# Patient Record
Sex: Female | Born: 1960 | Hispanic: No | Smoking: Current every day smoker
Health system: Southern US, Community
[De-identification: ages and names within clinical notes are randomized; demographics above are authoritative.]

## PROBLEM LIST (undated history)

## (undated) DIAGNOSIS — Z780 Asymptomatic menopausal state: Secondary | ICD-10-CM

## (undated) DIAGNOSIS — I82409 Acute embolism and thrombosis of unspecified deep veins of unspecified lower extremity: Secondary | ICD-10-CM

## (undated) DIAGNOSIS — C3490 Malignant neoplasm of unspecified part of unspecified bronchus or lung: Secondary | ICD-10-CM

## (undated) HISTORY — PX: IVC FILTER PLACEMENT (ARMC HX): HXRAD1551

## (undated) HISTORY — DX: Asymptomatic menopausal state: Z78.0

## (undated) HISTORY — PX: BREAST CYST EXCISION: SHX579

---

## 2012-11-22 ENCOUNTER — Ambulatory Visit: Payer: Self-pay | Admitting: Physical Medicine and Rehabilitation

## 2012-12-20 ENCOUNTER — Inpatient Hospital Stay: Payer: Self-pay | Admitting: Internal Medicine

## 2012-12-20 ENCOUNTER — Ambulatory Visit: Payer: Self-pay | Admitting: Emergency Medicine

## 2012-12-20 LAB — COMPREHENSIVE METABOLIC PANEL
Albumin: 3.9 g/dL (ref 3.4–5.0)
Alkaline Phosphatase: 151 U/L — ABNORMAL HIGH (ref 50–136)
Anion Gap: 7 (ref 7–16)
BUN: 4 mg/dL — ABNORMAL LOW (ref 7–18)
Bilirubin,Total: 0.4 mg/dL (ref 0.2–1.0)
Chloride: 104 mmol/L (ref 98–107)
Creatinine: 0.67 mg/dL (ref 0.60–1.30)
Glucose: 102 mg/dL — ABNORMAL HIGH (ref 65–99)
Osmolality: 276 (ref 275–301)
Potassium: 4.1 mmol/L (ref 3.5–5.1)
Sodium: 140 mmol/L (ref 136–145)
Total Protein: 7.7 g/dL (ref 6.4–8.2)

## 2012-12-20 LAB — CBC
HCT: 40.6 % (ref 35.0–47.0)
HGB: 14 g/dL (ref 12.0–16.0)
MCHC: 34.6 g/dL (ref 32.0–36.0)
MCV: 100 fL (ref 80–100)
Platelet: 228 10*3/uL (ref 150–440)
RBC: 4.05 10*6/uL (ref 3.80–5.20)
RDW: 12 % (ref 11.5–14.5)

## 2012-12-20 LAB — TSH: Thyroid Stimulating Horm: 2.09 u[IU]/mL

## 2012-12-20 LAB — PROTIME-INR: INR: 0.9

## 2012-12-21 LAB — HEPATIC FUNCTION PANEL A (ARMC)
Albumin: 3.3 g/dL — ABNORMAL LOW (ref 3.4–5.0)
Bilirubin, Direct: 0.2 mg/dL (ref 0.00–0.20)
Bilirubin,Total: 0.5 mg/dL (ref 0.2–1.0)
SGOT(AST): 49 U/L — ABNORMAL HIGH (ref 15–37)
SGPT (ALT): 70 U/L (ref 12–78)
Total Protein: 6.5 g/dL (ref 6.4–8.2)

## 2012-12-23 ENCOUNTER — Ambulatory Visit: Payer: Self-pay | Admitting: Physical Medicine and Rehabilitation

## 2012-12-28 ENCOUNTER — Ambulatory Visit: Payer: Self-pay | Admitting: Vascular Surgery

## 2012-12-28 LAB — BASIC METABOLIC PANEL WITH GFR
Anion Gap: 8
BUN: 7 mg/dL
Calcium, Total: 8.9 mg/dL
Chloride: 109 mmol/L — ABNORMAL HIGH
Co2: 25 mmol/L
Creatinine: 0.64 mg/dL
EGFR (African American): >60
EGFR (Non-African Amer.): >60
Glucose: 98 mg/dL
Osmolality: 281
Potassium: 3.5 mmol/L
Sodium: 142 mmol/L

## 2013-01-02 ENCOUNTER — Ambulatory Visit: Payer: Self-pay | Admitting: Hematology and Oncology

## 2013-01-20 ENCOUNTER — Ambulatory Visit: Payer: Self-pay | Admitting: Hematology and Oncology

## 2013-01-20 ENCOUNTER — Ambulatory Visit: Payer: Self-pay | Admitting: Physical Medicine and Rehabilitation

## 2013-03-19 ENCOUNTER — Ambulatory Visit: Payer: Self-pay | Admitting: Hematology and Oncology

## 2013-03-20 LAB — CBC CANCER CENTER
Basophil %: 1.1 %
Eosinophil #: 0.2 x10 3/mm (ref 0.0–0.7)
HGB: 15.4 g/dL (ref 12.0–16.0)
Lymphocyte #: 3.3 x10 3/mm (ref 1.0–3.6)
Lymphocyte %: 41.8 %
Monocyte #: 0.5 x10 3/mm (ref 0.2–0.9)
Neutrophil %: 48 %
RBC: 4.69 10*6/uL (ref 3.80–5.20)
WBC: 7.9 x10 3/mm (ref 3.6–11.0)

## 2013-03-20 LAB — COMPREHENSIVE METABOLIC PANEL
Alkaline Phosphatase: 109 U/L (ref 50–136)
Anion Gap: 11 (ref 7–16)
Chloride: 105 mmol/L (ref 98–107)
Co2: 27 mmol/L (ref 21–32)
Creatinine: 0.8 mg/dL (ref 0.60–1.30)
EGFR (Non-African Amer.): >60
Glucose: 108 mg/dL — ABNORMAL HIGH (ref 65–99)
Potassium: 4.1 mmol/L (ref 3.5–5.1)
Sodium: 143 mmol/L (ref 136–145)
Total Protein: 7.5 g/dL (ref 6.4–8.2)

## 2013-03-22 ENCOUNTER — Ambulatory Visit: Payer: Self-pay | Admitting: Hematology and Oncology

## 2013-06-14 ENCOUNTER — Ambulatory Visit: Payer: Self-pay | Admitting: Hematology and Oncology

## 2013-06-19 LAB — BASIC METABOLIC PANEL
Anion Gap: 10 (ref 7–16)
BUN: 13 mg/dL (ref 7–18)
Creatinine: 0.9 mg/dL (ref 0.60–1.30)
EGFR (African American): >60
EGFR (Non-African Amer.): >60
Osmolality: 285 (ref 275–301)
Sodium: 143 mmol/L (ref 136–145)

## 2013-06-19 LAB — CBC CANCER CENTER
Eosinophil #: 0.1 x10 3/mm (ref 0.0–0.7)
Eosinophil %: 1.9 %
HCT: 38 % (ref 35.0–47.0)
HGB: 13.4 g/dL (ref 12.0–16.0)
Lymphocyte %: 33 %
MCHC: 35.2 g/dL (ref 32.0–36.0)
Monocyte #: 0.5 x10 3/mm (ref 0.2–0.9)
Monocyte %: 8.2 %
Neutrophil #: 3.2 x10 3/mm (ref 1.4–6.5)
Neutrophil %: 55.9 %
Platelet: 210 x10 3/mm (ref 150–440)
RDW: 12.6 % (ref 11.5–14.5)
WBC: 5.8 x10 3/mm (ref 3.6–11.0)

## 2013-06-22 ENCOUNTER — Ambulatory Visit: Payer: Self-pay | Admitting: Hematology and Oncology

## 2013-09-18 ENCOUNTER — Ambulatory Visit: Payer: Self-pay | Admitting: Hematology and Oncology

## 2013-09-18 LAB — CBC CANCER CENTER
Basophil #: 0.1 x10 3/mm (ref 0.0–0.1)
Basophil %: 1.5 %
Eosinophil %: 4.4 %
HGB: 14 g/dL (ref 12.0–16.0)
Lymphocyte #: 1.9 x10 3/mm (ref 1.0–3.6)
Lymphocyte %: 36.7 %
MCV: 102 fL — ABNORMAL HIGH (ref 80–100)
Monocyte #: 0.5 x10 3/mm (ref 0.2–0.9)
Monocyte %: 9.6 %
Neutrophil #: 2.5 x10 3/mm (ref 1.4–6.5)
Neutrophil %: 47.8 %
Platelet: 216 x10 3/mm (ref 150–440)
RBC: 4.12 10*6/uL (ref 3.80–5.20)
RDW: 12 % (ref 11.5–14.5)
WBC: 5.2 x10 3/mm (ref 3.6–11.0)

## 2013-09-18 LAB — BASIC METABOLIC PANEL
BUN: 11 mg/dL (ref 7–18)
Chloride: 104 mmol/L (ref 98–107)
Co2: 24 mmol/L (ref 21–32)
EGFR (Non-African Amer.): >60
Glucose: 122 mg/dL — ABNORMAL HIGH (ref 65–99)
Potassium: 4.1 mmol/L (ref 3.5–5.1)
Sodium: 141 mmol/L (ref 136–145)

## 2013-09-22 ENCOUNTER — Ambulatory Visit: Payer: Self-pay | Admitting: Hematology and Oncology

## 2015-03-14 NOTE — Consult Note (Signed)
Brief Consult Note: Diagnosis: extensive DVT left Lower extremity.   Comments: Would recommend routine age appropriate screening for malignancy including mammogram,colonoscopy and agree with CT abdomen as liver function tests continue to be abnormal. also agree with hypercoaguable workup including Factor V leiden,Prothrombin gene mutation and Lupus anticoagulant. Consult appreciated.  Electronic Signatures: Georges Mouse (MD)  (Signed 30-Jan-14 16:05)  Authored: Brief Consult Note   Last Updated: 30-Jan-14 16:05 by Georges Mouse (MD)

## 2015-03-14 NOTE — Consult Note (Signed)
CHIEF COMPLAINT and HISTORY:   Subjective/Chief Complaint left leg pain/swelling    History of Present Illness Patient presents with 5 day history of pain and swelling of the left leg.  No antecedent trauma/long trips/personal or family history of thrombotic issues.  No right leg symptoms.  Has no significant improvement with two doses of lovenox.  US shows extensive DVT LLE throughout visualized veins.    Past History none   ALLERGIES:  Allergies:  Penicillin: Hives  HOME MEDICATIONS:  Home Medications: Medication Instructions Status  ibuprofen 200 mg oral tablet 1-2 tab(s) orally every 4 to 6 hours, As Needed- for Pain  Active  multivitamin 1 tab(s) orally once a day Active  Flax Seed Oil oral capsule 1 cap(s) orally once a day Active  Vitamin D3 1 tab(s) orally once a day Active   Family and Social History:   Family History Non-Contributory  no history of clotting issues    Social History negative ETOH, negative Illicit drugs    Place of Living Home   Review of Systems:   Subjective/Chief Complaint left leg pain/swelling    Fever/Chills No    Cough No    Sputum No    Abdominal Pain No    Diarrhea No    Constipation No    Nausea/Vomiting No    SOB/DOE No    Chest Pain No    Dysuria No    Tolerating Diet Yes    Medications/Allergies Reviewed Medications/Allergies reviewed   Physical Exam:   GEN well developed, well nourished, no acute distress    HEENT pink conjunctivae, hearing intact to voice    NECK No masses    RESP normal resp effort  no use of accessory muscles    CARD regular rate  no JVD    ABD denies tenderness  denies Flank Tenderness    LYMPH negative neck, negative axillae    EXTR 2+ LLE edema.  Edema present up to the thigh    SKIN normal to palpation, skin turgor good    NEURO motor/sensory function intact    PSYCH A+O to time, place, person    Additional Comments normal pedal pulses bilaterally   LABS:  Laboratory  Results: Thyroid:    29-Jan-14 17:40, Thyroid Stimulating Hormone   Thyroid Stimulating Hormone 2.09   0.45-4.50  (International Unit)   -----------------------  Pregnant patients have   different reference   ranges for TSH:   - - - - - - - - - -   Pregnant, first trimetser:   0.36 - 2.50 uIU/mL  Hepatic:    29-Jan-14 17:40, Comprehensive Metabolic Panel   Bilirubin, Total 0.4   Alkaline Phosphatase 151   SGPT (ALT) 96   SGOT (AST) 98   Total Protein, Serum 7.7   Albumin, Serum 3.9    30-Jan-14 06:14, Hepatic Function Panel A   Bilirubin, Total 0.5   Bilirubin, Direct 0.2   Result(s) reported on 21 Dec 2012 at 06:53AM.   Alkaline Phosphatase 121   SGPT (ALT) 70   SGOT (AST) 49   Total Protein, Serum 6.5   Albumin, Serum 3.3  Routine Chem:    29-Jan-14 17:40, Comprehensive Metabolic Panel   Glucose, Serum 102   BUN 4   Creatinine (comp) 0.67   Sodium, Serum 140   Potassium, Serum 4.1   Chloride, Serum 104   CO2, Serum 29   Calcium (Total), Serum 9.0   Osmolality (calc) 276   eGFR (African American) >60   eGFR (  Non-African American) >60   eGFR values <56m/min/1.73 m2 may be an indication of chronic  kidney disease (CKD).  Calculated eGFR is useful in patients with stable renal function.  The eGFR calculation will not be reliable in acutely ill patients  when serum creatinine is changing rapidly. It is not useful in   patients on dialysis. The eGFR calculation may not be applicable  to patients at the low and high extremes of body sizes, pregnant  women, and vegetarians.   Anion Gap 7  Routine Coag:    29-Jan-14 17:40, Activated PTT   Activated PTT (APTT) 25.2   A HCT value >55% may artifactually increase the APTT. In one study,  the increase was an average of 19%.  Reference: "Effect on Routine and Special Coagulation Testing Values  of Citrate Anticoagulant Adjustment in Patients with High HCT Values."  American Journal of Clinical Pathology  2006;126:400-405.    29-Jan-14 17:40, Prothrombin Time   Prothrombin 12.9   INR 0.9   INR reference interval applies to patients on anticoagulant therapy.  A single INR therapeutic range for coumarins is not optimal for all  indications; however, the suggested range for most indications is  2.0 - 3.0.  Exceptions to the INR Reference Range may include: Prosthetic heart  valves, acute myocardial infarction, prevention of myocardial  infarction, and combinations of aspirin and anticoagulant. The need  for a higher or lower target INR must be assessed individually.  Reference: The Pharmacology and Management of the Vitamin K   antagonists: the seventh ACCP Conference on Antithrombotic and  Thrombolytic Therapy. CEZMOQ.9476Sept:126 (3suppl): [id number]  A HCT value >55% may artifactually increase the PT.  In one study,   the increase was an average of 25%.  Reference:  "Effect on Routine and Special Coagulation Testing Values  of Citrate Anticoagulant Adjustment in Patients with High HCT Values."  American Journal of Clinical Pathology 2006;126:400-405.  Routine Hem:    29-Jan-14 17:40, Hemogram, Platelet Count   WBC (CBC) 10.9   RBC (CBC) 4.05   Hemoglobin (CBC) 14.0   Hematocrit (CBC) 40.6   Platelet Count (CBC) 228   Result(s) reported on 20 Dec 2012 at 05:53PM.   MCV 100   MCH 34.7   MCHC 34.6   RDW 12.0   RADIOLOGY:  Radiology Results: XRay:    29-Jan-14 21:16, Chest PA and Lateral   Chest PA and Lateral   REASON FOR EXAM:    dvt left leg  COMMENTS:       PROCEDURE: DXR - DXR CHEST PA (OR AP) AND LATERAL  - Dec 20 2012  9:16PM     RESULT: The lungs are hyperinflated. There is no focal infiltrate. There   is no pneumothorax or pneumomediastinum. The cardiac silhouette is normal   in size. The mediastinum is normal in width.    IMPRESSION:  There is hyperinflation consistent with COPD. There is no   evidence of CHF nor pneumonia. There are no findings to suggest a    pulmonary infarction.     Dictation Site: 5    Verified By: DAVID A. JMartinique M.D., MD  [place]    29-Jan-14 16:47, [place] Flow Doppler Low Extrem Left   [place] Flow Doppler Low Extrem Left   REASON FOR EXAM:    CALL [id number] Left leg pain and swelling   Eval for DVT Dr RJoneen Caraway...  COMMENTS:       PROCEDURE: [place] - [place]OPPLER LOW  Princella Ion LEFT  - Dec 20 2012  4:47PM     RESULT: Left lower extremity deep venous Doppler ultrasound dated   12/20/2012.    Technique: Real time sonographic imaging of the deep venous structures of   the left lower extremity was obtained. Representative static images were   provided for interpretation.    Findings: Diffuse noncompressible increased echogenicity is appreciated   within the common femoral vein extending into the saphenous vein,     superficial femoral vein as well as the popliteal vein. There is distal   extension into the tibial and peroneal veins. There is no evidence of   color filling within these vessels nor evidence of an appreciable   waveform.    IMPRESSION:  Extensive occlusive thrombus within the interrogated deep   venous structures of the left lower extremity. Dr. Joneen Caraway the patient's   attending physician was informed of these findings at 4:47 p.m. On   12/20/2012. (*)        Verified By: Mikki Santee, M.D., MD  LabUnknown:   PACS Image    29-Jan-14 21:16, Chest PA and Lateral   PACS Image   ASSESSMENT AND PLAN:   Assessment/Admission Diagnosis extensive LLE DVT, no previous history    Plan Discussed multiple options for treatment.   Highly likely to have post-phlebitic problems with pain and swelling long term given significant symtpoms and severe clot burden.  Discussed that catheter directed thrombolysis and thrombectomy would likley reduce these symptoms by about 50%.  Risks of bleeding, renal failure, access complications discussed.  Would place IVC filter at that time.  Have up to two to three weeks  from start of symptoms to perform this procedure, and she is interested in having this done next week.  Will arrange as an outpatient.  Continue full anticoagulation.   Electronic Signatures: Algernon Huxley (MD)  (Signed 30-Jan-14 16:33)  Authored: Chief Complaint and History, ALLERGIES, HOME MEDICATIONS, Family and Social History, Review of Systems, Physical Exam, LABS, RADIOLOGY, Assessment and Plan   Last Updated: 30-Jan-14 16:33 by Algernon Huxley (MD)

## 2015-03-14 NOTE — Discharge Summary (Signed)
PATIENT NAME:  Christina Mckee, Christina Mckee MR#:  [id number] DATE OF BIRTH:  [birth year]  DATE OF ADMISSION:  12/20/2012 DATE OF DISCHARGE:  12/22/2012  ADMITTING DIAGNOSIS: Deep vein thrombosis.  DISCHARGE DIAGNOSES:  1. Extensive left lower extremity deep vein thrombosis status post IVC filter placement by Dr. Delana Meyer on the 31st of January 2014.   2. Elevated transaminases of unclear etiology at this time. Unremarkable CT of abdomen and pelvis. Most likely hepatic hemangioma. 3. Hyperglycemia with hemoglobin A1c 5.4. 4. Anxious state.   DISCHARGE CONDITION: Stable.   DISCHARGE MEDICATIONS:  1. Multivitamins once daily. 2. Flaxseed oil 1 capsule once daily. 3. Vitamin D3 unknown dose, 1 tablet once daily. 4. Xarelto 15 p.o. twice daily for 20 days and then 20 mg daily until completing 3 month therapy.   NOTE: The patient is not to take ibuprofen.   HOME OXYGEN: None.   DIET: Regular, regular consistency.   ACTIVITY LIMITATIONS: As tolerated.   FOLLOWUP APPOINTMENTS: With [facility] in 2 days after discharge. Also Dr. Lucky Cowboy in 1 week after discharge and Dr. Kallie Edward in 1 week after discharge.   CONSULTANTS:  1. Hortencia Pilar, MD. 2. Care Management.  3. Leotis Pain, MD. 4. Kennieth Francois, MD.  RADIOLOGIC STUDIES: Chest, PA and lateral, 29th of January 2014, showed hyperinflation consistent with COPD. No evidence of CHF or pneumonia. There are no findings to suggest pulmonary infarction.  Ultrasound of left lower extremity, 29th of January 2014, revealed extensive occluded thrombus within the internal deep vein structures, of the left lower extremity, including common femoral vein extending to saphenous vein, superficial femoral vein as well as popliteal vein. There is distal extension into the tibial and peroneal veins.  CT scan of abdomen and pelvis with contrast, 30th of January 2014, due to elevated transaminases as well as DVT in young person, revealed abnormal appearance of left  inguinal region and proximal lower extremity deep vein structures, on the left. Correlate for thrombophlebitis. No focal hepatic mass or ductal dilatation evident aside from what appears to be possible hemangioma.  HISTORY OF PRESENT ILLNESS: The patient is a 54 year old Caucasian female with no significant past medical history who presented to the hospital on the 29th of January 2014 with left lower extremity pain as well as swelling. Please refer to Dr. Edward Jolly admission note on the 29th of January 2014.  On arrival to the hospital, the patient's temperature was 98.7, pulse was 108, respiratory rate was 20, blood pressure 135/84 and saturation was 98% on room air. Physical examination was unremarkable, except of left lower extremity pitting edema, approximately 1 to 2+. Good peripheral pulses were noted.   The patient's lab data done on the 29th of January 2014 revealed mild elevation of glucose to 102. Otherwise BMP was normal. The patient's liver enzymes showed alkaline phosphatase elevated to 151. AST as well as ALT were 98 and 96, respectively. TSH was normal at 2.09. CBC: White blood cell count was 10.9, hemoglobin was 14.0 and platelet count 228. Coagulation panel was unremarkable. The patient's EKG showed normal sinus rhythm at 99 beats per minute, normal axis, no acute ST-T changes were noted. The patient had ultrasound of her lower extremity which showed extensive DVT.   HOSPITAL COURSE: She was admitted to the hospital for further evaluation. She was evaluated by Dr. Kallie Edward, oncologist, as well as Dr. Leotis Pain. Dr. Leotis Pain felt that the patient would benefit from thrombectomy and he recommended followup with him in the next one week  after discharge. However, the patient was very concerned about her left lower extremity DVT and she was pressed by her family members to get IVC filter for which she was very much agreeable. Dr. Delana Meyer evaluated the patient later in her stay, on 31st of January  2014, and she had IVC filter placed with no significant complications. She was discharged on the same day, 31st of January 2014, in stable condition with the above-mentioned medications and followup. Her vital signs on the day of discharge: Temperature was 97.8, pulse was 80, respiratory rate was 18, blood pressure 105/70 and saturation 98% on room air at rest.   In regards to elevated transaminases, the patient had CT scan of abdomen and pelvis, however, that was unremarkable, except for hepatic hemangioma. The patient's liver enzymes were rechecked the next day and she was noted to have mild elevation of AST of unclear etiology. It is recommended to follow the patient's liver enzymes and rule out alcohol abuse issues.   The patient was noted to be hyperglycemic on her initial arrival to the hospital labs. Hemoglobin A1c was checked and was found to be 5.4. The patient was evaluated by oncologist in regards to her hypercoagulable state. She had multiple studies done such as anticardiolipin antibodies, beta 2 glycoprotein, as well as hepatitis panels A, B and C, as well as CNS functional testing. However, those results were not available at the time of the patient's discharge. Now we have results only from hepatitis panels A, B and C, which were negative. Protein S functional was 71%. The patient is to follow up with Dr. Kallie Edward in regards to management of her questionable hypercoagulable state.   TIME SPENT: 40 minutes. ____________________________ Theodoro Grist, MD rv:sb D: 12/24/2012 18:45:00 ET T: 12/25/2012 10:00:25 ET JOB#: [id number]  cc: Theodoro Grist, MD, <Dictator> [facility] Algernon Huxley, MD Rae Halsted. Kallie Edward, MD Theodoro Grist MD ELECTRONICALLY SIGNED 01/11/2013 19:13

## 2015-03-14 NOTE — Op Note (Signed)
PATIENT NAME:  Christina Mckee, HOOT MR#:  [id number] DATE OF BIRTH:  10-Nov-1961  DATE OF PROCEDURE:  12/28/2012  PREOPERATIVE DIAGNOSES:  1.  Extensive left lower extremity deep vein thrombosis.  2.  Status post inferior vena cava filter placement.   POSTOPERATIVE DIAGNOSES: 1.  Extensive left lower extremity deep vein thrombosis.  2.  Status post inferior vena cava filter placement.   PROCEDURE:  1.  Ultrasound guidance for vascular access, left popliteal vein.  2.  Catheter placement into inferior vena cava from left popliteal vein.  3.  Left lower extremity venogram and inferior venacavogram.  4.  Infusion of a total of 16 mg of TPA with the trial Trellis system in the left superficial femoral vein, common femoral vein, external and common iliac veins and inferior vena cava.  5.  Venous thrombectomy with the Trellis and AngioJet systems to same veins.  6.  Percutaneous transluminal angioplasty with 10 mm diameter angioplasty balloon in the left common femoral vein, external and common iliac veins and inferior vena cava.   SURGEON: Algernon Huxley, M.D.   ANESTHESIA: Local with moderate conscious sedation.   ESTIMATED BLOOD LOSS: Minimal.   FLUOROSCOPY TIME: Approximately 9 minutes with 14 mL of contrast used.   INDICATION FOR PROCEDURE: The patient has extensive left lower extremity DVT with severe pain and swelling. Venous thrombolysis performed to help improve her symptoms from her DVT both in the immediate time and long-term. Risks and benefits were discussed. Informed consent was obtained.   DESCRIPTION OF PROCEDURE: The patient is brought to the vascular interventional radiology suite. The left lower extremity was sterilely prepped and draped and a sterile surgical field was created. She was placed prone and her left popliteal vein was visualized with ultrasound found with thrombosed and was found to be thrombosed. It was able to be accessed without difficulty with a micropuncture needle,  micropuncture wire and sheath were placed. Imaging showed thrombosis of the popliteal superficial femoral veins without much blood flow proximally. I was able to pass a wire and catheter up into the inferior vena cava where inferior venacavogram was performed. This demonstrated thrombus into the inferior vena cava. There was thrombus throughout the common femoral and iliac venous system as well. The Magic torque wire was placed. The patient was given 3000 units of intravenous heparin. The Trellis system was then brought onto the field. Two runs of 8 mg of TPA were used; the first run was from the left common femoral vein to the inferior vena cava using the Trellis thrombectomy system and deployed in TPA. It was then pulled back and encompassed the left common femoral vein and then the proximal mid superficial femoral vein 8 more mg of TPA were infused in this location. The Trellis system was used. This showed improvement of flow, but still significant thrombus and stenosis present. The most difficult area across was in the left iliac vein. This may be May-Thurner situation, although it was difficult to tell. Balloon angioplasty was performed with a 10 mm diameter angioplasty balloon from the left common femoral vein up into the inferior vena cava and then the AngioJet proxy system was used from the superficial femoral vein up to the vena cava. At this point, there was a channel of blood with markedly improved flow. There were still some residual thrombus and this was significantly better than prior and I elected to terminate the procedure. The sheath was removed. Pressure was held. Sterile dressing was placed. The patient tolerated the  procedure well and was taken to the recovery room in stable condition. .    ____________________________ Algernon Huxley, MD jsd:aw D: 12/28/2012 10:25:12 ET T: 12/28/2012 10:32:56 ET JOB#: [id number]  cc: Algernon Huxley, MD, <Dictator> Algernon Huxley MD ELECTRONICALLY SIGNED  01/08/2013 16:56

## 2015-03-14 NOTE — H&P (Signed)
PATIENT NAME:  Christina Mckee, Christina Mckee MR#:  [id number] DATE OF BIRTH:  1961-10-13  DATE OF ADMISSION:  12/20/2012  PRIMARY CARE PHYSICIAN: Does not have one.   CHIEF COMPLAINT: Left lower extremity pain and swelling.   HISTORY OF PRESENT ILLNESS: This is a 54 year old female who presents to the hospital with 4- to 5-day history of left lower extremity pain and swelling. The patient thought it was related to some cramps or pulled muscle. She tried taking some ibuprofen and some muscle relaxants. Although it was not improving her symptoms, she even tried taking Epsom salts baths, but it did not improve her symptoms. She, therefore, came to the ER, underwent a left lower extremity ultrasound, which showed an acute DVT. Hospitalist services were contacted for further treatment and evaluation. The patient denies any chest pain. She denies any shortness of breath, any nausea, vomiting, headache, fevers, chills, cough or any other associated symptoms presently.   REVIEW OF SYSTEMS: CONSTITUTIONAL: No documented fever. No weight gain or weight loss.  EYES:  No blurry or double vision.  ENT: No tinnitus. No postnasal drip. No redness of oropharynx.  RESPIRATORY: No cough. No wheeze. No hemoptysis.  CARDIOVASCULAR: No chest pain. No orthopnea. No palpitations. No syncope.  GASTROINTESTINAL: No nausea, vomiting or diarrhea. No abdominal pain. No melena. No hematochezia.  GENITOURINARY: No dysuria or hematuria.  ENDOCRINE: No polyuria or nocturia. No heat or cold intolerance. HEMATOLOGIC:  No anemia. No bruising. No bleeding.  INTEGUMENTARY: No rashes. No lesions.  MUSCULOSKELETAL: No arthritis. No swelling. No gout.  NEUROLOGIC: No numbness or tingling. No ataxia. No seizure-type activity.  PSYCHIATRIC: No anxiety. No insomnia. No ADD.   PAST MEDICAL HISTORY:  The patient has no significant past medical history.   ALLERGIES: PENICILLIN, WHICH CAUSES HIVES.   SOCIAL HISTORY: She does smoke about a half a  pack per day, has been smoking for the past 20+ years. Occasional alcohol use. No illicit drug abuse. Lives at home with her children.   FAMILY HISTORY: Father is alive, just has high blood pressure. Mother died from complications of breast cancer.   CURRENT MEDICATIONS:  She is currently taking multivitamin daily, flaxseed oil 1 tab daily, vitamin D3 at 400 international units daily and ibuprofen 200 mg 1 to 2 tabs every 4 hours as needed.   PHYSICAL EXAMINATION: VITAL SIGNS: Temperature 98.7, pulse 108, respirations 20, blood pressure 135/84, saturations 98% on room air.  GENERAL: She is a pleasant but anxious-appearing female in no apparent distress.  HEENT: Atraumatic, normocephalic. Extraocular muscles are intact. Pupils equal, reactive to light. Sclerae is anicteric. No conjunctival injection. No pharyngeal erythema.  NECK: Supple. There is no jugular venous distention. No bruits. No lymphadenopathy. No thyromegaly.  HEART: Regular rate and rhythm, tachycardic. No murmurs, rubs or clicks.  LUNGS: Clear to auscultation bilaterally. No rales. No rhonchi. No wheezes.  ABDOMEN: Soft, flat, nontender, nondistended. Has good bowel sounds. No hepatosplenomegaly appreciated.  EXTREMITIES: No evidence of any cyanosis or clubbing. She does have +1 to 2 pitting edema on the left lower extremity as compared to the right. The left lower extremity is a bit warmer to touch than the right. She has +2 pedal and radial pulses bilaterally.  NEUROLOGIC: She is alert, awake, oriented x3 with no focal motor or sensory deficits appreciated bilaterally.  SKIN: Moist, warm, with no rashes appreciated.  LYMPHATIC: There is no cervical or axillary lymphadenopathy.  BREASTS:  Did not show any evidence of focal masses or nodularity or any  evidence of any deformity.   LABORATORY DATA: Serum glucose 102, BUN 4, creatinine 0.6, sodium 140, potassium 4.1, chloride 104, bicarbonate 29, albumin 3.9, alkaline phosphatase 151,  AST 98, ALT 96. Her white cell count is 10.9, hemoglobin 14, hematocrit 40.6, platelet count 228, INR 0.9.   The patient did have an ultrasound of the left lower extremity, which showed extensive occlusive thrombus within interrogated deep venous structures to left lower extremity.   ASSESSMENT AND PLAN: This is a 54 year old female, with no significant past medical history, who was not seen a doctor in many years, presents to the hospital with left lower extremity swelling and pain and noted to have an acute left lower extremity deep vein thrombosis.  1.  Left lower extremity deep vein thrombosis. Her risk factors presently are currently unclear. She has not been on a long car or plane ride. She has no history of malignancy. She does smoke. The only risk factor is her being somewhat on a laptop for hours searching for a job. Has not had any history of spontaneous abortions during her early childhood. I will go ahead and check a thrombophilia workup for now. We will start her on Lovenox. She likely will need to be on treat anywhere from 3 to 6 months. She does not have a primary care physician; this will likely need to be set up through case management prior to discharge.  2.  Elevated liver function tests. I will go ahead and check a hepatitis profile. Repeat her LFTs in the morning.   CODE:  The patient is a full code.   TIME SPENT ON ADMISSION: 45 minutes.     ____________________________ Belia Heman. Verdell Carmine, MD vjs:cc D: 12/20/2012 21:13:00 ET T: 12/20/2012 22:20:42 ET JOB#: [id number]  cc: Belia Heman. Verdell Carmine, MD, <Dictator> Henreitta Leber MD ELECTRONICALLY SIGNED 12/23/2012 8:12

## 2015-03-14 NOTE — Consult Note (Signed)
History of Present Illness:   Reason for Consult Extensive idiopathic thrombosis LLE    Date of Diagnosis 18-Dec-2012    HPI   this is a 54 year old female with a newly diagnosed extensive left lower extremity thrombosis with no obvious risk factors.  No history of preceding trauma and no history of previous DVT.  Patient does not smoke.  She has been sitting at her computer for long periods of time over the past few months while she has been job hunting.family history of bleeding or clotting disorders.medical history of note.  Patient has not had any routine health screening and she has not had any medical insurance.  No colonoscopy or mammogram has been done.  Has not had any recent Pap smears  PFSH:   Family History positive, mother died of breast cancer    Social History negative alcohol, positive tobacco, 1/2 pack per day   Review of Systems:   General pain    Performance Status (ECOG) 2    Extremities swelling  pain  LLE    Psych no complaints   NURSING NOTES: **Vital Signs.:   31-Jan-14 04:35    Vital Signs Type: Routine    Temperature Temperature (F): 98.9    Temperature Source: Oral    Pulse Pulse: 91    Respirations Respirations: 20    Systolic BP Systolic BP: 96    Diastolic BP (mmHg) Diastolic BP (mmHg): 56    Mean BP: 69    Pulse Ox % Pulse Ox %: 97    Pulse Ox Activity Level: At rest    Oxygen Delivery: Room Air/ 21 %   Physical Exam:   General middle aged female in no acute distress    HEENT: normal    Lungs: clear    Cardiac: regular rate, rhythm    Breast: not examined    Abdomen: nontender  positive bowel sounds    Skin: intact    Extremities: edema  swollen LLE    Neuro: AAOx3    Psych: normal appearance  alert and cooperative    Penicillin: Hives  Radiology Results: XRay:    29-Jan-14 21:16, Chest PA and Lateral   Chest PA and Lateral    REASON FOR EXAM:    dvt left leg  COMMENTS:       PROCEDURE: DXR - DXR CHEST PA  (OR AP) AND LATERAL  - Dec 20 2012  9:16PM     RESULT: The lungs are hyperinflated. There is no focal infiltrate. There   is no pneumothorax or pneumomediastinum. The cardiac silhouette is normal   in size. The mediastinum is normal in width.    IMPRESSION:  There is hyperinflation consistent with COPD. There is no   evidence of CHF nor pneumonia. There are no findings to suggest a   pulmonary infarction.     Dictation Site: 5    Verified By: DAVID A. Martinique, M.D., MD  [place]:    29-Jan-14 16:47, [place] Color Flow Doppler Low Extrem Left   [place] Color Flow Doppler Low Extrem Left    REASON FOR EXAM:    CALL [id number]  Left leg pain and swelling   Eval for DVT Dr Joneen Caraway ...  COMMENTS:       PROCEDURE: [place]  - US DOPPLER LOW EXTR LEFT  - Dec 20 2012  4:47PM     RESULT: Left lower extremity deep venous Doppler ultrasound dated   12/20/2012.    Technique: Real time sonographic imaging of the  deep venous structures of   the left lower extremity was obtained. Representative static images were   provided for interpretation.    Findings: Diffuse noncompressible increased echogenicity is appreciated   within the common femoral vein extending into the saphenous vein,     superficial femoral vein as well as the popliteal vein. There is distal   extension into the tibial and peroneal veins. There is no evidence of   color filling within these vessels nor evidence of an appreciable   waveform.    IMPRESSION:  Extensive occlusive thrombus within the interrogated deep   venous structures of the left lower extremity. Dr. Joneen Caraway the patient's   attending physician was informed of these findings at 4:47 p.m. On   12/20/2012. (*)        Verified By: Mikki Santee, M.D., MD  CT:    30-Jan-14 18:22, CT Abdomen and Pelvis With Contrast   CT Abdomen and Pelvis With Contrast    REASON FOR EXAM:    (1) elevated transaminases, r/o abnormalities,   resembling masses, as pt is in hy  COMMENTS:        PROCEDURE: CT  - CT ABDOMEN / PELVIS  W  - Dec 21 2012  6:22PM     RESULT: CT of the abdomen and pelvis is performed utilizing 116m of   Isovue-300 iodinated intravenous contrast and oral contrast with images   reconstructed at 3 mm slice thickness in the axial plane.    The lung bases are clear. There is no pleural or pericardial effusion.   There is abnormal ill-defined density in the left inguinal region   adjacent to the artery and vein. Correlate for underlying left lower   extremity deep vein thrombosis or thrombophlebitis. Left lower extremity   Doppler interrogation may be beneficial.  There is no ductal dilation. There is a enhancing area in the right   hepatic lobe which may or present a hemangioma given the pattern of   enhancement. This measures 7 mm on image 19. No postcontrast delayed   images were performed.  The common bile duct is unremarkable. The   gallbladder is nondistended. No pancreatic mass or ductal dilation is   evident. The spleen enhances normally and is not enlarged. The kidneys   show no obstruction. The adrenal glands are unremarkable. The abdominal   aorta is normal in caliber. Thereis no mesenteric or retroperitoneal   mass or adenopathy. A normal appearing retrocecal appendix is   demonstrated. There is no evidence of abscess or ascites. The uterus   appears to be unremarkable. No adnexal masses appreciated. The urinary   bladder appears unremarkable for the somewhat low volume of distention.   The small bowel is unremarkable.    IMPRESSION:   1. Abnormal appearance in the left inguinal region and proximal lower   extremity deep venous structures on the left. Correlate forof   thrombophlebitis.  2. No focal hepatic mass or ductal dilation evident aside from what   appears to be a possible hemangioma as mentioned above.    Dictation Site: 1        Verified By: GSundra Aland M.D., MD       Oncology Consult, ROUTINE - requires  response within 24 hours  Notify-on call  Reason for-yound woman with extensive DVT  Note:notified office 12/21/12 1424. GLF  Message to MD-Consult given to Dr. RKallie Edward1/30/2014 at 2:30pm  Notified Physician Office on 21-Dec-2012 at 14:24  RGeorges Mouseadded  as a consulting care provider, 21-Dec-2012, Pending, Standard  Assessment and Plan:  Impression:   this is a 10-old female with extensive  idiopathic left lower extremity clot.  Plan:   In terms of  extensive clot burden patient has been evaluated by vascular surgery and will undergo thrombectomy within the next week.terms of anticoagulation,patient on Xarelto. May need to be on Lovenox prior to surgical procedure.will need a full hypercoagulable workup.  She has had labs drawn for factor V Leiden prothrombin  gene mutation and lupus anticoagulant.will also need routine health  screening including colonoscopy and mammogram.scan also showed a left inguinal density which should be further evaluated.will follow outpatient on discharge.appreciated  Electronic Signatures: Georges Mouse (MD)  (Signed 31-Jan-14 13:22)  Authored: HISTORY OF PRESENT ILLNESS, PFSH, ROS, NURSING NOTES, PE, ALLERGIES, HOME MEDICATIONS, OTHER RESULTS, ORDERS, ASSESSMENT AND PLAN   Last Updated: 31-Jan-14 13:22 by Georges Mouse (MD)

## 2015-03-14 NOTE — Op Note (Signed)
PATIENT NAME:  Christina Mckee, SIGNER MR#:  [id number] DATE OF BIRTH:  Mar 09, 1961  DATE OF PROCEDURE:  12/22/2012  PREOPERATIVE DIAGNOSES: Deep venous thrombosis, left lower extremity.   POSTOPERATIVE DIAGNOSIS: Deep venous thrombosis, left lower extremity.   PROCEDURES PERFORMED:  1. Inferior venacavogram.  2. Placement of infrarenal inferior vena caval filter.   PROCEDURE PERFORMED BY: Katha Cabal, MD.   SEDATION Versed 2 mg plus fentanyl 50 mcg administered IV. Continuous ECG, pulse oximetry and cardiopulmonary monitoring is performed throughout the entire procedure by the Interventional Radiology nurse. Total sedation time was 30 minutes.   ACCESS: A 9.5 French sheath, right common femoral vein.   FLUOROSCOPY TIME: Less than 1 minute.   CONTRAST USED: Isovue 20 mL.   INDICATIONS: The patient is a 54 year old woman who had sustained a left lower extremity DVT. She will undergo lysis next week and is undergoing placement of an IVC filter at this time. The risks and benefits were reviewed. All questions are answered. The patient agrees to proceed.   DESCRIPTION OF PROCEDURE: The patient is taken to Special Procedures and placed in the supine position. After adequate sedation is achieved, the right groin is prepped and draped in a sterile fashion. Ultrasound is placed in a sterile sleeve. Ultrasound is utilized secondary to lack of appropriate landmarks and to avoid vascular injury. Under direct ultrasound visualization, the femoral vein is identified. It is echolucent, homogeneous, indicating patency. Image is recorded for the permanent record. Then 1% lidocaine is then infiltrated into the soft tissue and Seldinger needle is utilized to access the vein, J-wire is advanced. The sheath is then advanced, and a hand injection of contrast is utilized to demonstrate the inferior vena cava. After review of the images, there are no filling defects. The cava measures approximately 20 mm and a Meridian  filter is deployed at the L2 level in excellent orientation. The sheath is pulled, pressure is held, and a safeguard is applied. There is no immediate complication.   INTERPRETATION: Inferior vena cava is opacified with a bolus injection of contrast. Reflux of contrast into the renal veins is noted. They are located at the top of the L2 level. The filter is deployed at the lower margin of L2 level.  ____________________________ Katha Cabal, MD ggs:jm D: 12/22/2012 18:28:25 ET T: 12/23/2012 15:10:15 ET JOB#: [id number]  cc: Katha Cabal, MD, <Dictator> Katha Cabal MD ELECTRONICALLY SIGNED 01/08/2013 23:25

## 2016-03-05 ENCOUNTER — Emergency Department: Payer: BLUE CROSS/BLUE SHIELD

## 2016-03-05 ENCOUNTER — Inpatient Hospital Stay
Admission: EM | Admit: 2016-03-05 | Discharge: 2016-03-06 | DRG: 871 | Disposition: A | Discharge status: Home / Self Care | Payer: BLUE CROSS/BLUE SHIELD | Attending: Internal Medicine | Admitting: Internal Medicine

## 2016-03-05 ENCOUNTER — Encounter: Payer: Self-pay | Admitting: Emergency Medicine

## 2016-03-05 DIAGNOSIS — Z800 Family history of malignant neoplasm of digestive organs: Secondary | ICD-10-CM | POA: Diagnosis not present

## 2016-03-05 DIAGNOSIS — R918 Other nonspecific abnormal finding of lung field: Secondary | ICD-10-CM | POA: Diagnosis present

## 2016-03-05 DIAGNOSIS — E876 Hypokalemia: Secondary | ICD-10-CM | POA: Diagnosis present

## 2016-03-05 DIAGNOSIS — I10 Essential (primary) hypertension: Secondary | ICD-10-CM | POA: Diagnosis present

## 2016-03-05 DIAGNOSIS — R6510 Systemic inflammatory response syndrome (SIRS) of non-infectious origin without acute organ dysfunction: Secondary | ICD-10-CM

## 2016-03-05 DIAGNOSIS — Z86718 Personal history of other venous thrombosis and embolism: Secondary | ICD-10-CM | POA: Diagnosis not present

## 2016-03-05 DIAGNOSIS — F17210 Nicotine dependence, cigarettes, uncomplicated: Secondary | ICD-10-CM | POA: Diagnosis not present

## 2016-03-05 DIAGNOSIS — Z880 Allergy status to penicillin: Secondary | ICD-10-CM

## 2016-03-05 DIAGNOSIS — J209 Acute bronchitis, unspecified: Secondary | ICD-10-CM | POA: Diagnosis present

## 2016-03-05 DIAGNOSIS — A419 Sepsis, unspecified organism: Secondary | ICD-10-CM | POA: Diagnosis present

## 2016-03-05 DIAGNOSIS — R000 Tachycardia, unspecified: Secondary | ICD-10-CM

## 2016-03-05 DIAGNOSIS — K760 Fatty (change of) liver, not elsewhere classified: Secondary | ICD-10-CM | POA: Diagnosis present

## 2016-03-05 DIAGNOSIS — Z806 Family history of leukemia: Secondary | ICD-10-CM

## 2016-03-05 DIAGNOSIS — J9600 Acute respiratory failure, unspecified whether with hypoxia or hypercapnia: Secondary | ICD-10-CM | POA: Diagnosis present

## 2016-03-05 DIAGNOSIS — J189 Pneumonia, unspecified organism: Secondary | ICD-10-CM | POA: Diagnosis present

## 2016-03-05 DIAGNOSIS — K449 Diaphragmatic hernia without obstruction or gangrene: Secondary | ICD-10-CM | POA: Diagnosis present

## 2016-03-05 DIAGNOSIS — Z95828 Presence of other vascular implants and grafts: Secondary | ICD-10-CM

## 2016-03-05 DIAGNOSIS — Z803 Family history of malignant neoplasm of breast: Secondary | ICD-10-CM | POA: Diagnosis not present

## 2016-03-05 HISTORY — DX: Acute embolism and thrombosis of unspecified deep veins of unspecified lower extremity: I82.409

## 2016-03-05 LAB — CBC WITH DIFFERENTIAL/PLATELET
BASOS PCT: 1 %
Basophils Absolute: 0.2 10*3/uL — ABNORMAL HIGH (ref 0–0.1)
EOS ABS: 0 10*3/uL (ref 0–0.7)
EOS PCT: 0 %
HCT: 37.1 % (ref 35.0–47.0)
HEMOGLOBIN: 12.4 g/dL (ref 12.0–16.0)
Lymphocytes Relative: 3 %
Lymphs Abs: 0.9 10*3/uL — ABNORMAL LOW (ref 1.0–3.6)
MCH: 31.1 pg (ref 26.0–34.0)
MCHC: 33.4 g/dL (ref 32.0–36.0)
MCV: 93.1 fL (ref 80.0–100.0)
MONOS PCT: 7 %
Monocytes Absolute: 1.8 10*3/uL — ABNORMAL HIGH (ref 0.2–0.9)
NEUTROS PCT: 89 %
Neutro Abs: 24 10*3/uL — ABNORMAL HIGH (ref 1.4–6.5)
PLATELETS: 364 10*3/uL (ref 150–440)
RBC: 3.99 MIL/uL (ref 3.80–5.20)
RDW: 13.1 % (ref 11.5–14.5)
WBC: 26.8 10*3/uL — ABNORMAL HIGH (ref 3.6–11.0)

## 2016-03-05 LAB — URINALYSIS COMPLETE WITH MICROSCOPIC (ARMC ONLY)
BILIRUBIN URINE: NEGATIVE
GLUCOSE, UA: NEGATIVE mg/dL
HGB URINE DIPSTICK: NEGATIVE
KETONES UR: NEGATIVE mg/dL
LEUKOCYTES UA: NEGATIVE
NITRITE: NEGATIVE
PH: 7 (ref 5.0–8.0)
Protein, ur: NEGATIVE mg/dL
RBC / HPF: NONE SEEN RBC/hpf (ref 0–5)
Specific Gravity, Urine: 1.024 (ref 1.005–1.030)
WBC, UA: NONE SEEN WBC/hpf (ref 0–5)

## 2016-03-05 LAB — TROPONIN I

## 2016-03-05 LAB — TSH: TSH: 2.379 u[IU]/mL (ref 0.350–4.500)

## 2016-03-05 LAB — COMPREHENSIVE METABOLIC PANEL
ALBUMIN: 3.5 g/dL (ref 3.5–5.0)
ALK PHOS: 74 U/L (ref 38–126)
ALT: 19 U/L (ref 14–54)
ANION GAP: 15 (ref 5–15)
AST: 28 U/L (ref 15–41)
BUN: 10 mg/dL (ref 6–20)
CHLORIDE: 100 mmol/L — AB (ref 101–111)
CO2: 19 mmol/L — AB (ref 22–32)
Calcium: 9.3 mg/dL (ref 8.9–10.3)
Creatinine, Ser: 0.65 mg/dL (ref 0.44–1.00)
GFR calc non Af Amer: >60 mL/min (ref >60)
GLUCOSE: 155 mg/dL — AB (ref 65–99)
POTASSIUM: 3.4 mmol/L — AB (ref 3.5–5.1)
SODIUM: 134 mmol/L — AB (ref 135–145)
Total Bilirubin: 0.8 mg/dL (ref 0.3–1.2)
Total Protein: 7.1 g/dL (ref 6.5–8.1)

## 2016-03-05 LAB — LIPASE, BLOOD: Lipase: 15 U/L (ref 11–51)

## 2016-03-05 LAB — BRAIN NATRIURETIC PEPTIDE: B NATRIURETIC PEPTIDE 5: 44 pg/mL (ref 0.0–100.0)

## 2016-03-05 LAB — LACTIC ACID, PLASMA: Lactic Acid, Venous: 1.3 mmol/L (ref 0.5–2.0)

## 2016-03-05 MED ORDER — SODIUM CHLORIDE 0.9% FLUSH: 3.0000 mL | Freq: Two times a day (BID) | INTRAVENOUS | Status: DC

## 2016-03-05 MED ORDER — HYDROCODONE-ACETAMINOPHEN 5-325 MG PO TABS
1.0000 | ORAL_TABLET | ORAL | Status: DC | PRN
Start: 2016-03-05 — End: 2016-03-06

## 2016-03-05 MED ORDER — ACETAMINOPHEN 650 MG RE SUPP: 650.0000 mg | Freq: Four times a day (QID) | RECTAL | Status: DC | PRN

## 2016-03-05 MED ORDER — POTASSIUM CHLORIDE CRYS ER 20 MEQ PO TBCR
40.0000 meq | EXTENDED_RELEASE_TABLET | Freq: Once | ORAL | Status: AC
  Administered 2016-03-05: 40 meq via ORAL
  Filled 2016-03-05: qty 2

## 2016-03-05 MED ORDER — ENOXAPARIN SODIUM 40 MG/0.4ML ~~LOC~~ SOLN
40.0000 mg | SUBCUTANEOUS | Status: DC
  Filled 2016-03-05: qty 0.4

## 2016-03-05 MED ORDER — BENZONATATE 100 MG PO CAPS
200.0000 mg | ORAL_CAPSULE | Freq: Three times a day (TID) | ORAL | Status: DC
  Administered 2016-03-05: 18:00:00 200 mg via ORAL
  Filled 2016-03-05 (×2): qty 2

## 2016-03-05 MED ORDER — METOPROLOL TARTRATE 25 MG PO TABS
25.0000 mg | ORAL_TABLET | Freq: Two times a day (BID) | ORAL | Status: DC
  Administered 2016-03-05: 25 mg via ORAL
  Filled 2016-03-05 (×2): qty 1

## 2016-03-05 MED ORDER — GUAIFENESIN-CODEINE 100-10 MG/5ML PO SOLN
5.0000 mL | Freq: Four times a day (QID) | ORAL | Status: DC | PRN
  Administered 2016-03-05: 5 mL via ORAL
  Filled 2016-03-05: qty 5

## 2016-03-05 MED ORDER — SODIUM CHLORIDE 0.9 % IV BOLUS (SEPSIS)
1000.0000 mL | Freq: Once | INTRAVENOUS | Status: AC
  Administered 2016-03-05: 1000 mL via INTRAVENOUS

## 2016-03-05 MED ORDER — SODIUM CHLORIDE 0.9 % IV SOLN
INTRAVENOUS | Status: DC
  Administered 2016-03-05 – 2016-03-06 (×2): via INTRAVENOUS

## 2016-03-05 MED ORDER — ONDANSETRON HCL 4 MG PO TABS: 4.0000 mg | ORAL_TABLET | Freq: Four times a day (QID) | ORAL | Status: DC | PRN

## 2016-03-05 MED ORDER — IOPAMIDOL (ISOVUE-370) INJECTION 76%
100.0000 mL | Freq: Once | INTRAVENOUS | Status: AC | PRN
Start: 2016-03-05 — End: 2016-03-05
  Administered 2016-03-05: 100 mL via INTRAVENOUS

## 2016-03-05 MED ORDER — NICOTINE 21 MG/24HR TD PT24
21.0000 mg | MEDICATED_PATCH | Freq: Every day | TRANSDERMAL | Status: DC
  Administered 2016-03-05: 21 mg via TRANSDERMAL
  Filled 2016-03-05: qty 1

## 2016-03-05 MED ORDER — IPRATROPIUM-ALBUTEROL 0.5-2.5 (3) MG/3ML IN SOLN: 3.0000 mL | Freq: Four times a day (QID) | RESPIRATORY_TRACT | Status: DC | PRN

## 2016-03-05 MED ORDER — ONDANSETRON HCL 4 MG/2ML IJ SOLN: 4.0000 mg | Freq: Four times a day (QID) | INTRAMUSCULAR | Status: DC | PRN

## 2016-03-05 MED ORDER — MORPHINE SULFATE (PF) 2 MG/ML IV SOLN: 2.0000 mg | INTRAVENOUS | Status: DC | PRN

## 2016-03-05 MED ORDER — PANTOPRAZOLE SODIUM 40 MG PO TBEC
40.0000 mg | DELAYED_RELEASE_TABLET | Freq: Every day | ORAL | Status: DC
  Administered 2016-03-05: 40 mg via ORAL
  Filled 2016-03-05: qty 1

## 2016-03-05 MED ORDER — PNEUMOCOCCAL VAC POLYVALENT 25 MCG/0.5ML IJ INJ: 0.5000 mL | INJECTION | INTRAMUSCULAR | Status: DC

## 2016-03-05 MED ORDER — LEVOFLOXACIN IN D5W 750 MG/150ML IV SOLN
750.0000 mg | INTRAVENOUS | Status: DC
  Administered 2016-03-05: 750 mg via INTRAVENOUS
  Filled 2016-03-05 (×2): qty 150

## 2016-03-05 MED ORDER — MOMETASONE FURO-FORMOTEROL FUM 200-5 MCG/ACT IN AERO
2.0000 | INHALATION_SPRAY | Freq: Two times a day (BID) | RESPIRATORY_TRACT | Status: DC
  Filled 2016-03-05: qty 8.8

## 2016-03-05 MED ORDER — ACETAMINOPHEN 325 MG PO TABS: 650.0000 mg | ORAL_TABLET | Freq: Four times a day (QID) | ORAL | Status: DC | PRN

## 2016-03-05 NOTE — Significant Event (Signed)
Patient request information on Advanced Directives following possible cancer diagnosis.  Chaplain provided education, gave forms and reviewed.  Also offered supportive listening, space for patient's story and opportunity for spiritual expression. Will make note for CC chaplain to follow up.

## 2016-03-05 NOTE — Consult Note (Signed)
Pharmacy Antibiotic Note  Christina Mckee is a 55 y.o. female admitted on 03/05/2016 with postobustructive PNA.  Pharmacy has been consulted for levofloxacin dosing.  Plan: levofloxacin '750mg'$  q 24 hours  Height: '5\' 5"'$  (165.1 cm) Weight: 120 lb (54.432 kg) IBW/kg (Calculated) : 57  Temp (24hrs), Avg:98.6 F (37 C), Min:98.6 F (37 C), Max:98.6 F (37 C)   Recent Labs Lab 03/05/16 1137 03/05/16 1337  WBC 26.8*  --   CREATININE 0.65  --   LATICACIDVEN  --  1.3    Estimated Creatinine Clearance: 67.4 mL/min (by C-G formula based on Cr of 0.65).    Allergies  Allergen Reactions  . Penicillins Hives and Other (See Comments)    Has patient had a PCN reaction causing immediate rash, facial/tongue/throat swelling, SOB or lightheadedness with hypotension: No Has patient had a PCN reaction causing severe rash involving mucus membranes or skin necrosis: No Has patient had a PCN reaction that required hospitalization No Has patient had a PCN reaction occurring within the last 10 years: No If all of the above answers are "NO", then may proceed with Cephalosporin use.    Antimicrobials this admission: levofloxacin 4/14 >>    Dose adjustments this admission:   Microbiology results: 4/14 BCx: pending   Thank you for allowing pharmacy to be a part of this patient's care.  Ramond Dial, Pharm.D Clinical Pharmacist  03/05/2016 3:09 PM

## 2016-03-05 NOTE — ED Notes (Signed)
Patient c/o "constant tickle to back of throat, constant cough, and gas".  Patient finished a Z-Pack for URI about 2 weeks ago.

## 2016-03-05 NOTE — H&P (Signed)
[facility] NAME: Sonna Lipsky    MR#:  [id number]  DATE OF BIRTH:  01-Mar-1961  DATE OF ADMISSION:  03/05/2016  PRIMARY CARE PHYSICIAN: No PCP Per Patient   REQUESTING/REFERRING PHYSICIAN: Dr. Delman Kitten  CHIEF COMPLAINT:   Chief Complaint  Patient presents with  . Cough  . Sore Throat    HISTORY OF PRESENT ILLNESS:  Tieasha Larsen  is a 55 y.o. female with a known history of unprovoked DVT in 2014, treated with Xarelto and currently off of anticoagulation presents to the hospital secondary to generalized weakness, dry cough and fatigue going on for almost 2 weeks now. Patient was followed after discharge from her DVT hospitalization in 2014 and was seeing a hematologist at the time. Chest x-ray during that time did not reveal any masses. 2 weeks ago when her symptoms started, she went to urgent care and was started on CPAP. She felt better initially and then her symptoms started getting worse. She was complaining of heartburn and chest pressure, dry cough, generalized weakness and fatigue all the time. Denies any loss of weight. But complains of loss of appetite and loss of sleep. She does continue to smoke about half pack to 1 pack per day and has been smoking for more than 38 years now. CT of the chest here in the emergency room revealed a large left lower lobe mass encasing her lower lobe bronchi and postobstructive pneumonia. She is being admitted for the same. Patient is very adamant about not staying more than a day if not needed and wants to get most workup done as outpatient like biopsy if it cannot be done over the weekend.  PAST MEDICAL HISTORY:   Past Medical History  Diagnosis Date  . DVT (deep venous thrombosis) ([place])     in 2015    PAST SURGICAL HISTORY:   Past Surgical History  Procedure Laterality Date  . Cesarean section    . Ivc filter placement ([facility] hx)    . Breast cyst excision      left breast  removal    SOCIAL HISTORY:   Social History  Substance Use Topics  . Smoking status: Current Every Day Smoker -- 0.50 packs/day    Types: Cigarettes  . Smokeless tobacco: Never Used     Comment: 1/2 PPD to 1PPD  . Alcohol Use: 0.0 oz/week    0 Standard drinks or equivalent per week     Comment: daily about 4 beers/day    FAMILY HISTORY:   Family History  Problem Relation Age of Onset  . Breast cancer Mother   . Breast cancer Maternal Aunt   . Stomach cancer Paternal Uncle   . Leukemia Paternal Uncle     DRUG ALLERGIES:   Allergies  Allergen Reactions  . Penicillins Hives and Other (See Comments)    Has patient had a PCN reaction causing immediate rash, facial/tongue/throat swelling, SOB or lightheadedness with hypotension: No Has patient had a PCN reaction causing severe rash involving mucus membranes or skin necrosis: No Has patient had a PCN reaction that required hospitalization No Has patient had a PCN reaction occurring within the last 10 years: No If all of the above answers are "NO", then may proceed with Cephalosporin use.    REVIEW OF SYSTEMS:   Review of Systems  Constitutional: Positive for chills and malaise/fatigue. Negative for fever and weight loss.  HENT: Negative for ear discharge, ear pain, nosebleeds and tinnitus.  Eyes: Negative for blurred vision, double vision and photophobia.  Respiratory: Positive for cough. Negative for hemoptysis, shortness of breath and wheezing.   Cardiovascular: Negative for chest pain, palpitations, orthopnea and leg swelling.       Chest tightness  Gastrointestinal: Positive for heartburn and abdominal pain. Negative for nausea, vomiting, diarrhea, constipation and melena.  Genitourinary: Negative for dysuria, urgency and frequency.  Musculoskeletal: Negative for back pain and neck pain.  Skin: Negative for rash.  Neurological: Negative for dizziness, sensory change, speech change, focal weakness and headaches.   Endo/Heme/Allergies: Does not bruise/bleed easily.  Psychiatric/Behavioral: Negative for depression.    MEDICATIONS AT HOME:   Prior to Admission medications   Not on File      VITAL SIGNS:  Blood pressure 144/90, pulse 118, temperature 98.6 F (37 C), temperature source Oral, resp. rate 19, height '5\' 5"'$  (1.651 m), weight 54.432 kg (120 lb), SpO2 97 %.  PHYSICAL EXAMINATION:   Physical Exam  GENERAL:  55 y.o.-year-old patient lying in the bed with no acute distress.  EYES: Pupils equal, round, reactive to light and accommodation. No scleral icterus. Extraocular muscles intact.  HEENT: Head atraumatic, normocephalic. Oropharynx and nasopharynx clear.  NECK:  Supple, no jugular venous distention. No thyroid enlargement, no tenderness.  LUNGS: Normal breath sounds bilaterally, scattered wheezing, few upper lobe rhonchi. No rales  or crepitation. No use of accessory muscles of respiration.  CARDIOVASCULAR: S1, S2 normal. No murmurs, rubs, or gallops.  ABDOMEN: Soft, nontender, nondistended. Bowel sounds present. No organomegaly or mass.  EXTREMITIES: No pedal edema, cyanosis, or clubbing.  NEUROLOGIC: Cranial nerves II through XII are intact. Muscle strength 5/5 in all extremities. Sensation intact. Gait not checked.  PSYCHIATRIC: The patient is alert and oriented x 3.  SKIN: No obvious rash, lesion, or ulcer.   LABORATORY PANEL:   CBC  Recent Labs Lab 03/05/16 1137  WBC 26.8*  HGB 12.4  HCT 37.1  PLT 364   ------------------------------------------------------------------------------------------------------------------  Chemistries   Recent Labs Lab 03/05/16 1137  NA 134*  K 3.4*  CL 100*  CO2 19*  GLUCOSE 155*  BUN 10  CREATININE 0.65  CALCIUM 9.3  AST 28  ALT 19  ALKPHOS 74  BILITOT 0.8   ------------------------------------------------------------------------------------------------------------------  Cardiac Enzymes  Recent Labs Lab  03/05/16 1137  TROPONINI <0.03   ------------------------------------------------------------------------------------------------------------------  RADIOLOGY:  Ct Angio Chest Pe W/cm &/or Wo Cm  03/05/2016  CLINICAL DATA:  Chronic cough and chronic abdomen and pelvic pain. The pain is in the upper abdomen, increasing over the past several months. Left leg swelling. Clinical concern for deep venous thrombosis. EXAM: CT ANGIOGRAPHY CHEST CT ABDOMEN AND PELVIS WITH CONTRAST TECHNIQUE: Multidetector CT imaging of the chest was performed using the standard protocol during bolus administration of intravenous contrast. Multiplanar CT image reconstructions and MIPs were obtained to evaluate the vascular anatomy. Multidetector CT imaging of the abdomen and pelvis was performed using the standard protocol during bolus administration of intravenous contrast. CONTRAST:  100 cc Isovue 370 COMPARISON:  Abdomen and pelvis CT dated 11/24/2012. Bilateral lower extremity venous Doppler examination obtained earlier today. FINDINGS: CTA CHEST FINDINGS Normally opacified pulmonary arteries with no pulmonary arterial filling defects seen. No enlarged lymph nodes. There is an oval mass in the medial aspect of the superior segment of the left lower lobe, encasing the left lower lobe pulmonary arteries and veins. There is diffuse narrowing of the left lower lobe pulmonary vein. On image number 76, this mass measures 5.7 x  3.3 cm. This measures 8.7 cm in length on sagittal image number 88. On coronal image number 68, this is seen extending into the inferior, medial aspect of the left upper lobe, posteriorly. This extends into the left hilum and is partially encasing the proximal left lower lobe bronchus, with mild to moderate diffuse narrowing of the bronchus. There is adjacent patchy airspace consolidation in the left lower lobe. The right lung is clear. A 4 mm nodule was demonstrated in the posterior aspect of the left upper lobe  on image number 66. There are also several small areas of nodularity in the posterior aspect of the left lower lobe on image number 121. The largest of these is linear in configuration and measures 10 mm in maximum diameter on that image. There are 3 small adjacent nodules in the left lower lobe on image number 104, the largest measuring 4 mm. No pleural fluid.  Normal sized heart.  Small hiatal hernia. Minimal thoracic spine degenerative changes. No evidence of bony metastatic disease. CT ABDOMEN and PELVIS FINDINGS Diffuse low density of the liver. Normal appearing gallbladder. Unremarkable spleen, pancreas, adrenal glands, left kidney, ureters and urinary bladder. Small right renal cysts. Inferior vena cava filter. No deep venous thrombosis visualized. No gastrointestinal abnormalities or enlarged lymph nodes. Normal appearing appendix. Unremarkable uterus and ovaries. Disc space narrowing and discogenic sclerosis at the L1-2 level. Hemangiomas in the L3 and L4 vertebral bodies. No evidence of bony metastatic disease. Review of the MIP images confirms the above findings. IMPRESSION: 1. 8.7 x 5.7 x 3.3 cm mass in the medial aspect of the superior segment of the left lower lobe, it extending into the medial aspect of the left upper lobe and into the left hilum, with vascular and bronchial bronchial encasement and narrowing. 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 2. Multiple small left lung nodules, potentially metastases. 3. Diffuse hepatic steatosis. 4. Small sliding hiatal hernia. 5. No evidence of malignancy or metastatic disease in the abdomen or pelvis. These results were called by telephone at the time of interpretation on 03/05/2016 at 1:51 pm to Dr. Delman Kitten , who verbally acknowledged these results. Electronically Signed   By: Claudie Revering M.D.   On: 03/05/2016 13:51   Ct Abdomen Pelvis W Contrast  03/05/2016  CLINICAL DATA:  Chronic cough and chronic abdomen and  pelvic pain. The pain is in the upper abdomen, increasing over the past several months. Left leg swelling. Clinical concern for deep venous thrombosis. EXAM: CT ANGIOGRAPHY CHEST CT ABDOMEN AND PELVIS WITH CONTRAST TECHNIQUE: Multidetector CT imaging of the chest was performed using the standard protocol during bolus administration of intravenous contrast. Multiplanar CT image reconstructions and MIPs were obtained to evaluate the vascular anatomy. Multidetector CT imaging of the abdomen and pelvis was performed using the standard protocol during bolus administration of intravenous contrast. CONTRAST:  100 cc Isovue 370 COMPARISON:  Abdomen and pelvis CT dated 11/24/2012. Bilateral lower extremity venous Doppler examination obtained earlier today. FINDINGS: CTA CHEST FINDINGS Normally opacified pulmonary arteries with no pulmonary arterial filling defects seen. No enlarged lymph nodes. There is an oval mass in the medial aspect of the superior segment of the left lower lobe, encasing the left lower lobe pulmonary arteries and veins. There is diffuse narrowing of the left lower lobe pulmonary vein. On image number 76, this mass measures 5.7 x 3.3 cm. This measures 8.7 cm in length on sagittal image number 88. On coronal image number 68,  this is seen extending into the inferior, medial aspect of the left upper lobe, posteriorly. This extends into the left hilum and is partially encasing the proximal left lower lobe bronchus, with mild to moderate diffuse narrowing of the bronchus. There is adjacent patchy airspace consolidation in the left lower lobe. The right lung is clear. A 4 mm nodule was demonstrated in the posterior aspect of the left upper lobe on image number 66. There are also several small areas of nodularity in the posterior aspect of the left lower lobe on image number 121. The largest of these is linear in configuration and measures 10 mm in maximum diameter on that image. There are 3 small adjacent  nodules in the left lower lobe on image number 104, the largest measuring 4 mm. No pleural fluid.  Normal sized heart.  Small hiatal hernia. Minimal thoracic spine degenerative changes. No evidence of bony metastatic disease. CT ABDOMEN and PELVIS FINDINGS Diffuse low density of the liver. Normal appearing gallbladder. Unremarkable spleen, pancreas, adrenal glands, left kidney, ureters and urinary bladder. Small right renal cysts. Inferior vena cava filter. No deep venous thrombosis visualized. No gastrointestinal abnormalities or enlarged lymph nodes. Normal appearing appendix. Unremarkable uterus and ovaries. Disc space narrowing and discogenic sclerosis at the L1-2 level. Hemangiomas in the L3 and L4 vertebral bodies. No evidence of bony metastatic disease. Review of the MIP images confirms the above findings. IMPRESSION: 1. 8.7 x 5.7 x 3.3 cm mass in the medial aspect of the superior segment of the left lower lobe, it extending into the medial aspect of the left upper lobe and into the left hilum, with vascular and bronchial bronchial encasement and narrowing. 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 2. Multiple small left lung nodules, potentially metastases. 3. Diffuse hepatic steatosis. 4. Small sliding hiatal hernia. 5. No evidence of malignancy or metastatic disease in the abdomen or pelvis. These results were called by telephone at the time of interpretation on 03/05/2016 at 1:51 pm to Dr. Delman Kitten , who verbally acknowledged these results. Electronically Signed   By: Claudie Revering M.D.   On: 03/05/2016 13:51   US Venous Img Lower Bilateral  03/05/2016  CLINICAL DATA:  Swelling LEFT leg for 3 years, question DVT, history of prior deep venous thrombosis, EXAM: BILATERAL LOWER EXTREMITY VENOUS DOPPLER ULTRASOUND TECHNIQUE: Gray-scale sonography with graded compression, as well as color Doppler and duplex ultrasound were performed to evaluate the lower extremity deep venous  systems from the level of the common femoral vein and including the common femoral, femoral, profunda femoral, popliteal and calf veins including the posterior tibial, peroneal and gastrocnemius veins when visible. The superficial great saphenous vein was also interrogated. Spectral Doppler was utilized to evaluate flow at rest and with distal augmentation maneuvers in the common femoral, femoral and popliteal veins. COMPARISON:  None. FINDINGS: RIGHT LOWER EXTREMITY Common Femoral Vein: No evidence of thrombus. Normal compressibility, respiratory phasicity and response to augmentation. Saphenofemoral Junction: No evidence of thrombus. Normal compressibility and flow on color Doppler imaging. Profunda Femoral Vein: No evidence of thrombus. Normal compressibility and flow on color Doppler imaging. Femoral Vein: No evidence of thrombus. Normal compressibility, respiratory phasicity and response to augmentation. Popliteal Vein: No evidence of thrombus. Normal compressibility, respiratory phasicity and response to augmentation. Calf Veins: No evidence of thrombus. Normal compressibility and flow on color Doppler imaging. Superficial Great Saphenous Vein: No evidence of thrombus. Normal compressibility and flow on color Doppler imaging. Venous Reflux:  None. Other Findings:  None. LEFT LOWER EXTREMITY Common Femoral Vein: No evidence of thrombus. Normal compressibility, respiratory phasicity and response to augmentation. Saphenofemoral Junction: No evidence of thrombus. Normal compressibility and flow on color Doppler imaging. Profunda Femoral Vein: No evidence of thrombus. Normal compressibility and flow on color Doppler imaging. Femoral Vein: Small size which may represent sequella of prior deep venous thrombosis. No evidence of thrombus. Normal compressibility, respiratory phasicity and response to augmentation. Popliteal Vein: No evidence of thrombus. Normal compressibility, respiratory phasicity and response to  augmentation. Calf Veins: No evidence of thrombus. Normal compressibility and flow on color Doppler imaging. Superficial Great Saphenous Vein: No evidence of thrombus. Normal compressibility and flow on color Doppler imaging. Venous Reflux:  None. Other Findings:  None. IMPRESSION: No evidence of deep venous thrombosis in LEFT lower extremity. Electronically Signed   By: Lavonia Dana M.D.   On: 03/05/2016 13:11    EKG:   Orders placed or performed during the hospital encounter of 03/05/16  . EKG 12-Lead  . EKG 12-Lead    IMPRESSION AND PLAN:   Faren Florence  is a 55 y.o. female with a known history of unprovoked DVT in 2014, treated with Xarelto and currently off of anticoagulation presents to the hospital secondary to generalized weakness, dry cough and fatigue going on for almost 2 weeks now.  #1 left lower lobe pneumonia-post obstructive pneumonia. -Early sepsis on admission. Tachycardic and also elevated white count. IV fluids and started on IV antibiotics. -Follow blood cultures  #2 left lung mass-left lower lobe mass encasing the lower bronchus. Pulmonary consulted. Likely will need bronchoscopy and biopsy. -Oncology consulted as well. Likely risk factor from her smoking and has family history of cancers  #3 hypokalemia-being replaced  #4 hypertension-started on low-dose metoprolol  #5 DVT prophylaxis-on Lovenox. Prior history of DVT more than 3 years ago in her left leg and had thrombectomy done and finish Xarelto., repeat Dopplers did not show any DVT this time. Patient is status post IVC filter  #6 tobacco use disorder-started on nicotine patch    All the records are reviewed and case discussed with ED provider. Management plans discussed with the patient, family and they are in agreement.  CODE STATUS: Full Code  TOTAL TIME TAKING CARE OF THIS PATIENT: 50 minutes.    Gladstone Lighter M.D on 03/05/2016 at 3:00 PM  Between 7am to 6pm - Pager - [phone]  After 6pm  go to [email or web address] - password [email or web address] [facility]  [facility]  Office  [phone]  CC: Primary care physician; No PCP Per Patient

## 2016-03-05 NOTE — Consult Note (Signed)
[facility] Consultation      Assessment and Plan:  Left lung mass, suspicious for cancer. -I've discussed the possible diagnosis in depth with the patient, and we have come up with a plan was for include diagnosis, staging, treatment over the next few weeks.  -The patient would like to pursue all further testing if she is discharged from the hospital, therefore I'll arrange for our office to call her to arrange a CT-guided lung biopsy to be done next week, she can follow up with our office and/or with the [facility] afterwards.  -Patient was given my card and asked to call us back if she does not hear back from [place] in the next week, otherwise we will be contacting her to set up an outpatient CT-guided lung biopsy.  Acute respiratory failure with acute bronchitis. -I do not see any evidence of pneumonia, though this could be obscured by the presence of mass. -Continue treatment with antibiotics, this can be continued outpatient.  Nicotine abuse/smoking. -. Discussed smoking cessation, and this can interfere with treatment for cancer, therefore, recommend smoking cessation at this time. Patient is in agreement think she is ready to stop.  History of DVT. -Continue DVT prophylaxis. -Given her history of DVT in the left lower extremity, as well as IVC filter placement, will check a lower extremity Doppler. If positive, the patient will likely need to be continued on anticoagulation indefinitely. Given her history of previous clot, and her current possible cancer. She would be at higher risk of venous thromboembolic disease.  The patient can otherwise be discharged home from respiratory standpoint once her other conditions have stabilized. We will be seeing her again on Monday if she is still in the hospital, otherwise we will be seeing her outpatient. If there are further questions or concerns, please page [phone].  Date: 03/05/2016  MRN# [id number] Christina Mckee  Jul 11, 1961  Referring Physician: Dr. Anibal Henderson is a 55 y.o. old female seen in consultation for chief complaint of:    Chief Complaint  Patient presents with  . Cough  . Sore Throat    HPI:   Patient is a 55 year old female. The patient tells me that she has been having some cough and bloated feeling, for "a while". She is unable to quantify exactly how long she's been having these, however, over the last 3 weeks. She notes that the cough is been much more problematic. She does have the cough is much severe, occurs more often during the day, and the bloating feeling. Has become worse. She took a course of azithromycin with no real relief, she tried some over-the-counter medications for her bloating, again she had no relief. She denies any chest pain. Subsequent she presented to the hospital because of lack of improvement. She underwent a CT of the chest which showed a left lung mass. On my review of the CT of the chest images and reports, there appears to be a 8.7 cm mass in the left lower lobe medial segment. There is minimal to no lymphadenopathy seen in the hilar area. There is perhaps some area of lymphadenopathy in the precarinal area. The CT of the chest is otherwise remarkable other than the presence of an IVC filter. The patient had an unprovoked left lower extremity blood clot. Prostate 3 years ago, at that time she was treated with placement of an IVC filter, and was on anticoagulation for 3 months. The patient tells me that she was scheduled to  have the IVC filter removed, but she came very anxious about having it removed because the placement of it caused her pain. Therefore she opted not to have the filter removed. She does not know any problems with lower extremity chronic edema.   PMHX:   Past Medical History  Diagnosis Date  . DVT (deep venous thrombosis) ([place])     in 2015   Surgical Hx:  Past Surgical History  Procedure Laterality Date  . Cesarean section     . Ivc filter placement ([facility] hx)    . Breast cyst excision      left breast removal   Family Hx:  Family History  Problem Relation Age of Onset  . Breast cancer Mother   . Breast cancer Maternal Aunt   . Stomach cancer Paternal Uncle   . Leukemia Paternal Uncle    Social Hx:   Social History  Substance Use Topics  . Smoking status: Current Every Day Smoker -- 0.50 packs/day    Types: Cigarettes  . Smokeless tobacco: Never Used     Comment: 1/2 PPD to 1PPD  . Alcohol Use: 0.0 oz/week    0 Standard drinks or equivalent per week     Comment: daily about 4 beers/day   Medication:   No current outpatient prescriptions on file.    Allergies:  Penicillins  Review of Systems: Gen:  Denies  fever, sweats, chills HEENT: Denies blurred vision, double vision. bleeds,  Cvc:  No dizziness, chest pain. Resp:   Deniessputum production, shortness of breath Gi: Denies swallowing difficulty, stomach pain. Gu:  Denies bladder incontinence, burning urine Ext:   No Joint pain, stiffness. Skin: No skin rash,  hives  Endoc:  No polyuria, polydipsia. Psych: No depression, insomnia. Other:  All other systems were reviewed with the patient and were negative other that what is mentioned in the HPI.   Physical Examination:   VS: BP 137/94 mmHg  Pulse 115  Temp(Src) 98.6 F (37 C) (Oral)  Resp 21  Ht '5\' 5"'$  (1.651 m)  Wt 120 lb (54.432 kg)  BMI 19.97 kg/m2  SpO2 98%  General Appearance: No distress  Neuro:without focal findings,  speech normal,  HEENT: PERRLA, EOM intact.   Pulmonary: normal breath sounds, No wheezing. Left-sided crackles.  CardiovascularNormal S1,S2.  No m/r/g.   Abdomen: Benign, Soft, non-tender. Renal:  No costovertebral tenderness  GU:  No performed at this time. Endoc: No evident thyromegaly, no signs of acromegaly. Skin:   warm, no rashes, no ecchymosis  Extremities: normal, no cyanosis, clubbing.  Other findings:    LABORATORY PANEL:   CBC  Recent  Labs Lab 03/05/16 1137  WBC 26.8*  HGB 12.4  HCT 37.1  PLT 364   ------------------------------------------------------------------------------------------------------------------  Chemistries   Recent Labs Lab 03/05/16 1137  NA 134*  K 3.4*  CL 100*  CO2 19*  GLUCOSE 155*  BUN 10  CREATININE 0.65  CALCIUM 9.3  AST 28  ALT 19  ALKPHOS 74  BILITOT 0.8   ------------------------------------------------------------------------------------------------------------------  Cardiac Enzymes  Recent Labs Lab 03/05/16 1137  TROPONINI <0.03   ------------------------------------------------------------  RADIOLOGY:  Ct Angio Chest Pe W/cm &/or Wo Cm  03/05/2016  CLINICAL DATA:  Chronic cough and chronic abdomen and pelvic pain. The pain is in the upper abdomen, increasing over the past several months. Left leg swelling. Clinical concern for deep venous thrombosis. EXAM: CT ANGIOGRAPHY CHEST CT ABDOMEN AND PELVIS WITH CONTRAST TECHNIQUE: Multidetector CT imaging of the chest was performed using  the standard protocol during bolus administration of intravenous contrast. Multiplanar CT image reconstructions and MIPs were obtained to evaluate the vascular anatomy. Multidetector CT imaging of the abdomen and pelvis was performed using the standard protocol during bolus administration of intravenous contrast. CONTRAST:  100 cc Isovue 370 COMPARISON:  Abdomen and pelvis CT dated 11/24/2012. Bilateral lower extremity venous Doppler examination obtained earlier today. FINDINGS: CTA CHEST FINDINGS Normally opacified pulmonary arteries with no pulmonary arterial filling defects seen. No enlarged lymph nodes. There is an oval mass in the medial aspect of the superior segment of the left lower lobe, encasing the left lower lobe pulmonary arteries and veins. There is diffuse narrowing of the left lower lobe pulmonary vein. On image number 76, this mass measures 5.7 x 3.3 cm. This measures 8.7 cm in  length on sagittal image number 88. On coronal image number 68, this is seen extending into the inferior, medial aspect of the left upper lobe, posteriorly. This extends into the left hilum and is partially encasing the proximal left lower lobe bronchus, with mild to moderate diffuse narrowing of the bronchus. There is adjacent patchy airspace consolidation in the left lower lobe. The right lung is clear. A 4 mm nodule was demonstrated in the posterior aspect of the left upper lobe on image number 66. There are also several small areas of nodularity in the posterior aspect of the left lower lobe on image number 121. The largest of these is linear in configuration and measures 10 mm in maximum diameter on that image. There are 3 small adjacent nodules in the left lower lobe on image number 104, the largest measuring 4 mm. No pleural fluid.  Normal sized heart.  Small hiatal hernia. Minimal thoracic spine degenerative changes. No evidence of bony metastatic disease. CT ABDOMEN and PELVIS FINDINGS Diffuse low density of the liver. Normal appearing gallbladder. Unremarkable spleen, pancreas, adrenal glands, left kidney, ureters and urinary bladder. Small right renal cysts. Inferior vena cava filter. No deep venous thrombosis visualized. No gastrointestinal abnormalities or enlarged lymph nodes. Normal appearing appendix. Unremarkable uterus and ovaries. Disc space narrowing and discogenic sclerosis at the L1-2 level. Hemangiomas in the L3 and L4 vertebral bodies. No evidence of bony metastatic disease. Review of the MIP images confirms the above findings. IMPRESSION: 1. 8.7 x 5.7 x 3.3 cm mass in the medial aspect of the superior segment of the left lower lobe, it extending into the medial aspect of the left upper lobe and into the left hilum, with vascular and bronchial bronchial encasement and narrowing. 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 2. Multiple small left lung nodules,  potentially metastases. 3. Diffuse hepatic steatosis. 4. Small sliding hiatal hernia. 5. No evidence of malignancy or metastatic disease in the abdomen or pelvis. These results were called by telephone at the time of interpretation on 03/05/2016 at 1:51 pm to Dr. Delman Kitten , who verbally acknowledged these results. Electronically Signed   By: Claudie Revering M.D.   On: 03/05/2016 13:51   Ct Abdomen Pelvis W Contrast  03/05/2016  CLINICAL DATA:  Chronic cough and chronic abdomen and pelvic pain. The pain is in the upper abdomen, increasing over the past several months. Left leg swelling. Clinical concern for deep venous thrombosis. EXAM: CT ANGIOGRAPHY CHEST CT ABDOMEN AND PELVIS WITH CONTRAST TECHNIQUE: Multidetector CT imaging of the chest was performed using the standard protocol during bolus administration of intravenous contrast. Multiplanar CT image reconstructions and MIPs were obtained to  evaluate the vascular anatomy. Multidetector CT imaging of the abdomen and pelvis was performed using the standard protocol during bolus administration of intravenous contrast. CONTRAST:  100 cc Isovue 370 COMPARISON:  Abdomen and pelvis CT dated 11/24/2012. Bilateral lower extremity venous Doppler examination obtained earlier today. FINDINGS: CTA CHEST FINDINGS Normally opacified pulmonary arteries with no pulmonary arterial filling defects seen. No enlarged lymph nodes. There is an oval mass in the medial aspect of the superior segment of the left lower lobe, encasing the left lower lobe pulmonary arteries and veins. There is diffuse narrowing of the left lower lobe pulmonary vein. On image number 76, this mass measures 5.7 x 3.3 cm. This measures 8.7 cm in length on sagittal image number 88. On coronal image number 68, this is seen extending into the inferior, medial aspect of the left upper lobe, posteriorly. This extends into the left hilum and is partially encasing the proximal left lower lobe bronchus, with mild to  moderate diffuse narrowing of the bronchus. There is adjacent patchy airspace consolidation in the left lower lobe. The right lung is clear. A 4 mm nodule was demonstrated in the posterior aspect of the left upper lobe on image number 66. There are also several small areas of nodularity in the posterior aspect of the left lower lobe on image number 121. The largest of these is linear in configuration and measures 10 mm in maximum diameter on that image. There are 3 small adjacent nodules in the left lower lobe on image number 104, the largest measuring 4 mm. No pleural fluid.  Normal sized heart.  Small hiatal hernia. Minimal thoracic spine degenerative changes. No evidence of bony metastatic disease. CT ABDOMEN and PELVIS FINDINGS Diffuse low density of the liver. Normal appearing gallbladder. Unremarkable spleen, pancreas, adrenal glands, left kidney, ureters and urinary bladder. Small right renal cysts. Inferior vena cava filter. No deep venous thrombosis visualized. No gastrointestinal abnormalities or enlarged lymph nodes. Normal appearing appendix. Unremarkable uterus and ovaries. Disc space narrowing and discogenic sclerosis at the L1-2 level. Hemangiomas in the L3 and L4 vertebral bodies. No evidence of bony metastatic disease. Review of the MIP images confirms the above findings. IMPRESSION: 1. 8.7 x 5.7 x 3.3 cm mass in the medial aspect of the superior segment of the left lower lobe, it extending into the medial aspect of the left upper lobe and into the left hilum, with vascular and bronchial bronchial encasement and narrowing. 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 2. Multiple small left lung nodules, potentially metastases. 3. Diffuse hepatic steatosis. 4. Small sliding hiatal hernia. 5. No evidence of malignancy or metastatic disease in the abdomen or pelvis. These results were called by telephone at the time of interpretation on 03/05/2016 at 1:51 pm to Dr. Delman Kitten  , who verbally acknowledged these results. Electronically Signed   By: Claudie Revering M.D.   On: 03/05/2016 13:51   US Venous Img Lower Bilateral  03/05/2016  CLINICAL DATA:  Swelling LEFT leg for 3 years, question DVT, history of prior deep venous thrombosis, EXAM: BILATERAL LOWER EXTREMITY VENOUS DOPPLER ULTRASOUND TECHNIQUE: Gray-scale sonography with graded compression, as well as color Doppler and duplex ultrasound were performed to evaluate the lower extremity deep venous systems from the level of the common femoral vein and including the common femoral, femoral, profunda femoral, popliteal and calf veins including the posterior tibial, peroneal and gastrocnemius veins when visible. The superficial great saphenous vein was also interrogated. Spectral Doppler was  utilized to evaluate flow at rest and with distal augmentation maneuvers in the common femoral, femoral and popliteal veins. COMPARISON:  None. FINDINGS: RIGHT LOWER EXTREMITY Common Femoral Vein: No evidence of thrombus. Normal compressibility, respiratory phasicity and response to augmentation. Saphenofemoral Junction: No evidence of thrombus. Normal compressibility and flow on color Doppler imaging. Profunda Femoral Vein: No evidence of thrombus. Normal compressibility and flow on color Doppler imaging. Femoral Vein: No evidence of thrombus. Normal compressibility, respiratory phasicity and response to augmentation. Popliteal Vein: No evidence of thrombus. Normal compressibility, respiratory phasicity and response to augmentation. Calf Veins: No evidence of thrombus. Normal compressibility and flow on color Doppler imaging. Superficial Great Saphenous Vein: No evidence of thrombus. Normal compressibility and flow on color Doppler imaging. Venous Reflux:  None. Other Findings:  None. LEFT LOWER EXTREMITY Common Femoral Vein: No evidence of thrombus. Normal compressibility, respiratory phasicity and response to augmentation. Saphenofemoral Junction:  No evidence of thrombus. Normal compressibility and flow on color Doppler imaging. Profunda Femoral Vein: No evidence of thrombus. Normal compressibility and flow on color Doppler imaging. Femoral Vein: Small size which may represent sequella of prior deep venous thrombosis. No evidence of thrombus. Normal compressibility, respiratory phasicity and response to augmentation. Popliteal Vein: No evidence of thrombus. Normal compressibility, respiratory phasicity and response to augmentation. Calf Veins: No evidence of thrombus. Normal compressibility and flow on color Doppler imaging. Superficial Great Saphenous Vein: No evidence of thrombus. Normal compressibility and flow on color Doppler imaging. Venous Reflux:  None. Other Findings:  None. IMPRESSION: No evidence of deep venous thrombosis in LEFT lower extremity. Electronically Signed   By: Lavonia Dana M.D.   On: 03/05/2016 13:11       Thank  you for the consultation and for allowing [facility] to assist in the care of your patient. Our recommendations are noted above.  Please contact us if we can be of further service.   Marda Stalker, MD.  Board Certified in Internal Medicine, Pulmonary Medicine, [place], and Sleep Medicine.  [facility] Office Number: [phone]  Patricia Pesa, M.D.  Vilinda Boehringer, M.D.  Merton Border, M.D  03/05/2016

## 2016-03-05 NOTE — ED Provider Notes (Signed)
[facility] Emergency Department Provider Note  ____________________________________________  Time seen: Approximately 11:46 AM  I have reviewed the triage vital signs and the nursing notes.   HISTORY  Chief Complaint Cough and Sore Throat    HPI Christina Mckee is a 55 y.o. female person to have a cough for months, a circumflex have congestion in throat. She did feeling somewhat fatigued. He reports that she feels "sick of coughing. She also reports a sort of full feeling in her upper abdomen and left chest.  Denies chest pain. No shortness of breath. Does report a history of prior DVT, IVC filter. She had a blood clot about a year ago. She is an active smoker.  No headache. No numbness weakness tingling nausea or vomiting.   Past Medical History  Diagnosis Date  . DVT (deep venous thrombosis) ([place])     in 2015    There are no active problems to display for this patient.   No past surgical history on file.  No current outpatient prescriptions on file.  Allergies Penicillins  No family history on file.  Social History Social History  Substance Use Topics  . Smoking status: Current Every Day Smoker -- 0.50 packs/day    Types: Cigarettes  . Smokeless tobacco: Never Used  . Alcohol Use: Yes     Comment: daily    Review of Systems Constitutional: No fever/chills Eyes: No visual changes. ENT: No sore throat.Feels "scratchy" Cardiovascular: Denies chest pain. Respiratory: See history of present illness Gastrointestinal: No abdominal pain.  No nausea, no vomiting.  No diarrhea.  No constipation. Genitourinary: Negative for dysuria. Musculoskeletal: Negative for back pain. Skin: Negative for rash. Neurological: Negative for headaches, focal weakness or numbness.  10-point ROS otherwise negative.  ____________________________________________   PHYSICAL EXAM:  VITAL SIGNS: ED Triage Vitals  Enc Vitals Group     BP 03/05/16 1115  149/76 mmHg     Pulse Rate 03/05/16 1115 145     Resp 03/05/16 1115 16     Temp 03/05/16 1115 98.6 F (37 C)     Temp Source 03/05/16 1115 Oral     SpO2 03/05/16 1115 94 %     Weight 03/05/16 1115 120 lb (54.432 kg)     Height 03/05/16 1115 '5\' 5"'$  (1.651 m)     Head Cir --      Peak Flow --      Pain Score 03/05/16 1117 0     Pain Loc --      Pain Edu? --      Excl. in [place]? --    Constitutional: Alert and oriented. Somewhat underweight, slightly pale appearance. Eyes: Conjunctivae are normal. PERRL. EOMI. Head: Atraumatic. Nose: No congestion/rhinnorhea. Mouth/Throat: Mucous membranes are dry.  Oropharynx non-erythematous. Neck: No stridor.   Cardiovascular: Tachycardic rate, regular rhythm. Grossly normal heart sounds.  Good peripheral circulation. Respiratory: Normal respiratory effort.  No retractions. Lungs CTAB. Gastrointestinal: Soft and nontender. No distention. No abdominal bruits. No CVA tenderness. Musculoskeletal: No lower extremity tenderness nor edema though the left lower extremity does seem slightly larger than the right, patient reports this is chronic since her previous blood clot.  No joint effusions. Neurologic:  Normal speech and language. No gross focal neurologic deficits are appreciated. Skin:  Skin is warm, dry and intact. No rash noted. Psychiatric: Mood and affect are normal. Speech and behavior are normal.  ____________________________________________   LABS (all labs ordered are listed, but only abnormal results are displayed)  Labs  Reviewed  CBC WITH DIFFERENTIAL/PLATELET - Abnormal; Notable for the following:    WBC 26.8 (*)    Neutro Abs 24.0 (*)    Lymphs Abs 0.9 (*)    Monocytes Absolute 1.8 (*)    Basophils Absolute 0.2 (*)    All other components within normal limits  COMPREHENSIVE METABOLIC PANEL - Abnormal; Notable for the following:    Sodium 134 (*)    Potassium 3.4 (*)    Chloride 100 (*)    CO2 19 (*)    Glucose, Bld 155 (*)     All other components within normal limits  CULTURE, BLOOD (ROUTINE X 2)  CULTURE, BLOOD (ROUTINE X 2)  LIPASE, BLOOD  BRAIN NATRIURETIC PEPTIDE  TROPONIN I  TSH  LACTIC ACID, PLASMA  URINALYSIS COMPLETEWITH MICROSCOPIC ([facility] ONLY)  LACTIC ACID, PLASMA   ____________________________________________  EKG  Reviewed and interpreted by me at 11:25 AM Ventricular rate 140 PR 1:30 QRS 70 QTc 435 Reviewed and interpreted as sinus tachycardia without ischemic abnormality noted. ____________________________________________  RADIOLOGY  IMPRESSION: 1. 8.7 x 5.7 x 3.3 cm mass in the medial aspect of the superior segment of the left lower lobe, it extending into the medial aspect of the left upper lobe and into the left hilum, with vascular and bronchial bronchial encasement and narrowing. 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 2. Multiple small left lung nodules, potentially metastases. 3. Diffuse hepatic steatosis. 4. Small sliding hiatal hernia. 5. No evidence of malignancy or metastatic disease in the abdomen or pelvis. These results were called by telephone at the time of interpretation on 03/05/2016 at 1:51 pm to Dr. Delman Kitten , who verbally acknowledged these results.  ____________________________________________   PROCEDURES  Procedure(s) performed: None  Critical Care performed: No  ____________________________________________   INITIAL IMPRESSION / ASSESSMENT AND PLAN / ED COURSE  Pertinent labs & imaging results that were available during my care of the patient were reviewed by me and considered in my medical decision making (see chart for details).  CT reveals mass the left side. Discussed with radiology as well as also Dr. Mike Gip of oncology who advises admission, pulmonary consultation, and that the left be involved to further identify the cause. I discussed with the radiologist and they note that there is no clear  underlying infiltrate or evidence of infection but feels primarily this is some slight of new cancer, likely primary lung.  The patient does demonstrate surge criteria but no evidence of obvious infection is found. She is afebrile, does have elevated white count however in the setting of this mass could certainly be related. I will hold on antibiotics here, we'll draw blood cultures and have the hospitalist service see her and admit for persistent tachycardia in the setting of a new not fully diagnosed lung mass.  Patient agreeable with plan. ____________________________________________   FINAL CLINICAL IMPRESSION(S) / ED DIAGNOSES  Final diagnoses:  Tachycardia  SIRS (systemic inflammatory response syndrome) (HCC)  Lung mass      Delman Kitten, MD 03/05/16 1449

## 2016-03-05 NOTE — ED Notes (Signed)
Patient brought to room 2 by triage nurse. Patient placed on monitor by this RN

## 2016-03-06 LAB — BASIC METABOLIC PANEL
Anion gap: 7 (ref 5–15)
CO2: 23 mmol/L (ref 22–32)
CREATININE: 0.38 mg/dL — AB (ref 0.44–1.00)
Calcium: 8 mg/dL — ABNORMAL LOW (ref 8.9–10.3)
Chloride: 103 mmol/L (ref 101–111)
GFR calc Af Amer: >60 mL/min (ref >60)
GFR calc non Af Amer: >60 mL/min (ref >60)
GLUCOSE: 113 mg/dL — AB (ref 65–99)
POTASSIUM: 3.3 mmol/L — AB (ref 3.5–5.1)
SODIUM: 133 mmol/L — AB (ref 135–145)

## 2016-03-06 LAB — CBC
HEMATOCRIT: 32.2 % — AB (ref 35.0–47.0)
HEMOGLOBIN: 10.8 g/dL — AB (ref 12.0–16.0)
MCH: 31.9 pg (ref 26.0–34.0)
MCHC: 33.6 g/dL (ref 32.0–36.0)
MCV: 95.1 fL (ref 80.0–100.0)
Platelets: 297 10*3/uL (ref 150–440)
RBC: 3.39 MIL/uL — ABNORMAL LOW (ref 3.80–5.20)
RDW: 13.3 % (ref 11.5–14.5)
WBC: 13.6 10*3/uL — AB (ref 3.6–11.0)

## 2016-03-06 MED ORDER — LEVOFLOXACIN 750 MG PO TABS: 750.0000 mg | ORAL_TABLET | Freq: Every day | ORAL | Status: DC

## 2016-03-06 MED ORDER — MOMETASONE FURO-FORMOTEROL FUM 200-5 MCG/ACT IN AERO
2.0000 | INHALATION_SPRAY | Freq: Two times a day (BID) | RESPIRATORY_TRACT | Status: DC
Start: 2016-03-06 — End: 2016-03-09

## 2016-03-06 MED ORDER — NICOTINE 21 MG/24HR TD PT24: 21.0000 mg | MEDICATED_PATCH | Freq: Every day | TRANSDERMAL | Status: DC

## 2016-03-06 MED ORDER — METOPROLOL TARTRATE 25 MG PO TABS: 25.0000 mg | ORAL_TABLET | Freq: Two times a day (BID) | ORAL | Status: DC

## 2016-03-06 MED ORDER — GUAIFENESIN-CODEINE 100-10 MG/5ML PO SOLN: 10.0000 mL | Freq: Four times a day (QID) | ORAL | Status: DC | PRN

## 2016-03-06 NOTE — Discharge Instructions (Signed)
Stop smoking

## 2016-03-06 NOTE — Consult Note (Signed)
[facility]  Date of admission:  03/05/2016  Inpatient day:  03/05/2016  Consulting physician:  Dr. Gladstone Lighter   Reason for Consultation:  Lung mass.  Chief Complaint: Christina Mckee is a 55 y.o. female who was admitted with a left lower lobe mass and obstructive pneumonia.  HPI: The patient notes a 30-40 pack year smoking history. Prior to recent events, she denies any respiratory issues.  She notes a 3 month history of cough and pressure-like sensation in her abdomen. She was prescribed a Z-Pak approximately 3 weeks ago.  Her symptom temporarily improved.  She has had a residual dry scratchy cough.  Because of progressive symptoms, she presented to the emergency room.   Because of a history of a DVT in 2014, she underwent chest CT angiogram. Chest CT revealed no evidence of thrombosis. There was an 8.7 x 5.7 x 3.3 cm mass in the medial aspect of the superior segment of the left lower lobe extending into the medial aspect of the left upper lobe and into the left hilum.  There was vascular and endobronchial encasement and narrowing. There were multiple small left lung nodules. Abdomen and pelvic CT scan revealed diffuse hepatic steatosis and no evidence of metastatic disease in the abdomen or pelvis.  In addition, she underwent bilateral lower extremity duplex. There was no evidence of thrombosis.  She was started on the Levaquin. She is on Lovenox for DVT prophylaxis.  She is anxious to leave the hospital and complete her work-up.  Symptomatically, she denies any fevers, sweats, or weight loss.  She denies any bone pain or neurologic symptoms.  Patient's history is notable for an extensive left lower extremity thromboses in 12/2012.  Because of the extensive clot burden, she underwent TPA with angioplasty on 12/28/2012.  An IVC filter was placed on 12/29/2012 and still remains.  Hypercoagulable workup was negative for factor V Leiden, prothrombin gene mutation,  lupus anticoagulant and antithrombin III. She was on Xarelto for 6 months.   Past Medical History  Diagnosis Date  . DVT (deep venous thrombosis) ([place])     in 2015    Past Surgical History  Procedure Laterality Date  . Cesarean section    . Ivc filter placement ([facility] hx)    . Breast cyst excision      left breast removal    Family History  Problem Relation Age of Onset  . Breast cancer Mother   . Breast cancer Maternal Aunt   . Stomach cancer Paternal Uncle   . Leukemia Paternal Uncle     Social History:  reports that she has been smoking Cigarettes.  She has been smoking about 0.50 packs per day. She has never used smokeless tobacco. She reports that she drinks alcohol. She reports that she does not use illicit drugs. She has smoked 1/2-1 pack a day since age 43.  She drinks 4 beers a day.  She lives in [place] alone with her cats and dogs.  She works at [employer].  She is alone today.  Allergies:  Allergies  Allergen Reactions  . Penicillins Hives and Other (See Comments)    Has patient had a PCN reaction causing immediate rash, facial/tongue/throat swelling, SOB or lightheadedness with hypotension: No Has patient had a PCN reaction causing severe rash involving mucus membranes or skin necrosis: No Has patient had a PCN reaction that required hospitalization No Has patient had a PCN reaction occurring within the last 10 years: No If all of the above  answers are "NO", then may proceed with Cephalosporin use.    No prescriptions prior to admission    Review of Systems: GENERAL:  Fatigue.  No fevers, sweats or weight loss. PERFORMANCE STATUS (ECOG):  1 HEENT:  No visual changes, runny nose, sore throat, mouth sores or tenderness. Lungs: No shortness of breath.  Dr hacking cough.  No hemoptysis. Cardiac:  No chest pain, palpitations, orthopnea, or PND. GI:  Pressure sensation in upper abdomen.  No nausea, vomiting, diarrhea, constipation, melena or hematochezia. GU:  No  urgency, frequency, dysuria, or hematuria. Musculoskeletal:  No back pain.  No joint pain.  No muscle tenderness. Extremities:  Chronic left lower extremity edema.  No pain. Skin:  No rashes or skin changes. Neuro:  No headache, numbness or weakness, balance or coordination issues. Endocrine:  No diabetes, thyroid issues, hot flashes or night sweats. Psych:  No mood changes, depression or anxiety. Pain:  No focal pain. Review of systems:  All other systems reviewed and found to be negative.  Physical Exam:  Blood pressure 110/63, pulse 104, temperature 98.7 F (37.1 C), temperature source Oral, resp. rate 16, height '5\' 5"'  (1.651 m), weight 120 lb (54.432 kg), SpO2 96 %.  GENERAL: Thin woman sitting comfortably on the medical unit in no acute distress. MENTAL STATUS:  Alert and oriented to person, place and time. HEAD:  Glasses propped on head.  [place] shoulder length hair.  Normocephalic, atraumatic, face symmetric, no Cushingoid features. EYES:  Blue eyes.  Pupils equal round and reactive to light and accomodation.  No conjunctivitis or scleral icterus. ENT:  Oropharynx clear without lesion.  Tongue normal. Mucous membranes moist.  RESPIRATORY:  Faint dry crackles left lower lobe.  Otherwise, clear to auscultation without rales, wheezes or rhonchi. CARDIOVASCULAR:  Regular rate and rhythm without murmur, rub or gallop. ABDOMEN:  Soft, non-tender, with active bowel sounds, and no hepatosplenomegaly.  No masses. SKIN:  No rashes, ulcers or lesions. EXTREMITIES: Slight left lower extremity edema.  No skin discoloration or tenderness.  No palpable cords. LYMPH NODES: No palpable cervical, supraclavicular, axillary or inguinal adenopathy  NEUROLOGICAL: Unremarkable. PSYCH:  Appropriate.  Results for orders placed or performed during the hospital encounter of 03/05/16 (from the past 48 hour(s))  CBC with Differential     Status: Abnormal   Collection Time: 03/05/16 11:37 AM  Result Value Ref  Range   WBC 26.8 (H) 3.6 - 11.0 K/uL   RBC 3.99 3.80 - 5.20 MIL/uL   Hemoglobin 12.4 12.0 - 16.0 g/dL   HCT 37.1 35.0 - 47.0 %   MCV 93.1 80.0 - 100.0 fL   MCH 31.1 26.0 - 34.0 pg   MCHC 33.4 32.0 - 36.0 g/dL   RDW 13.1 11.5 - 14.5 %   Platelets 364 150 - 440 K/uL   Neutrophils Relative % 89 %   Neutro Abs 24.0 (H) 1.4 - 6.5 K/uL   Lymphocytes Relative 3 %   Lymphs Abs 0.9 (L) 1.0 - 3.6 K/uL   Monocytes Relative 7 %   Monocytes Absolute 1.8 (H) 0.2 - 0.9 K/uL   Eosinophils Relative 0 %   Eosinophils Absolute 0.0 0 - 0.7 K/uL   Basophils Relative 1 %   Basophils Absolute 0.2 (H) 0 - 0.1 K/uL  Comprehensive metabolic panel     Status: Abnormal   Collection Time: 03/05/16 11:37 AM  Result Value Ref Range   Sodium 134 (L) 135 - 145 mmol/L   Potassium 3.4 (L) 3.5 - 5.1  mmol/L   Chloride 100 (L) 101 - 111 mmol/L   CO2 19 (L) 22 - 32 mmol/L   Glucose, Bld 155 (H) 65 - 99 mg/dL   BUN 10 6 - 20 mg/dL   Creatinine, Ser 0.65 0.44 - 1.00 mg/dL   Calcium 9.3 8.9 - 10.3 mg/dL   Total Protein 7.1 6.5 - 8.1 g/dL   Albumin 3.5 3.5 - 5.0 g/dL   AST 28 15 - 41 U/L   ALT 19 14 - 54 U/L   Alkaline Phosphatase 74 38 - 126 U/L   Total Bilirubin 0.8 0.3 - 1.2 mg/dL   GFR calc non Af Amer >60 >60 mL/min   GFR calc Af Amer >60 >60 mL/min    Comment: (NOTE) The eGFR has been calculated using the CKD EPI equation. This calculation has not been validated in all clinical situations. eGFR's persistently <60 mL/min signify possible Chronic Kidney Disease.    Anion gap 15 5 - 15  Lipase, blood     Status: None   Collection Time: 03/05/16 11:37 AM  Result Value Ref Range   Lipase 15 11 - 51 U/L  Brain natriuretic peptide     Status: None   Collection Time: 03/05/16 11:37 AM  Result Value Ref Range   B Natriuretic Peptide 44.0 0.0 - 100.0 pg/mL  Troponin I     Status: None   Collection Time: 03/05/16 11:37 AM  Result Value Ref Range   Troponin I <0.03 <0.031 ng/mL    Comment:        NO  INDICATION OF MYOCARDIAL INJURY.   TSH     Status: None   Collection Time: 03/05/16 11:37 AM  Result Value Ref Range   TSH 2.379 0.350 - 4.500 uIU/mL  Urinalysis complete, with microscopic ([facility] only)     Status: Abnormal   Collection Time: 03/05/16  1:18 PM  Result Value Ref Range   Color, Urine COLORLESS (A) YELLOW   APPearance CLEAR (A) CLEAR   Glucose, UA NEGATIVE NEGATIVE mg/dL   Bilirubin Urine NEGATIVE NEGATIVE   Ketones, ur NEGATIVE NEGATIVE mg/dL   Specific Gravity, Urine 1.024 1.005 - 1.030   Hgb urine dipstick NEGATIVE NEGATIVE   pH 7.0 5.0 - 8.0   Protein, ur NEGATIVE NEGATIVE mg/dL   Nitrite NEGATIVE NEGATIVE   Leukocytes, UA NEGATIVE NEGATIVE   RBC / HPF NONE SEEN 0 - 5 RBC/hpf   WBC, UA NONE SEEN 0 - 5 WBC/hpf   Bacteria, UA RARE (A) NONE SEEN   Squamous Epithelial / LPF 0-5 (A) NONE SEEN  Lactic acid, plasma     Status: None   Collection Time: 03/05/16  1:37 PM  Result Value Ref Range   Lactic Acid, Venous 1.3 0.5 - 2.0 mmol/L   Ct Angio Chest Pe W/cm &/or Wo Cm  03/05/2016  CLINICAL DATA:  Chronic cough and chronic abdomen and pelvic pain. The pain is in the upper abdomen, increasing over the past several months. Left leg swelling. Clinical concern for deep venous thrombosis. EXAM: CT ANGIOGRAPHY CHEST CT ABDOMEN AND PELVIS WITH CONTRAST TECHNIQUE: Multidetector CT imaging of the chest was performed using the standard protocol during bolus administration of intravenous contrast. Multiplanar CT image reconstructions and MIPs were obtained to evaluate the vascular anatomy. Multidetector CT imaging of the abdomen and pelvis was performed using the standard protocol during bolus administration of intravenous contrast. CONTRAST:  100 cc Isovue 370 COMPARISON:  Abdomen and pelvis CT dated 11/24/2012. Bilateral lower extremity venous  Doppler examination obtained earlier today. FINDINGS: CTA CHEST FINDINGS Normally opacified pulmonary arteries with no pulmonary arterial  filling defects seen. No enlarged lymph nodes. There is an oval mass in the medial aspect of the superior segment of the left lower lobe, encasing the left lower lobe pulmonary arteries and veins. There is diffuse narrowing of the left lower lobe pulmonary vein. On image number 76, this mass measures 5.7 x 3.3 cm. This measures 8.7 cm in length on sagittal image number 88. On coronal image number 68, this is seen extending into the inferior, medial aspect of the left upper lobe, posteriorly. This extends into the left hilum and is partially encasing the proximal left lower lobe bronchus, with mild to moderate diffuse narrowing of the bronchus. There is adjacent patchy airspace consolidation in the left lower lobe. The right lung is clear. A 4 mm nodule was demonstrated in the posterior aspect of the left upper lobe on image number 66. There are also several small areas of nodularity in the posterior aspect of the left lower lobe on image number 121. The largest of these is linear in configuration and measures 10 mm in maximum diameter on that image. There are 3 small adjacent nodules in the left lower lobe on image number 104, the largest measuring 4 mm. No pleural fluid.  Normal sized heart.  Small hiatal hernia. Minimal thoracic spine degenerative changes. No evidence of bony metastatic disease. CT ABDOMEN and PELVIS FINDINGS Diffuse low density of the liver. Normal appearing gallbladder. Unremarkable spleen, pancreas, adrenal glands, left kidney, ureters and urinary bladder. Small right renal cysts. Inferior vena cava filter. No deep venous thrombosis visualized. No gastrointestinal abnormalities or enlarged lymph nodes. Normal appearing appendix. Unremarkable uterus and ovaries. Disc space narrowing and discogenic sclerosis at the L1-2 level. Hemangiomas in the L3 and L4 vertebral bodies. No evidence of bony metastatic disease. Review of the MIP images confirms the above findings. IMPRESSION: 1. 8.7 x 5.7 x 3.3  cm mass in the medial aspect of the superior segment of the left lower lobe, it extending into the medial aspect of the left upper lobe and into the left hilum, with vascular and bronchial bronchial encasement and narrowing. 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 2. Multiple small left lung nodules, potentially metastases. 3. Diffuse hepatic steatosis. 4. Small sliding hiatal hernia. 5. No evidence of malignancy or metastatic disease in the abdomen or pelvis. These results were called by telephone at the time of interpretation on 03/05/2016 at 1:51 pm to Dr. Delman Kitten , who verbally acknowledged these results. Electronically Signed   By: Claudie Revering M.D.   On: 03/05/2016 13:51   Ct Abdomen Pelvis W Contrast  03/05/2016  CLINICAL DATA:  Chronic cough and chronic abdomen and pelvic pain. The pain is in the upper abdomen, increasing over the past several months. Left leg swelling. Clinical concern for deep venous thrombosis. EXAM: CT ANGIOGRAPHY CHEST CT ABDOMEN AND PELVIS WITH CONTRAST TECHNIQUE: Multidetector CT imaging of the chest was performed using the standard protocol during bolus administration of intravenous contrast. Multiplanar CT image reconstructions and MIPs were obtained to evaluate the vascular anatomy. Multidetector CT imaging of the abdomen and pelvis was performed using the standard protocol during bolus administration of intravenous contrast. CONTRAST:  100 cc Isovue 370 COMPARISON:  Abdomen and pelvis CT dated 11/24/2012. Bilateral lower extremity venous Doppler examination obtained earlier today. FINDINGS: CTA CHEST FINDINGS Normally opacified pulmonary arteries with no pulmonary arterial filling  defects seen. No enlarged lymph nodes. There is an oval mass in the medial aspect of the superior segment of the left lower lobe, encasing the left lower lobe pulmonary arteries and veins. There is diffuse narrowing of the left lower lobe pulmonary vein. On image  number 76, this mass measures 5.7 x 3.3 cm. This measures 8.7 cm in length on sagittal image number 88. On coronal image number 68, this is seen extending into the inferior, medial aspect of the left upper lobe, posteriorly. This extends into the left hilum and is partially encasing the proximal left lower lobe bronchus, with mild to moderate diffuse narrowing of the bronchus. There is adjacent patchy airspace consolidation in the left lower lobe. The right lung is clear. A 4 mm nodule was demonstrated in the posterior aspect of the left upper lobe on image number 66. There are also several small areas of nodularity in the posterior aspect of the left lower lobe on image number 121. The largest of these is linear in configuration and measures 10 mm in maximum diameter on that image. There are 3 small adjacent nodules in the left lower lobe on image number 104, the largest measuring 4 mm. No pleural fluid.  Normal sized heart.  Small hiatal hernia. Minimal thoracic spine degenerative changes. No evidence of bony metastatic disease. CT ABDOMEN and PELVIS FINDINGS Diffuse low density of the liver. Normal appearing gallbladder. Unremarkable spleen, pancreas, adrenal glands, left kidney, ureters and urinary bladder. Small right renal cysts. Inferior vena cava filter. No deep venous thrombosis visualized. No gastrointestinal abnormalities or enlarged lymph nodes. Normal appearing appendix. Unremarkable uterus and ovaries. Disc space narrowing and discogenic sclerosis at the L1-2 level. Hemangiomas in the L3 and L4 vertebral bodies. No evidence of bony metastatic disease. Review of the MIP images confirms the above findings. IMPRESSION: 1. 8.7 x 5.7 x 3.3 cm mass in the medial aspect of the superior segment of the left lower lobe, it extending into the medial aspect of the left upper lobe and into the left hilum, with vascular and bronchial bronchial encasement and narrowing. This has imaging features most compatible with  a primary lung carcinoma. 2. Multiple small left lung nodules, potentially metastases. 3. Diffuse hepatic steatosis. 4. Small sliding hiatal hernia. 5. No evidence of malignancy or metastatic disease in the abdomen or pelvis. These results were called by telephone at the time of interpretation on 03/05/2016 at 1:51 pm to Dr. Delman Kitten , who verbally acknowledged these results. Electronically Signed   By: Claudie Revering M.D.   On: 03/05/2016 13:51   US Venous Img Lower Bilateral  03/05/2016  CLINICAL DATA:  Swelling LEFT leg for 3 years, question DVT, history of prior deep venous thrombosis, EXAM: BILATERAL LOWER EXTREMITY VENOUS DOPPLER ULTRASOUND TECHNIQUE: Gray-scale sonography with graded compression, as well as color Doppler and duplex ultrasound were performed to evaluate the lower extremity deep venous systems from the level of the common femoral vein and including the common femoral, femoral, profunda femoral, popliteal and calf veins including the posterior tibial, peroneal and gastrocnemius veins when visible. The superficial great saphenous vein was also interrogated. Spectral Doppler was utilized to evaluate flow at rest and with distal augmentation maneuvers in the common femoral, femoral and popliteal veins. COMPARISON:  None. FINDINGS: RIGHT LOWER EXTREMITY Common Femoral Vein: No evidence of thrombus. Normal compressibility, respiratory phasicity and response to augmentation. Saphenofemoral Junction: No evidence of thrombus. Normal compressibility and flow on color Doppler imaging. Profunda Femoral Vein: No evidence  of thrombus. Normal compressibility and flow on color Doppler imaging. Femoral Vein: No evidence of thrombus. Normal compressibility, respiratory phasicity and response to augmentation. Popliteal Vein: No evidence of thrombus. Normal compressibility, respiratory phasicity and response to augmentation. Calf Veins: No evidence of thrombus. Normal compressibility and flow on color Doppler  imaging. Superficial Great Saphenous Vein: No evidence of thrombus. Normal compressibility and flow on color Doppler imaging. Venous Reflux:  None. Other Findings:  None. LEFT LOWER EXTREMITY Common Femoral Vein: No evidence of thrombus. Normal compressibility, respiratory phasicity and response to augmentation. Saphenofemoral Junction: No evidence of thrombus. Normal compressibility and flow on color Doppler imaging. Profunda Femoral Vein: No evidence of thrombus. Normal compressibility and flow on color Doppler imaging. Femoral Vein: Small size which may represent sequella of prior deep venous thrombosis. No evidence of thrombus. Normal compressibility, respiratory phasicity and response to augmentation. Popliteal Vein: No evidence of thrombus. Normal compressibility, respiratory phasicity and response to augmentation. Calf Veins: No evidence of thrombus. Normal compressibility and flow on color Doppler imaging. Superficial Great Saphenous Vein: No evidence of thrombus. Normal compressibility and flow on color Doppler imaging. Venous Reflux:  None. Other Findings:  None. IMPRESSION: No evidence of deep venous thrombosis in LEFT lower extremity. Electronically Signed   By: Lavonia Dana M.D.   On: 03/05/2016 13:11    Assessment:  The patient is a 55 y.o. 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  She has a 30-40 pack year smoking history and a 3 month history of cough and pressure-like sensation in her abdomen.   Chest CT angiogram no evidence of thrombosis revealed a 8.7 x 5.7 x 3.3 cm mass in the medial aspect of the superior segment of the left lower lobe extending into the medial aspect of the left upper lobe and into the left hilum.  There was vascular and endobronchial encasement and narrowing. There were multiple small left lung nodules.  Abdomen and pelvic CT scan revealed no evidence of metastatic disease in the abdomen or pelvis.  She has a history of a left lower extremity thromboses in  12/2012.  Because of the extensive clot burden, she underwent TPA with angioplasty on 12/28/2012.  An IVC filter was placed on 12/29/2012.  Hypercoagulable workup was negative for factor V Leiden, prothrombin gene mutation, lupus anticoagulant, and antithrombin III. She was on Xarelto for 6 months. Bilateral lower extremity duplex on 02/03/2016 revealed no evidence of thrombosis.  Symptomatically, she has a dry scratchy cough.  Exam notes a few dry crackles in the left lower lobe and chronic mild left lower extremity edema.  Plan:   1.  Discuss imaging studies and likely diagnosis or lung cancer.  Discussed obtaining a CT guided biopsy in the outpatient department early next week.  Discuss follow-up PET scan and head MRI.  Briefly discussed treatment based on pathology and staging.  Clinic contact information was provided. 2.  Continue antibiotics with plan to switch to oral antibiotics at discharge. 3.  Disposition:  Anticipate follow-up in the outpatient clinic at the end of next week once her biopsy has been obtained and results are available.  Thank you for allowing me to participate in Christina Mckee 's care.  I will follow her closely with you while hospitalized and after discharge in the outpatient department.  Lequita Asal, MD  03/05/2016

## 2016-03-06 NOTE — Progress Notes (Signed)
MD order received in [facility] to discharge pt home today; verbally reviewed AVS with pt including medications/gave Rx to pt for narcotic Tussionex, other Rxs can be picked up at [facility] on [address] in [place], [place]; increase activity slowly; follow up appointments/pt to call on 03/09/15 to schedule appointments for 1 week with [facility]; gave return to work note to pt too; no questions voiced at this time; pt ambulated (her choice) to the visitor's entrance to get to her car in the emergency room parking lot

## 2016-03-06 NOTE — Progress Notes (Signed)
Patient ID: Christina Mckee, female   DOB: 1961-06-03, 55 y.o.   MRN: [id number]                                          [place]    Layal Javid was admitted to the Hospital on 03/05/2016 and Discharged  03/06/2016 and should be excused from work/school   for 4  days starting 03/05/2016 , may return to work/school without any restrictions.  Call Fritzi Mandes MD, [facility]  [phone] with questions.  Yazan Gatling M.D on 03/06/2016,at 11:49 AM

## 2016-03-06 NOTE — Discharge Summary (Signed)
[name] NAME: Christina Mckee    MR#:  [id number]  DATE OF BIRTH:  08/15/1961  DATE OF ADMISSION:  03/05/2016 ADMITTING PHYSICIAN: Gladstone Lighter, MD  DATE OF DISCHARGE: 03/06/16  PRIMARY CARE PHYSICIAN: No PCP Per Patient    ADMISSION DIAGNOSIS:  Lung mass [R91.8] Tachycardia [R00.0] SIRS (systemic inflammatory response syndrome) (HCC) [R65.10]  DISCHARGE DIAGNOSIS:  Left sided lung mass (new) -work up as out pt Post-obstructive pneumonia H/o smoking  SECONDARY DIAGNOSIS:   Past Medical History  Diagnosis Date  . DVT (deep venous thrombosis) ([place])     in 2015    HOSPITAL COURSE:  [name] is a 55 y.o. female with a known history of unprovoked DVT in 2014, treated with Xarelto and currently off of anticoagulation presents to the hospital secondary to generalized weakness, dry cough and fatigue going on for almost 2 weeks now.  #1 left lower lobe pneumonia-post obstructive pneumonia. -Early sepsis on admission. Tachycardic and also elevated white count.  -change to po levaquin  #2 left lung mass-left lower lobe mass encasing the lower bronchus. Pulmonary consulted -will need bronchoscopy and biopsy as outpt -Oncology consulted and work up as outpt  #3 hypokalemia-being replaced  #4 hypertension  And tachycardia bp stable Cont low dose bb  #5 DVT prophylaxis-on Lovenox. Prior history of DVT more than 3 years ago in her left leg and had thrombectomy done and finish Xarelto., repeat Dopplers did not show any DVT this time. Patient is status post IVC filter  #6 tobacco use disorder-started on nicotine patch  D/c home  CONSULTS OBTAINED:  Treatment Team:  Laverle Hobby, MD Lequita Asal, MD  DRUG ALLERGIES:   Allergies  Allergen Reactions  . Penicillins Hives and Other (See Comments)    Has patient had a PCN reaction causing immediate rash, facial/tongue/throat swelling, SOB or  lightheadedness with hypotension: No Has patient had a PCN reaction causing severe rash involving mucus membranes or skin necrosis: No Has patient had a PCN reaction that required hospitalization No Has patient had a PCN reaction occurring within the last 10 years: No If all of the above answers are "NO", then may proceed with Cephalosporin use.    DISCHARGE MEDICATIONS:   Current Discharge Medication List    START taking these medications   Details  guaiFENesin-codeine 100-10 MG/5ML syrup Take 10 mLs by mouth every 6 (six) hours as needed for cough. Qty: 120 mL, Refills: 0    levofloxacin (LEVAQUIN) 750 MG tablet Take 1 tablet (750 mg total) by mouth daily. Qty: 6 tablet, Refills: 0    metoprolol tartrate (LOPRESSOR) 25 MG tablet Take 1 tablet (25 mg total) by mouth 2 (two) times daily. Qty: 60 tablet, Refills: 2    mometasone-formoterol (DULERA) 200-5 MCG/ACT AERO Inhale 2 puffs into the lungs 2 (two) times daily. Qty: 1 Inhaler, Refills: 2    nicotine (NICODERM CQ - DOSED IN MG/24 HOURS) 21 mg/24hr patch Place 1 patch (21 mg total) onto the skin daily. Qty: 28 patch, Refills: 0        If you experience worsening of your admission symptoms, develop shortness of breath, life threatening emergency, suicidal or homicidal thoughts you must seek medical attention immediately by calling 911 or calling your MD immediately  if symptoms less severe.  You Must read complete instructions/literature along with all the possible adverse reactions/side effects for all the Medicines you take and that have been prescribed to you. Take  any new Medicines after you have completely understood and accept all the possible adverse reactions/side effects.   Please note  You were cared for by a hospitalist during your hospital stay. If you have any questions about your discharge medications or the care you received while you were in the hospital after you are discharged, you can call the unit and asked  to speak with the hospitalist on call if the hospitalist that took care of you is not available. Once you are discharged, your primary care physician will handle any further medical issues. Please note that NO REFILLS for any discharge medications will be authorized once you are discharged, as it is imperative that you return to your primary care physician (or establish a relationship with a primary care physician if you do not have one) for your aftercare needs so that they can reassess your need for medications and monitor your lab values. Today   SUBJECTIVE   Anxious and wants to go home  VITAL SIGNS:  Blood pressure 120/67, pulse 101, temperature 98.9 F (37.2 C), temperature source Oral, resp. rate 18, height '5\' 5"'$  (1.651 m), weight 54.432 kg (120 lb), SpO2 98 %.  I/O:    Intake/Output Summary (Last 24 hours) at 03/06/16 1045 Last data filed at 03/06/16 0800  Gross per 24 hour  Intake    480 ml  Output      0 ml  Net    480 ml    PHYSICAL EXAMINATION:  GENERAL:  55 y.o.-year-old patient lying in the bed with no acute distress.  EYES: Pupils equal, round, reactive to light and accommodation. No scleral icterus. Extraocular muscles intact.  HEENT: Head atraumatic, normocephalic. Oropharynx and nasopharynx clear.  NECK:  Supple, no jugular venous distention. No thyroid enlargement, no tenderness.  LUNGS: Normal breath sounds bilaterally, no wheezing, rales,rhonchi or crepitation. No use of accessory muscles of respiration.  CARDIOVASCULAR: S1, S2 normal. No murmurs, rubs, or gallops. Mild tachy ABDOMEN: Soft, non-tender, non-distended. Bowel sounds present. No organomegaly or mass.  EXTREMITIES: No pedal edema, cyanosis, or clubbing.  NEUROLOGIC: Cranial nerves II through XII are intact. Muscle strength 5/5 in all extremities. Sensation intact. Gait not checked.  PSYCHIATRIC: The patient is alert and oriented x 3.  SKIN: No obvious rash, lesion, or ulcer.   DATA REVIEW:   CBC    Recent Labs Lab 03/06/16 0622  WBC 13.6*  HGB 10.8*  HCT 32.2*  PLT 297    Chemistries   Recent Labs Lab 03/05/16 1137 03/06/16 0622  NA 134* 133*  K 3.4* 3.3*  CL 100* 103  CO2 19* 23  GLUCOSE 155* 113*  BUN 10 <5*  CREATININE 0.65 0.38*  CALCIUM 9.3 8.0*  AST 28  --   ALT 19  --   ALKPHOS 74  --   BILITOT 0.8  --     Microbiology Results   Recent Results (from the past 240 hour(s))  Culture, blood (Routine X 2) w Reflex to ID Panel     Status: None (Preliminary result)   Collection Time: 03/05/16  1:18 PM  Result Value Ref Range Status   Specimen Description BLOOD LEFT FATTY CASTS  Final   Special Requests   Final    BOTTLES DRAWN AEROBIC AND ANAEROBIC [id number]   Culture NO GROWTH < 12 HOURS  Final   Report Status PENDING  Incomplete    RADIOLOGY:  Ct Angio Chest Pe W/cm &/or Wo Cm  03/05/2016  CLINICAL DATA:  Chronic cough  and chronic abdomen and pelvic pain. The pain is in the upper abdomen, increasing over the past several months. Left leg swelling. Clinical concern for deep venous thrombosis. EXAM: CT ANGIOGRAPHY CHEST CT ABDOMEN AND PELVIS WITH CONTRAST TECHNIQUE: Multidetector CT imaging of the chest was performed using the standard protocol during bolus administration of intravenous contrast. Multiplanar CT image reconstructions and MIPs were obtained to evaluate the vascular anatomy. Multidetector CT imaging of the abdomen and pelvis was performed using the standard protocol during bolus administration of intravenous contrast. CONTRAST:  100 cc Isovue 370 COMPARISON:  Abdomen and pelvis CT dated 11/24/2012. Bilateral lower extremity venous Doppler examination obtained earlier today. FINDINGS: CTA CHEST FINDINGS Normally opacified pulmonary arteries with no pulmonary arterial filling defects seen. No enlarged lymph nodes. There is an oval mass in the medial aspect of the superior segment of the left lower lobe, encasing the left lower lobe pulmonary  arteries and veins. There is diffuse narrowing of the left lower lobe pulmonary vein. On image number 76, this mass measures 5.7 x 3.3 cm. This measures 8.7 cm in length on sagittal image number 88. On coronal image number 68, this is seen extending into the inferior, medial aspect of the left upper lobe, posteriorly. This extends into the left hilum and is partially encasing the proximal left lower lobe bronchus, with mild to moderate diffuse narrowing of the bronchus. There is adjacent patchy airspace consolidation in the left lower lobe. The right lung is clear. A 4 mm nodule was demonstrated in the posterior aspect of the left upper lobe on image number 66. There are also several small areas of nodularity in the posterior aspect of the left lower lobe on image number 121. The largest of these is linear in configuration and measures 10 mm in maximum diameter on that image. There are 3 small adjacent nodules in the left lower lobe on image number 104, the largest measuring 4 mm. No pleural fluid.  Normal sized heart.  Small hiatal hernia. Minimal thoracic spine degenerative changes. No evidence of bony metastatic disease. CT ABDOMEN and PELVIS FINDINGS Diffuse low density of the liver. Normal appearing gallbladder. Unremarkable spleen, pancreas, adrenal glands, left kidney, ureters and urinary bladder. Small right renal cysts. Inferior vena cava filter. No deep venous thrombosis visualized. No gastrointestinal abnormalities or enlarged lymph nodes. Normal appearing appendix. Unremarkable uterus and ovaries. Disc space narrowing and discogenic sclerosis at the L1-2 level. Hemangiomas in the L3 and L4 vertebral bodies. No evidence of bony metastatic disease. Review of the MIP images confirms the above findings. IMPRESSION: 1. 8.7 x 5.7 x 3.3 cm mass in the medial aspect of the superior segment of the left lower lobe, it extending into the medial aspect of the left upper lobe and into the left hilum, with vascular  and bronchial bronchial encasement and narrowing. 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 2. Multiple small left lung nodules, potentially metastases. 3. Diffuse hepatic steatosis. 4. Small sliding hiatal hernia. 5. No evidence of malignancy or metastatic disease in the abdomen or pelvis. These results were called by telephone at the time of interpretation on 03/05/2016 at 1:51 pm to Dr. Delman Kitten , who verbally acknowledged these results. Electronically Signed   By: Claudie Revering M.D.   On: 03/05/2016 13:51   Ct Abdomen Pelvis W Contrast  03/05/2016  CLINICAL DATA:  Chronic cough and chronic abdomen and pelvic pain. The pain is in the upper abdomen, increasing over the past several  months. Left leg swelling. Clinical concern for deep venous thrombosis. EXAM: CT ANGIOGRAPHY CHEST CT ABDOMEN AND PELVIS WITH CONTRAST TECHNIQUE: Multidetector CT imaging of the chest was performed using the standard protocol during bolus administration of intravenous contrast. Multiplanar CT image reconstructions and MIPs were obtained to evaluate the vascular anatomy. Multidetector CT imaging of the abdomen and pelvis was performed using the standard protocol during bolus administration of intravenous contrast. CONTRAST:  100 cc Isovue 370 COMPARISON:  Abdomen and pelvis CT dated 11/24/2012. Bilateral lower extremity venous Doppler examination obtained earlier today. FINDINGS: CTA CHEST FINDINGS Normally opacified pulmonary arteries with no pulmonary arterial filling defects seen. No enlarged lymph nodes. There is an oval mass in the medial aspect of the superior segment of the left lower lobe, encasing the left lower lobe pulmonary arteries and veins. There is diffuse narrowing of the left lower lobe pulmonary vein. On image number 76, this mass measures 5.7 x 3.3 cm. This measures 8.7 cm in length on sagittal image number 88. On coronal image number 68, this is seen extending into the inferior,  medial aspect of the left upper lobe, posteriorly. This extends into the left hilum and is partially encasing the proximal left lower lobe bronchus, with mild to moderate diffuse narrowing of the bronchus. There is adjacent patchy airspace consolidation in the left lower lobe. The right lung is clear. A 4 mm nodule was demonstrated in the posterior aspect of the left upper lobe on image number 66. There are also several small areas of nodularity in the posterior aspect of the left lower lobe on image number 121. The largest of these is linear in configuration and measures 10 mm in maximum diameter on that image. There are 3 small adjacent nodules in the left lower lobe on image number 104, the largest measuring 4 mm. No pleural fluid.  Normal sized heart.  Small hiatal hernia. Minimal thoracic spine degenerative changes. No evidence of bony metastatic disease. CT ABDOMEN and PELVIS FINDINGS Diffuse low density of the liver. Normal appearing gallbladder. Unremarkable spleen, pancreas, adrenal glands, left kidney, ureters and urinary bladder. Small right renal cysts. Inferior vena cava filter. No deep venous thrombosis visualized. No gastrointestinal abnormalities or enlarged lymph nodes. Normal appearing appendix. Unremarkable uterus and ovaries. Disc space narrowing and discogenic sclerosis at the L1-2 level. Hemangiomas in the L3 and L4 vertebral bodies. No evidence of bony metastatic disease. Review of the MIP images confirms the above findings. IMPRESSION: 1. 8.7 x 5.7 x 3.3 cm mass in the medial aspect of the superior segment of the left lower lobe, it extending into the medial aspect of the left upper lobe and into the left hilum, with vascular and bronchial bronchial encasement and narrowing. 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 2. Multiple small left lung nodules, potentially metastases. 3. Diffuse hepatic steatosis. 4. Small sliding hiatal hernia. 5. No evidence of  malignancy or metastatic disease in the abdomen or pelvis. These results were called by telephone at the time of interpretation on 03/05/2016 at 1:51 pm to Dr. Delman Kitten , who verbally acknowledged these results. Electronically Signed   By: Claudie Revering M.D.   On: 03/05/2016 13:51   US Venous Img Lower Bilateral  03/05/2016  CLINICAL DATA:  Swelling LEFT leg for 3 years, question DVT, history of prior deep venous thrombosis, EXAM: BILATERAL LOWER EXTREMITY VENOUS DOPPLER ULTRASOUND TECHNIQUE: Gray-scale sonography with graded compression, as well as color Doppler and duplex ultrasound were performed to  evaluate the lower extremity deep venous systems from the level of the common femoral vein and including the common femoral, femoral, profunda femoral, popliteal and calf veins including the posterior tibial, peroneal and gastrocnemius veins when visible. The superficial great saphenous vein was also interrogated. Spectral Doppler was utilized to evaluate flow at rest and with distal augmentation maneuvers in the common femoral, femoral and popliteal veins. COMPARISON:  None. FINDINGS: RIGHT LOWER EXTREMITY Common Femoral Vein: No evidence of thrombus. Normal compressibility, respiratory phasicity and response to augmentation. Saphenofemoral Junction: No evidence of thrombus. Normal compressibility and flow on color Doppler imaging. Profunda Femoral Vein: No evidence of thrombus. Normal compressibility and flow on color Doppler imaging. Femoral Vein: No evidence of thrombus. Normal compressibility, respiratory phasicity and response to augmentation. Popliteal Vein: No evidence of thrombus. Normal compressibility, respiratory phasicity and response to augmentation. Calf Veins: No evidence of thrombus. Normal compressibility and flow on color Doppler imaging. Superficial Great Saphenous Vein: No evidence of thrombus. Normal compressibility and flow on color Doppler imaging. Venous Reflux:  None. Other Findings:   None. LEFT LOWER EXTREMITY Common Femoral Vein: No evidence of thrombus. Normal compressibility, respiratory phasicity and response to augmentation. Saphenofemoral Junction: No evidence of thrombus. Normal compressibility and flow on color Doppler imaging. Profunda Femoral Vein: No evidence of thrombus. Normal compressibility and flow on color Doppler imaging. Femoral Vein: Small size which may represent sequella of prior deep venous thrombosis. No evidence of thrombus. Normal compressibility, respiratory phasicity and response to augmentation. Popliteal Vein: No evidence of thrombus. Normal compressibility, respiratory phasicity and response to augmentation. Calf Veins: No evidence of thrombus. Normal compressibility and flow on color Doppler imaging. Superficial Great Saphenous Vein: No evidence of thrombus. Normal compressibility and flow on color Doppler imaging. Venous Reflux:  None. Other Findings:  None. IMPRESSION: No evidence of deep venous thrombosis in LEFT lower extremity. Electronically Signed   By: Lavonia Dana M.D.   On: 03/05/2016 13:11     Management plans discussed with the patient, family and they are in agreement.  CODE STATUS:     Code Status Orders        Start     Ordered   03/05/16 1607  Full code   Continuous     03/05/16 1606    Code Status History    Date Active Date Inactive Code Status Order ID Comments User Context   This patient has a current code status but no historical code status.      TOTAL TIME TAKING CARE OF THIS PATIENT: 40 minutes.    Kento Gossman M.D on 03/06/2016 at 10:45 AM  Between 7am to 6pm - Pager - [phone] After 6pm go to [email or web address] - password [email or web address] [facility]  [place] [facility]  Office  [phone]  CC: Primary care physician; No PCP Per Patient

## 2016-03-08 ENCOUNTER — Telehealth: Payer: Self-pay | Admitting: *Deleted

## 2016-03-08 ENCOUNTER — Other Ambulatory Visit: Payer: Self-pay | Admitting: Internal Medicine

## 2016-03-08 ENCOUNTER — Telehealth: Payer: Self-pay | Admitting: Internal Medicine

## 2016-03-08 DIAGNOSIS — R918 Other nonspecific abnormal finding of lung field: Secondary | ICD-10-CM

## 2016-03-08 NOTE — Telephone Encounter (Signed)
LM on VM since work number would not go through informing pt that I have sent in the request for the CT biopsy and to have her pharmacy to fax the PA request to our fax number. Will await call back.

## 2016-03-08 NOTE — Telephone Encounter (Signed)
Patient needs to schedule ct biopsy and also needs PA with [facility] to fill Dulera .  Please call to discuss.

## 2016-03-08 NOTE — Telephone Encounter (Signed)
-----   Message from Laverle Hobby, MD sent at 03/05/2016  4:38 PM EDT ----- Regarding: needs CT guided lung biopsy.  Christina Mckee, please schedule the patient for a CT guided lung biopsy to be done next week if possible. She can follow up with me the following week.   Sean please follow up on the patient-- she has a large left lung mass.  Thanks.

## 2016-03-08 NOTE — Telephone Encounter (Signed)
Spoke with Bethena Roys in scheduling and she states the CT machine is down and that also when there is a lung mass it has to be cleared with Radiologist. Once she has any report she will give me a call back. States CT may not be scheduled until next week due to machine down. Will inform pt when she calls back.

## 2016-03-08 NOTE — Telephone Encounter (Signed)
Form filled out and faxed back to [place] in scheduling.

## 2016-03-08 NOTE — Telephone Encounter (Signed)
Spoke with Bethena Roys in scheduling and she states the CT machine is down and that also when there is a lung mass it has to be cleared with Radiologist. Once she has any report she will give me a call back. States CT may not be scheduled until next week due to machine down.

## 2016-03-08 NOTE — Telephone Encounter (Signed)
Waiting of form to be faxed from scheduling to schedule CT guided lung bx.

## 2016-03-09 MED ORDER — MOMETASONE FURO-FORMOTEROL FUM 200-5 MCG/ACT IN AERO: 2.0000 | INHALATION_SPRAY | Freq: Two times a day (BID) | RESPIRATORY_TRACT | Status: DC

## 2016-03-09 NOTE — Telephone Encounter (Signed)
Received message from Bethena Roys that pt has been scheduled for the CT Bx on 03/18/16 @ 8am, pt to arrive '@7am'$ . NPO after midnight. Pt has been informed. Nothing further needed.

## 2016-03-09 NOTE — Telephone Encounter (Signed)
Sent message to DR in regards to response from radiologist in regards to the CT guided Lung bx. Waiting on response from DR.

## 2016-03-09 NOTE — Telephone Encounter (Signed)
[facility] Radiology so that DR could speak with DR.

## 2016-03-09 NOTE — Telephone Encounter (Signed)
Please let Dr. Annamaria Boots know that the the patient has undergone pulmonary eval, pulmonary are the ones who referred the patient for CT guided needle biopsy.  Thanks.

## 2016-03-09 NOTE — Telephone Encounter (Signed)
Call from [place] in scheduling and she states the radiologist Greggory Keen states pt needs a pulmonary eval for bronch bx.

## 2016-03-09 NOTE — Telephone Encounter (Signed)
Spoke with Bethena Roys and informed her that the CT biopsy will be scheduled. nothing further needed.

## 2016-03-09 NOTE — Telephone Encounter (Signed)
DR states after speaking with Dr. Annamaria Boots that they will schedule CT guided Lung bx. Called and LM for Christina Mckee to call me back to inform her.

## 2016-03-09 NOTE — Telephone Encounter (Signed)
LMOM for pt to return call.  Dulera sent to pharmacy.

## 2016-03-10 LAB — CULTURE, BLOOD (ROUTINE X 2)
CULTURE: NO GROWTH
Culture  Setup Time: NONE SEEN
Culture: NO GROWTH

## 2016-03-15 ENCOUNTER — Telehealth: Payer: Self-pay | Admitting: Hematology and Oncology

## 2016-03-15 NOTE — Telephone Encounter (Signed)
Patient wants you to know her bx is scheduled for 03/18/16. You may cb: [phone] or [phone]

## 2016-03-16 NOTE — Telephone Encounter (Signed)
Dr Mike Gip knew it and I had already called the pt and made appt for her 5/2 and pt called back with the agreement to come that day

## 2016-03-17 ENCOUNTER — Other Ambulatory Visit: Payer: Self-pay | Admitting: Physician Assistant

## 2016-03-18 ENCOUNTER — Telehealth: Payer: Self-pay | Admitting: Internal Medicine

## 2016-03-18 ENCOUNTER — Ambulatory Visit
Admission: RE | Admit: 2016-03-18 | Discharge: 2016-03-18 | Disposition: A | Discharge status: Home / Self Care | Payer: BLUE CROSS/BLUE SHIELD | Source: Ambulatory Visit | Attending: Internal Medicine | Admitting: Internal Medicine

## 2016-03-18 ENCOUNTER — Telehealth: Payer: Self-pay | Admitting: *Deleted

## 2016-03-18 DIAGNOSIS — R918 Other nonspecific abnormal finding of lung field: Secondary | ICD-10-CM

## 2016-03-18 MED ORDER — AZITHROMYCIN 250 MG PO TABS: ORAL_TABLET | ORAL | Status: AC

## 2016-03-18 NOTE — Telephone Encounter (Signed)
Pt left a message that her bx was cancelled today due to ct machine down.  She would like refill of levaquin if poss. She has appt with our office on 5/2 and not sure if it should stay. What is the plan now. Please call back.

## 2016-03-18 NOTE — Telephone Encounter (Signed)
Lets send her in a Z pack. That's unfortunate about the delay, can it be done any faster if she has it done in [place]?

## 2016-03-18 NOTE — Telephone Encounter (Signed)
I called Dr. Ashby Dawes office twice today and left messages to see what plan was and did not get call back but I was able to see communication that md said to get bx in [place] and they called pt in zpak.  After I read this note after 5 pm I did call pt and got her voicemail letting her know that I would be happy to help the md office to get it set up if I can be of any asst. And that I would keep eye for when her bx sch,. So I cna reschedule her f/u at this office.  I am cancelling 5/2 appt with our office and she could call back for any questions.

## 2016-03-18 NOTE — Telephone Encounter (Signed)
Patient  States [facility] called her yesterday to cancel biopsy and she wants to make sure Dr. Juanell Fairly is aware.  Patient is concerned that we may not be informed.  Please call to discuss r/s appt or doing test at another location.

## 2016-03-18 NOTE — Telephone Encounter (Signed)
Pt informed of response. States she needs 1-3 day notice if we can schedule the CT bx in [place]. Rhonda please advise.

## 2016-03-18 NOTE — Telephone Encounter (Signed)
Pt wanted to make sure you were aware that her CT bx was cancelled due to the machine being down. She states she is unhappy for the fact they called her a 6pm last night and told her they had not informed you. She also states that since she has finished all of the abx that her symptoms are coming back. States she "feels like she is drowning" and she is gagging frequently and coughing constantly. Pt stated she tried to fill the guaifessin-codeine cough syrup but states her pharmacy has it on back order. I called her pharmacy and they state that [facility] [place] location has it available which I can tell the pt. Pt states she feels like she needs another abx. Please advise on symptoms and CT Bx.

## 2016-03-19 NOTE — Telephone Encounter (Signed)
LMOAM for Christina Mckee in scheduling to return my call. Rhonda J Cobb

## 2016-03-19 NOTE — Telephone Encounter (Signed)
CT Biopsy scheduled for 03/29/16 at 11:00 at [place] scheduled appointment with patient and she is aware of appointment date, time and location of procedure. Rhonda J Cobb   Pt to arrive at 9:30, NPO after midnight, no blood thinners. Pt to arrive at [facility] 2nd floor and will need a driver. Pt is aware of above. Rhonda J Cobb

## 2016-03-23 ENCOUNTER — Inpatient Hospital Stay: Payer: BLUE CROSS/BLUE SHIELD | Admitting: Hematology and Oncology

## 2016-03-24 ENCOUNTER — Other Ambulatory Visit: Payer: Self-pay | Admitting: Radiology

## 2016-03-26 ENCOUNTER — Ambulatory Visit: Payer: BLUE CROSS/BLUE SHIELD | Admitting: Internal Medicine

## 2016-03-29 ENCOUNTER — Other Ambulatory Visit: Payer: Self-pay | Admitting: *Deleted

## 2016-03-29 ENCOUNTER — Ambulatory Visit (HOSPITAL_COMMUNITY)
Admission: RE | Admit: 2016-03-29 | Discharge: 2016-03-29 | Disposition: A | Discharge status: Home / Self Care | Payer: BLUE CROSS/BLUE SHIELD | Source: Ambulatory Visit | Attending: Internal Medicine | Admitting: Internal Medicine

## 2016-03-29 ENCOUNTER — Encounter (HOSPITAL_COMMUNITY): Payer: Self-pay

## 2016-03-29 DIAGNOSIS — R079 Chest pain, unspecified: Secondary | ICD-10-CM | POA: Diagnosis not present

## 2016-03-29 DIAGNOSIS — Z86718 Personal history of other venous thrombosis and embolism: Secondary | ICD-10-CM | POA: Diagnosis not present

## 2016-03-29 DIAGNOSIS — F17210 Nicotine dependence, cigarettes, uncomplicated: Secondary | ICD-10-CM | POA: Diagnosis not present

## 2016-03-29 DIAGNOSIS — R05 Cough: Secondary | ICD-10-CM | POA: Diagnosis not present

## 2016-03-29 DIAGNOSIS — Z79899 Other long term (current) drug therapy: Secondary | ICD-10-CM | POA: Insufficient documentation

## 2016-03-29 DIAGNOSIS — C3432 Malignant neoplasm of lower lobe, left bronchus or lung: Secondary | ICD-10-CM | POA: Insufficient documentation

## 2016-03-29 DIAGNOSIS — Z880 Allergy status to penicillin: Secondary | ICD-10-CM | POA: Diagnosis not present

## 2016-03-29 DIAGNOSIS — R918 Other nonspecific abnormal finding of lung field: Secondary | ICD-10-CM | POA: Diagnosis present

## 2016-03-29 DIAGNOSIS — Z803 Family history of malignant neoplasm of breast: Secondary | ICD-10-CM | POA: Insufficient documentation

## 2016-03-29 DIAGNOSIS — C3490 Malignant neoplasm of unspecified part of unspecified bronchus or lung: Secondary | ICD-10-CM | POA: Insufficient documentation

## 2016-03-29 LAB — CBC
HEMATOCRIT: 31.6 % — AB (ref 36.0–46.0)
HEMOGLOBIN: 10.5 g/dL — AB (ref 12.0–15.0)
MCH: 31.1 pg (ref 26.0–34.0)
MCHC: 33.2 g/dL (ref 30.0–36.0)
MCV: 93.5 fL (ref 78.0–100.0)
Platelets: 394 10*3/uL (ref 150–400)
RBC: 3.38 MIL/uL — AB (ref 3.87–5.11)
RDW: 13.5 % (ref 11.5–15.5)
WBC: 21.5 10*3/uL — AB (ref 4.0–10.5)

## 2016-03-29 LAB — PROTIME-INR
INR: 1.24 (ref 0.00–1.49)
Prothrombin Time: 15.7 seconds — ABNORMAL HIGH (ref 11.6–15.2)

## 2016-03-29 LAB — APTT: APTT: 27 s (ref 24–37)

## 2016-03-29 MED ORDER — MIDAZOLAM HCL 2 MG/2ML IJ SOLN
INTRAMUSCULAR | Status: AC | PRN
  Administered 2016-03-29: 1 mg via INTRAVENOUS

## 2016-03-29 MED ORDER — FENTANYL CITRATE (PF) 100 MCG/2ML IJ SOLN
INTRAMUSCULAR | Status: AC | PRN
  Administered 2016-03-29 (×2): 25 ug via INTRAVENOUS

## 2016-03-29 MED ORDER — MIDAZOLAM HCL 2 MG/2ML IJ SOLN
INTRAMUSCULAR | Status: AC
  Filled 2016-03-29: qty 6

## 2016-03-29 MED ORDER — SODIUM CHLORIDE 0.9 % IV SOLN
INTRAVENOUS | Status: DC
  Administered 2016-03-29: 09:00:00 via INTRAVENOUS

## 2016-03-29 MED ORDER — FENTANYL CITRATE (PF) 100 MCG/2ML IJ SOLN
INTRAMUSCULAR | Status: AC
  Filled 2016-03-29: qty 4

## 2016-03-29 MED ORDER — LIDOCAINE HCL 1 % IJ SOLN
INTRAMUSCULAR | Status: AC
  Filled 2016-03-29: qty 20

## 2016-03-29 NOTE — H&P (Signed)
Chief Complaint: Patient was seen in consultation today for left lung mass biopsy at the request of Ramachandran,Pradeep  Referring Physician(s): Ramachandran,Pradeep  Supervising Physician: Daryll Brod  Patient Status: Out-pt  History of Present Illness: Christina Mckee is a 55 y.o. female   Presented to MD 4/14 with chest fullness and cough Work up: CTA Chest: IMPRESSION: 1. 8.7 x 5.7 x 3.3 cm mass in the medial aspect of the superior segment of the left lower lobe, it extending into the medial aspect of the left upper lobe and into the left hilum, with vascular and bronchial bronchial encasement and narrowing. 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 2. Multiple small left lung nodules, potentially metastases. 3. Diffuse hepatic steatosis. 4. Small sliding hiatal hernia. 5. No evidence of malignancy or metastatic disease in the abdomen or pelvis.  Request for biopsy of same Scheduled now for procedure ++ smoker   Past Medical History  Diagnosis Date  . DVT (deep venous thrombosis) ([facility])     in 2015    Past Surgical History  Procedure Laterality Date  . Cesarean section    . Ivc filter placement ([facility] hx)    . Breast cyst excision      left breast removal    Allergies: Penicillins  Medications: Prior to Admission medications   Medication Sig Start Date End Date Taking? Authorizing Provider  fluticasone (FLONASE) 50 MCG/ACT nasal spray Place 1 spray into both nostrils daily as needed for allergies or rhinitis.   Yes Historical Provider, MD  guaifenesin (ROBITUSSIN) 100 MG/5ML syrup Take 200 mg by mouth 3 (three) times daily as needed for cough or congestion.   Yes Historical Provider, MD  metoprolol tartrate (LOPRESSOR) 25 MG tablet Take 1 tablet (25 mg total) by mouth 2 (two) times daily. 03/06/16  Yes Fritzi Mandes, MD  mometasone-formoterol (DULERA) 200-5 MCG/ACT AERO Inhale 2 puffs into the lungs 2 (two) times  daily. Patient taking differently: Inhale 2 puffs into the lungs daily as needed for wheezing or shortness of breath.  03/09/16  Yes Laverle Hobby, MD  nicotine (NICODERM CQ - DOSED IN MG/24 HOURS) 21 mg/24hr patch Place 1 patch (21 mg total) onto the skin daily. 03/06/16  Yes Fritzi Mandes, MD     Family History  Problem Relation Age of Onset  . Breast cancer Mother   . Breast cancer Maternal Aunt   . Stomach cancer Paternal Uncle   . Leukemia Paternal Uncle     Social History   Social History  . Marital Status: Divorced    Spouse Name: N/A  . Number of Children: N/A  . Years of Education: N/A   Social History Main Topics  . Smoking status: Current Every Day Smoker -- 0.50 packs/day    Types: Cigarettes  . Smokeless tobacco: Never Used     Comment: 1/2 PPD to 1PPD  . Alcohol Use: 0.0 oz/week    0 Standard drinks or equivalent per week     Comment: daily about 4 beers/day  . Drug Use: No  . Sexual Activity: Not Asked   Other Topics Concern  . None   Social History Narrative   Lives by herself with her pets. Works as a [employer] park manager      Review of Systems: A 12 point ROS discussed and pertinent positives are indicated in the HPI above.  All other systems are negative.  Review of Systems  Constitutional: Negative for fever, activity change, appetite change and  fatigue.  Respiratory: Positive for cough and shortness of breath.   Musculoskeletal: Negative for back pain.  Neurological: Negative for weakness.  Psychiatric/Behavioral: Negative for behavioral problems and confusion.    Vital Signs: BP 120/67 mmHg  Pulse 108  Temp(Src) 99 F (37.2 C) (Oral)  Resp 16  Ht '5\' 5"'$  (1.651 m)  Wt 108 lb (48.988 kg)  BMI 17.97 kg/m2  SpO2 97%  Physical Exam  Constitutional: She is oriented to person, place, and time.  Cardiovascular: Normal rate, regular rhythm and normal heart sounds.   Pulmonary/Chest: Effort normal and breath sounds normal. She has no  wheezes.  Abdominal: Soft. Bowel sounds are normal. There is no tenderness.  Musculoskeletal: Normal range of motion.  Neurological: She is alert and oriented to person, place, and time.  Skin: Skin is warm and dry.  Psychiatric: She has a normal mood and affect. Her behavior is normal. Judgment and thought content normal.  Nursing note and vitals reviewed.   Mallampati Score:  MD Evaluation Airway: WNL Heart: WNL Abdomen: WNL Chest/ Lungs: WNL ASA  Classification: 2 Mallampati/Airway Score: One  Imaging: Ct Angio Chest Pe W/cm &/or Wo Cm  03/05/2016  CLINICAL DATA:  Chronic cough and chronic abdomen and pelvic pain. The pain is in the upper abdomen, increasing over the past several months. Left leg swelling. Clinical concern for deep venous thrombosis. EXAM: CT ANGIOGRAPHY CHEST CT ABDOMEN AND PELVIS WITH CONTRAST TECHNIQUE: Multidetector CT imaging of the chest was performed using the standard protocol during bolus administration of intravenous contrast. Multiplanar CT image reconstructions and MIPs were obtained to evaluate the vascular anatomy. Multidetector CT imaging of the abdomen and pelvis was performed using the standard protocol during bolus administration of intravenous contrast. CONTRAST:  100 cc Isovue 370 COMPARISON:  Abdomen and pelvis CT dated 11/24/2012. Bilateral lower extremity venous Doppler examination obtained earlier today. FINDINGS: CTA CHEST FINDINGS Normally opacified pulmonary arteries with no pulmonary arterial filling defects seen. No enlarged lymph nodes. There is an oval mass in the medial aspect of the superior segment of the left lower lobe, encasing the left lower lobe pulmonary arteries and veins. There is diffuse narrowing of the left lower lobe pulmonary vein. On image number 76, this mass measures 5.7 x 3.3 cm. This measures 8.7 cm in length on sagittal image number 88. On coronal image number 68, this is seen extending into the inferior, medial aspect of  the left upper lobe, posteriorly. This extends into the left hilum and is partially encasing the proximal left lower lobe bronchus, with mild to moderate diffuse narrowing of the bronchus. There is adjacent patchy airspace consolidation in the left lower lobe. The right lung is clear. A 4 mm nodule was demonstrated in the posterior aspect of the left upper lobe on image number 66. There are also several small areas of nodularity in the posterior aspect of the left lower lobe on image number 121. The largest of these is linear in configuration and measures 10 mm in maximum diameter on that image. There are 3 small adjacent nodules in the left lower lobe on image number 104, the largest measuring 4 mm. No pleural fluid.  Normal sized heart.  Small hiatal hernia. Minimal thoracic spine degenerative changes. No evidence of bony metastatic disease. CT ABDOMEN and PELVIS FINDINGS Diffuse low density of the liver. Normal appearing gallbladder. Unremarkable spleen, pancreas, adrenal glands, left kidney, ureters and urinary bladder. Small right renal cysts. Inferior vena cava filter. No deep venous thrombosis visualized.  No gastrointestinal abnormalities or enlarged lymph nodes. Normal appearing appendix. Unremarkable uterus and ovaries. Disc space narrowing and discogenic sclerosis at the L1-2 level. Hemangiomas in the L3 and L4 vertebral bodies. No evidence of bony metastatic disease. Review of the MIP images confirms the above findings. IMPRESSION: 1. 8.7 x 5.7 x 3.3 cm mass in the medial aspect of the superior segment of the left lower lobe, it extending into the medial aspect of the left upper lobe and into the left hilum, with vascular and bronchial bronchial encasement and narrowing. 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 2. Multiple small left lung nodules, potentially metastases. 3. Diffuse hepatic steatosis. 4. Small sliding hiatal hernia. 5. No evidence of malignancy or metastatic  disease in the abdomen or pelvis. These results were called by telephone at the time of interpretation on 03/05/2016 at 1:51 pm to Dr. Delman Kitten , who verbally acknowledged these results. Electronically Signed   By: Claudie Revering M.D.   On: 03/05/2016 13:51   Ct Abdomen Pelvis W Contrast  03/05/2016  CLINICAL DATA:  Chronic cough and chronic abdomen and pelvic pain. The pain is in the upper abdomen, increasing over the past several months. Left leg swelling. Clinical concern for deep venous thrombosis. EXAM: CT ANGIOGRAPHY CHEST CT ABDOMEN AND PELVIS WITH CONTRAST TECHNIQUE: Multidetector CT imaging of the chest was performed using the standard protocol during bolus administration of intravenous contrast. Multiplanar CT image reconstructions and MIPs were obtained to evaluate the vascular anatomy. Multidetector CT imaging of the abdomen and pelvis was performed using the standard protocol during bolus administration of intravenous contrast. CONTRAST:  100 cc Isovue 370 COMPARISON:  Abdomen and pelvis CT dated 11/24/2012. Bilateral lower extremity venous Doppler examination obtained earlier today. FINDINGS: CTA CHEST FINDINGS Normally opacified pulmonary arteries with no pulmonary arterial filling defects seen. No enlarged lymph nodes. There is an oval mass in the medial aspect of the superior segment of the left lower lobe, encasing the left lower lobe pulmonary arteries and veins. There is diffuse narrowing of the left lower lobe pulmonary vein. On image number 76, this mass measures 5.7 x 3.3 cm. This measures 8.7 cm in length on sagittal image number 88. On coronal image number 68, this is seen extending into the inferior, medial aspect of the left upper lobe, posteriorly. This extends into the left hilum and is partially encasing the proximal left lower lobe bronchus, with mild to moderate diffuse narrowing of the bronchus. There is adjacent patchy airspace consolidation in the left lower lobe. The right lung  is clear. A 4 mm nodule was demonstrated in the posterior aspect of the left upper lobe on image number 66. There are also several small areas of nodularity in the posterior aspect of the left lower lobe on image number 121. The largest of these is linear in configuration and measures 10 mm in maximum diameter on that image. There are 3 small adjacent nodules in the left lower lobe on image number 104, the largest measuring 4 mm. No pleural fluid.  Normal sized heart.  Small hiatal hernia. Minimal thoracic spine degenerative changes. No evidence of bony metastatic disease. CT ABDOMEN and PELVIS FINDINGS Diffuse low density of the liver. Normal appearing gallbladder. Unremarkable spleen, pancreas, adrenal glands, left kidney, ureters and urinary bladder. Small right renal cysts. Inferior vena cava filter. No deep venous thrombosis visualized. No gastrointestinal abnormalities or enlarged lymph nodes. Normal appearing appendix. Unremarkable uterus and ovaries. Disc space narrowing and  discogenic sclerosis at the L1-2 level. Hemangiomas in the L3 and L4 vertebral bodies. No evidence of bony metastatic disease. Review of the MIP images confirms the above findings. IMPRESSION: 1. 8.7 x 5.7 x 3.3 cm mass in the medial aspect of the superior segment of the left lower lobe, it extending into the medial aspect of the left upper lobe and into the left hilum, with vascular and bronchial bronchial encasement and narrowing. 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 2. Multiple small left lung nodules, potentially metastases. 3. Diffuse hepatic steatosis. 4. Small sliding hiatal hernia. 5. No evidence of malignancy or metastatic disease in the abdomen or pelvis. These results were called by telephone at the time of interpretation on 03/05/2016 at 1:51 pm to Dr. Delman Kitten , who verbally acknowledged these results. Electronically Signed   By: Claudie Revering M.D.   On: 03/05/2016 13:51   US Venous Img  Lower Bilateral  03/05/2016  CLINICAL DATA:  Swelling LEFT leg for 3 years, question DVT, history of prior deep venous thrombosis, EXAM: BILATERAL LOWER EXTREMITY VENOUS DOPPLER ULTRASOUND TECHNIQUE: Gray-scale sonography with graded compression, as well as color Doppler and duplex ultrasound were performed to evaluate the lower extremity deep venous systems from the level of the common femoral vein and including the common femoral, femoral, profunda femoral, popliteal and calf veins including the posterior tibial, peroneal and gastrocnemius veins when visible. The superficial great saphenous vein was also interrogated. Spectral Doppler was utilized to evaluate flow at rest and with distal augmentation maneuvers in the common femoral, femoral and popliteal veins. COMPARISON:  None. FINDINGS: RIGHT LOWER EXTREMITY Common Femoral Vein: No evidence of thrombus. Normal compressibility, respiratory phasicity and response to augmentation. Saphenofemoral Junction: No evidence of thrombus. Normal compressibility and flow on color Doppler imaging. Profunda Femoral Vein: No evidence of thrombus. Normal compressibility and flow on color Doppler imaging. Femoral Vein: No evidence of thrombus. Normal compressibility, respiratory phasicity and response to augmentation. Popliteal Vein: No evidence of thrombus. Normal compressibility, respiratory phasicity and response to augmentation. Calf Veins: No evidence of thrombus. Normal compressibility and flow on color Doppler imaging. Superficial Great Saphenous Vein: No evidence of thrombus. Normal compressibility and flow on color Doppler imaging. Venous Reflux:  None. Other Findings:  None. LEFT LOWER EXTREMITY Common Femoral Vein: No evidence of thrombus. Normal compressibility, respiratory phasicity and response to augmentation. Saphenofemoral Junction: No evidence of thrombus. Normal compressibility and flow on color Doppler imaging. Profunda Femoral Vein: No evidence of thrombus.  Normal compressibility and flow on color Doppler imaging. Femoral Vein: Small size which may represent sequella of prior deep venous thrombosis. No evidence of thrombus. Normal compressibility, respiratory phasicity and response to augmentation. Popliteal Vein: No evidence of thrombus. Normal compressibility, respiratory phasicity and response to augmentation. Calf Veins: No evidence of thrombus. Normal compressibility and flow on color Doppler imaging. Superficial Great Saphenous Vein: No evidence of thrombus. Normal compressibility and flow on color Doppler imaging. Venous Reflux:  None. Other Findings:  None. IMPRESSION: No evidence of deep venous thrombosis in LEFT lower extremity. Electronically Signed   By: Lavonia Dana M.D.   On: 03/05/2016 13:11    Labs:  CBC:  Recent Labs  03/05/16 1137 03/06/16 0622 03/29/16 0900  WBC 26.8* 13.6* 21.5*  HGB 12.4 10.8* 10.5*  HCT 37.1 32.2* 31.6*  PLT 364 297 394    COAGS:  Recent Labs  03/29/16 0900  INR 1.24  APTT 27    BMP:  Recent  Labs  03/05/16 1137 03/06/16 0622  NA 134* 133*  K 3.4* 3.3*  CL 100* 103  CO2 19* 23  GLUCOSE 155* 113*  BUN 10 <5*  CALCIUM 9.3 8.0*  CREATININE 0.65 0.38*  GFRNONAA >60 >60  GFRAA >60 >60    LIVER FUNCTION TESTS:  Recent Labs  03/05/16 1137  BILITOT 0.8  AST 28  ALT 19  ALKPHOS 74  PROT 7.1  ALBUMIN 3.5    TUMOR MARKERS: No results for input(s): AFPTM, CEA, CA199, CHROMGRNA in the last 8760 hours.  Assessment and Plan:  Cough and chest pain since early April Noted LLL mass Now scheduled for bx Risks and Benefits discussed with the patient including, but not limited to bleeding, hemoptysis, respiratory failure requiring intubation, infection, pneumothorax requiring chest tube placement, stroke from air embolism or even death. All of the patient's questions were answered, patient is agreeable to proceed. Consent signed and in chart.   Thank you for this interesting consult.   I greatly enjoyed meeting Christina Mckee and look forward to participating in their care.  A copy of this report was sent to the requesting provider on this date.  Electronically Signed: Devany Aja A 03/29/2016, 9:47 AM   I spent a total of  30 Minutes   in face to face in clinical consultation, greater than 50% of which was counseling/coordinating care for left lung mass bx

## 2016-03-29 NOTE — Discharge Instructions (Signed)

## 2016-03-29 NOTE — Sedation Documentation (Signed)
MD wants pt to lay on L side down for 10 minutes before getting back on stretcher

## 2016-03-29 NOTE — Sedation Documentation (Signed)
Bandaid  Mid Back- L side intact

## 2016-03-29 NOTE — Telephone Encounter (Signed)
Deferred to Dr Ashby Dawes per Dr Mike Gip, patient informed of this

## 2016-03-29 NOTE — Sedation Documentation (Signed)
Pt can lay on back once off CT table

## 2016-03-29 NOTE — Telephone Encounter (Signed)
Had her biopsy today and it went well,  Asking for refill on abx because she is still coughing, has phlegm and abd gas form tumor rubbing on back causing pneumonia. Feels she needs another round of them.

## 2016-03-29 NOTE — Sedation Documentation (Signed)
Patient is resting comfortably. 

## 2016-03-29 NOTE — Procedures (Signed)
Successful CT BX LLL MASS No comp stable Full report in PACS Path pending

## 2016-03-30 ENCOUNTER — Telehealth: Payer: Self-pay | Admitting: *Deleted

## 2016-03-30 NOTE — Telephone Encounter (Signed)
Robitussin with codein, does not get better needs to be seen or go to ER

## 2016-03-30 NOTE — Telephone Encounter (Signed)
DR pt; states her cough has come back and she is producing lots of mucus. Worse first thing in am and then gets worse again in the afternoon. Cough is causing pressure in stomach and chest as before. Pt just had CT guided Lung BX 03/29/16. Pt is asking for any advise or something to help with the cough. Please advise.   Allergies   Penicillins  Hives, Other (See Comments)   03/05/2016  Past Updates...   Has patient had a PCN reaction causing immediate rash, facial/tongue/throat swelling, SOB or lightheadedness with hypotension: No Has patient had a PCN reaction causing severe rash involving mucus membranes or skin necrosis: No Has patient had a PCN reaction that required hospitalization No Has patient had a PCN reaction occurring within the last 10 years: No If all of the above answers are "NO", then may proceed with Cephalosporin use.

## 2016-03-31 NOTE — Telephone Encounter (Signed)
Spoke with pt and informed her of DK response. Pt states she is already using cough syrup and that the mucus is the issues. appt scheduled for pt with DK. Nothing further needed.

## 2016-04-01 ENCOUNTER — Ambulatory Visit: Payer: BLUE CROSS/BLUE SHIELD | Admitting: Internal Medicine

## 2016-04-05 ENCOUNTER — Encounter: Payer: Self-pay | Admitting: Hematology and Oncology

## 2016-04-05 ENCOUNTER — Inpatient Hospital Stay: Payer: BLUE CROSS/BLUE SHIELD | Attending: Hematology and Oncology | Admitting: Hematology and Oncology

## 2016-04-05 ENCOUNTER — Inpatient Hospital Stay: Payer: BLUE CROSS/BLUE SHIELD

## 2016-04-05 VITALS — BP 133/78 | HR 110 | Temp 98.4°F | Resp 18 | Ht 65.0 in | Wt 108.2 lb

## 2016-04-05 DIAGNOSIS — Z79899 Other long term (current) drug therapy: Secondary | ICD-10-CM | POA: Diagnosis not present

## 2016-04-05 DIAGNOSIS — K760 Fatty (change of) liver, not elsewhere classified: Secondary | ICD-10-CM | POA: Diagnosis not present

## 2016-04-05 DIAGNOSIS — R05 Cough: Secondary | ICD-10-CM | POA: Insufficient documentation

## 2016-04-05 DIAGNOSIS — C3432 Malignant neoplasm of lower lobe, left bronchus or lung: Secondary | ICD-10-CM

## 2016-04-05 DIAGNOSIS — F17210 Nicotine dependence, cigarettes, uncomplicated: Secondary | ICD-10-CM | POA: Insufficient documentation

## 2016-04-05 DIAGNOSIS — C3492 Malignant neoplasm of unspecified part of left bronchus or lung: Secondary | ICD-10-CM

## 2016-04-05 DIAGNOSIS — Z86718 Personal history of other venous thrombosis and embolism: Secondary | ICD-10-CM | POA: Insufficient documentation

## 2016-04-05 LAB — COMPREHENSIVE METABOLIC PANEL
ALT: 53 U/L (ref 14–54)
AST: 50 U/L — ABNORMAL HIGH (ref 15–41)
Albumin: 3.1 g/dL — ABNORMAL LOW (ref 3.5–5.0)
Alkaline Phosphatase: 137 U/L — ABNORMAL HIGH (ref 38–126)
Anion gap: 10 (ref 5–15)
BUN: <5 mg/dL — ABNORMAL LOW (ref 6–20)
CO2: 29 mmol/L (ref 22–32)
Calcium: 9.1 mg/dL (ref 8.9–10.3)
Chloride: 95 mmol/L — ABNORMAL LOW (ref 101–111)
Creatinine, Ser: 0.5 mg/dL (ref 0.44–1.00)
GFR calc Af Amer: >60 mL/min (ref >60)
GFR calc non Af Amer: >60 mL/min (ref >60)
Glucose, Bld: 108 mg/dL — ABNORMAL HIGH (ref 65–99)
Potassium: 3.4 mmol/L — ABNORMAL LOW (ref 3.5–5.1)
Sodium: 134 mmol/L — ABNORMAL LOW (ref 135–145)
Total Bilirubin: 0.1 mg/dL — ABNORMAL LOW (ref 0.3–1.2)
Total Protein: 7.2 g/dL (ref 6.5–8.1)

## 2016-04-05 LAB — CBC WITH DIFFERENTIAL/PLATELET
Basophils Absolute: 0.1 10*3/uL (ref 0–0.1)
Basophils Relative: 1 %
Eosinophils Absolute: 0 10*3/uL (ref 0–0.7)
Eosinophils Relative: 0 %
HCT: 32.4 % — ABNORMAL LOW (ref 35.0–47.0)
Hemoglobin: 10.9 g/dL — ABNORMAL LOW (ref 12.0–16.0)
Lymphocytes Relative: 11 %
Lymphs Abs: 2.1 10*3/uL (ref 1.0–3.6)
MCH: 31.3 pg (ref 26.0–34.0)
MCHC: 33.8 g/dL (ref 32.0–36.0)
MCV: 92.7 fL (ref 80.0–100.0)
Monocytes Absolute: 1.3 10*3/uL — ABNORMAL HIGH (ref 0.2–0.9)
Monocytes Relative: 7 %
Neutro Abs: 14.7 10*3/uL — ABNORMAL HIGH (ref 1.4–6.5)
Neutrophils Relative %: 81 %
Platelets: 472 10*3/uL — ABNORMAL HIGH (ref 150–440)
RBC: 3.5 MIL/uL — ABNORMAL LOW (ref 3.80–5.20)
RDW: 14.2 % (ref 11.5–14.5)
WBC: 18.3 10*3/uL — ABNORMAL HIGH (ref 3.6–11.0)

## 2016-04-05 LAB — LACTATE DEHYDROGENASE: LDH: 128 U/L (ref 98–192)

## 2016-04-05 NOTE — Progress Notes (Signed)
Pt reports she is smoking less 6-8 cigarettes a day.  Pt reports fatigue that interferes with daily living.  Pt reports confusion and states its related to her short term memory.  Pt stated her house is a piece of crap and has concerns to about mold and humidity.  Pt stated her cough has since came up and uses cough medicine and cough drops daily now..  Pt stated that she drinks 3-4 beers a day in the evening.  Reports no trouble swallowing however has a hard time getting solids down so pt eats things like cottage cheese and doesn't know if the trouble with solids is because she just don't like them..  Pt reports multiple BM's through the week greater than 10 however really does not eat solid.

## 2016-04-05 NOTE — Progress Notes (Signed)
[facility] day:  04/05/2016  Chief Complaint: Christina Mckee is a 55 y.o. female with a left lower mass who is seen for reassessment after interval CT guided biopsy and hospitalization.  HPI: The patient was admitted to [facility] from 03/05/2016 - 03/06/2016 with a left lower lobe mass and obstructive pneumonia.  She presented with a 3 month history of cough and pressure-like sensation in her abdomen. She was prescribed a Z-Pak. Her symptom temporarily improved. She has had a residual dry scratchy cough. Because of progressive symptoms, she presented to the emergency room.  Because of a history of a DVT in 2014, she underwent chest CT angiogram on 03/05/2016.  There was no evidence of thrombosis. There was an 8.7 x 5.7 x 3.3 cm mass in the medial aspect of the superior segment of the left lower lobe extending into the medial aspect of the left upper lobe and into the left hilum. There was vascular and endobronchial encasement and narrowing. There were multiple small left lung nodules. Abdomen and pelvic CT scan revealed diffuse hepatic steatosis and no evidence of metastatic disease in the abdomen or pelvis.  Bilateral lower extremity duplex on 03/05/2016 revealed no evidence of thrombosis.  She was started on the Levaquin and discharged on Levaquin to complete a 10 day course.   CT guided left lower lobe biopsy on 03/29/2016 revealed moderately differentiated squamous cell carcinoma.  Her history is notable for an extensive left lower extremity thromboses in 12/2012. Because of the extensive clot burden, she underwent TPA with angioplasty on 12/28/2012. An IVC filter was placed on 12/29/2012 and still remains. Hypercoagulable workup was negative for factor V Leiden, prothrombin gene mutation, lupus anticoagulant and antithrombin III. She was on Xarelto for 6 months.  Symptomatically, she is fatigued.  She notes some short term memory issues.  She  denies numbness, weakness, balance or coordination issues.  She continues to have a cough.  Sputum is white/fluffy and clear.  She notes some issues with swallowing solid foods.  She is mostly eating soft things (yogurt, cottage cheese, jello).  She will drink Ensure or Boost or Special-K, but is "lucky if I eat 2/3 of one".   Past Medical History  Diagnosis Date  . DVT (deep venous thrombosis) ([place])     in 2015    Past Surgical History  Procedure Laterality Date  . Cesarean section    . Ivc filter placement ([facility])    . Breast cyst excision      left breast removal    Family History  Problem Relation Age of Onset  . Breast cancer Mother   . Breast cancer Maternal Aunt   . Stomach cancer Paternal Uncle   . Leukemia Paternal Uncle     Social History:  reports that she has been smoking Cigarettes.  She has been smoking about 0.25 packs per day. She has never used smokeless tobacco. She reports that she drinks alcohol. She reports that she does not use illicit drugs.  She has smoked 1/2-1 pack a day since age 79. She has a 30-40 pack year smoking history.  She drinks 4 beers a day. She lives in [place] alone with her cats and dogs. She previously worked at [employer], but notes all positions were recently eliminated. The patient is alone today.  Allergies:  Allergies  Allergen Reactions  . Penicillins Hives and Other (See Comments)    Has patient had a PCN reaction causing immediate rash,  facial/tongue/throat swelling, SOB or lightheadedness with hypotension: No Has patient had a PCN reaction causing severe rash involving mucus membranes or skin necrosis: No Has patient had a PCN reaction that required hospitalization No Has patient had a PCN reaction occurring within the last 10 years: No If all of the above answers are "NO", then may proceed with Cephalosporin use.    Current Medications: Current Outpatient Prescriptions  Medication Sig Dispense Refill  . fluticasone  (FLONASE) 50 MCG/ACT nasal spray Place 1 spray into both nostrils daily as needed for allergies or rhinitis.    Marland Kitchen guaifenesin (ROBITUSSIN) 100 MG/5ML syrup Take 200 mg by mouth 3 (three) times daily as needed for cough or congestion.    . metoprolol tartrate (LOPRESSOR) 25 MG tablet Take 1 tablet (25 mg total) by mouth 2 (two) times daily. 60 tablet 2  . nicotine (NICODERM CQ - DOSED IN MG/24 HOURS) 21 mg/24hr patch Place 1 patch (21 mg total) onto the skin daily. 28 patch 0  . mometasone-formoterol (DULERA) 200-5 MCG/ACT AERO Inhale 2 puffs into the lungs 2 (two) times daily. (Patient not taking: Reported on 04/05/2016) 3 Inhaler 0   No current facility-administered medications for this visit.    Review of Systems:  GENERAL: Fatigue. No fevers, sweats or weight loss. PERFORMANCE STATUS (ECOG): 1 HEENT: No visual changes, runny nose, sore throat, mouth sores or tenderness. Lungs: No shortness of breath. Chronic cough s/p Levaquin and azithromycin. Sputum white/fluffy and clear.  No hemoptysis. Cardiac: No chest pain, palpitations, orthopnea, or PND. GI: Poor oral intake.  Able to eat soft foods.  Food sometimes causes nausea.  No vomiting, diarrhea, constipation, melena or hematochezia. GU: No urgency, frequency, dysuria, or hematuria. Musculoskeletal: No back pain. No joint pain. No muscle tenderness. Extremities: Chronic left lower extremity edema. No pain. Skin: No rashes or skin changes. Neuro: Short term memory issues.  No headache, numbness or weakness, balance or coordination issues. Endocrine: No diabetes, thyroid issues, hot flashes or night sweats. Psych: No mood changes, depression or anxiety. Pain: No focal pain. Review of systems: All other systems reviewed and found to be negative  Physical Exam: Blood pressure 133/78, pulse 110, temperature 98.4 F (36.9 C), temperature source Tympanic, resp. rate 18, height '5\' 5"'$  (1.651 m), weight 108 lb 3.9 oz (49.1  kg), SpO2 95 %. GENERAL: Thin woman sitting comfortably in the exam room in no acute distress. MENTAL STATUS: Alert and oriented to person, place and time. HEAD: Shoulder length auburn hair. Normocephalic, atraumatic, face symmetric, no Cushingoid features. EYES: Blue eyes. Pupils equal round and reactive to light and accomodation. No conjunctivitis or scleral icterus. ENT: Oropharynx clear without lesion. Tongue normal. Mucous membranes moist.  RESPIRATORY: Faint dry crackles left lower lobe. Otherwise, clear to auscultation without rales, wheezes or rhonchi. CARDIOVASCULAR: Regular rate and rhythm without murmur, rub or gallop. ABDOMEN: Soft, non-tender, with active bowel sounds, and no hepatosplenomegaly. No masses. SKIN: Spider angiomas on chest.  No rashes, ulcers or lesions. EXTREMITIES: Slight left lower extremity edema. No skin discoloration or tenderness. No palpable cords. LYMPH NODES: No palpable cervical, supraclavicular, axillary or inguinal adenopathy  NEUROLOGICAL: Alert & oriented, cranial nerves II-XII intact; motor strength 5/5 throughout; sensation intact; finger to nose and RAM normal; able to walk heel to toe; negative Rhomberg; no clonus. PSYCH:  Appropriate.   No visits with results within 3 Day(s) from this visit. Latest known visit with results is:  Hospital Outpatient Visit on 03/29/2016  Component Date Value Ref Range  Status  . aPTT 03/29/2016 27  24 - 37 seconds Final  . WBC 03/29/2016 21.5* 4.0 - 10.5 K/uL Final  . RBC 03/29/2016 3.38* 3.87 - 5.11 MIL/uL Final  . Hemoglobin 03/29/2016 10.5* 12.0 - 15.0 g/dL Final  . HCT 03/29/2016 31.6* 36.0 - 46.0 % Final  . MCV 03/29/2016 93.5  78.0 - 100.0 fL Final  . MCH 03/29/2016 31.1  26.0 - 34.0 pg Final  . MCHC 03/29/2016 33.2  30.0 - 36.0 g/dL Final  . RDW 03/29/2016 13.5  11.5 - 15.5 % Final  . Platelets 03/29/2016 394  150 - 400 K/uL Final  . Prothrombin Time 03/29/2016 15.7* 11.6 - 15.2 seconds  Final  . INR 03/29/2016 1.24  0.00 - 1.49 Final    Assessment:  Christina Mckee is a 55 y.o. female with clinical T3NxMx squamous cell lung cancer.  She has a 30-40 pack year smoking history. She presented with a 3 month history of cough.  Chest CT angiogram on 03/05/2016 revealed 8.7 x 5.7 x 3.3 cm mass in the medial aspect of the superior segment of the left lower lobe extending into the medial aspect of the left upper lobe and into the left hilum. There was vascular and endobronchial encasement and narrowing. There were multiple small left lung nodules. Abdomen and pelvic CT scan revealed diffuse hepatic steatosis and no evidence of metastatic disease in the abdomen or pelvis.  CT guided left lower lobe biopsy on 03/29/2016 revealed moderately differentiated squamous cell carcinoma.  She has a history of an extensive left lower extremity thromboses in 12/2012. Because of the extensive clot burden, she underwent TPA with angioplasty on 12/28/2012. An IVC filter was placed on 12/29/2012 and still remains. Hypercoagulable workup was negative for Factor V Leiden, prothrombin gene mutation, lupus anticoagulant, and antithrombin III.  She was on Xarelto for 6 months.  Symptomatically, she is fatigued.  She has some issues with swallowing.  She has short term memory issues.  Neurologic exam is normal.  Plan: 1.  Review initial scans and recent CT guided biopsy.  Discuss locally advanced disease versus metastatic disease based on multiple small lung nodules.  Discuss PET scan and head imaging (CT versus MRI).  Discuss testing for molecular testing.  Discuss likelihood of concurrent chemotherapy (weekly carboplatin and Taxol) with radiation.  Discuss consult with Dr. Baruch Gouty of radiation oncology. 2.  Labs today:  CBC with diff, CMP, CEA, LDH. 3.  PET scan- initial staging lung cancer. 4.  Head CT with and without contrast. 5.  Consult Dr. Baruch Gouty, radiation oncology. 6.  [place]-  assistance with financial issues. 7.  Discuss caloric intake.  Numerous suggestions made. 8.  RTC after above to finalize treatment plan.  Addendum:  Alkaline phosphatase is elevated.  Consider bone scan if PET is negative.   Lequita Asal, MD  04/05/2016, 4:08 PM

## 2016-04-06 ENCOUNTER — Telehealth: Payer: Self-pay | Admitting: *Deleted

## 2016-04-06 LAB — CEA: CEA: 2.6 ng/mL (ref 0.0–4.7)

## 2016-04-06 NOTE — Telephone Encounter (Signed)
Left voicemail for patient introducing navigation program and giving contact information.

## 2016-04-07 ENCOUNTER — Telehealth: Payer: Self-pay | Admitting: Hematology and Oncology

## 2016-04-07 NOTE — Telephone Encounter (Signed)
Please call regarding molecular testing: [phone]. Ask for Tammy or Dr. Avis Epley. Thank you.

## 2016-04-07 NOTE — Telephone Encounter (Signed)
Dr. Mike Gip called and spoke to Dr. Tillman Sers. To resolve issue

## 2016-04-08 ENCOUNTER — Encounter: Payer: Self-pay | Admitting: Internal Medicine

## 2016-04-08 ENCOUNTER — Ambulatory Visit (INDEPENDENT_AMBULATORY_CARE_PROVIDER_SITE_OTHER): Payer: BLUE CROSS/BLUE SHIELD | Admitting: Internal Medicine

## 2016-04-08 ENCOUNTER — Telehealth: Payer: Self-pay | Admitting: *Deleted

## 2016-04-08 ENCOUNTER — Encounter: Payer: Self-pay | Admitting: *Deleted

## 2016-04-08 VITALS — BP 118/64 | HR 114 | Ht 65.0 in | Wt 108.0 lb

## 2016-04-08 DIAGNOSIS — R918 Other nonspecific abnormal finding of lung field: Secondary | ICD-10-CM

## 2016-04-08 NOTE — Patient Instructions (Addendum)
--  Recommend that you take dextromethorphan (not guaifenesin) for cough.   Will start prescription cough syrup. The major side effects of this medication is drowsiness and constipation. Marland Kitchen

## 2016-04-08 NOTE — Progress Notes (Signed)
* [facility]     Assessment and Plan:  Left lung mass, NSCLC.  --Pt is following with Dr. Mike Gip regarding diagnosis of lung cancer.   Cough.  Likely secondary to bronchitis and presence of cancer. Notes that tessalon has not helped in the past. Recommended that she take dextromethorphan for cough; and we have previously called in script for narcotic cough medicine, this was on back order 2 weeks ago, but per pharmacy was to be available within a few days.  --She was asked to check with the pharmacy and see if it is available yet. If not, she is asked to call us back to call in something else for her.    History of DVT. -Continue DVT prophylaxis. -Given her history of DVT in the left lower extremity, as well as IVC filter placement, will check a lower extremity Doppler. If positive, the patient will likely need to be continued on anticoagulation indefinitely. Given her history of previous clot, and her current possible cancer. She would be at higher risk of venous thromboembolic disease.  Pt left the office before she could be given her patient instructions today.   Date: 04/08/2016  MRN# [id number] Christina Mckee 1961/09/26   Christina Mckee is a 55 y.o. old female seen in follow up for chief complaint of  Chief Complaint  Patient presents with  . Follow-up    pt states breathing is baseline. cont prod cough clear to white in color, occ wheezing w/cough, occ sob & chest pressure. had CT biopsy on 5/8     HPI:    She is very frustrated and annoyed today. She is angry that her biomarkers were not ordered at the time of the biopsy. I explained that this is performed after a sample tests positive for cancer.   Medication:   Outpatient Encounter Prescriptions as of 04/08/2016  Medication Sig  . fluticasone (FLONASE) 50 MCG/ACT nasal spray Place 1 spray into both nostrils daily as needed for allergies or rhinitis.  Marland Kitchen guaifenesin (ROBITUSSIN) 100 MG/5ML syrup  Take 200 mg by mouth 3 (three) times daily as needed for cough or congestion.  . metoprolol tartrate (LOPRESSOR) 25 MG tablet Take 1 tablet (25 mg total) by mouth 2 (two) times daily.  . mometasone-formoterol (DULERA) 200-5 MCG/ACT AERO Inhale 2 puffs into the lungs 2 (two) times daily. (Patient not taking: Reported on 04/05/2016)  . nicotine (NICODERM CQ - DOSED IN MG/24 HOURS) 21 mg/24hr patch Place 1 patch (21 mg total) onto the skin daily.   No facility-administered encounter medications on file as of 04/08/2016.     Allergies:  Penicillins  Review of Systems: Gen:  Denies  fever, sweats. HEENT: Denies blurred vision. Cvc:  No dizziness, chest pain or heaviness Resp:   Denies cough or sputum porduction. Gi: Denies swallowing difficulty, stomach pain. constipation, bowel incontinence Gu:  Denies bladder incontinence, burning urine Ext:   No Joint pain, stiffness. Skin: No skin rash, easy bruising. Endoc:  No polyuria, polydipsia. Psych: No depression, insomnia. Other:  All other systems were reviewed and found to be negative other than what is mentioned in the HPI.   Physical Examination:   VS: BP 118/64 mmHg  Pulse 114  Ht '5\' 5"'$  (1.651 m)  Wt 108 lb (48.988 kg)  BMI 17.97 kg/m2  SpO2 96%  General Appearance: No distress  Neuro:without focal findings,  speech normal,  HEENT: PERRLA, EOM intact. Pulmonary: normal breath sounds, No wheezing.   CardiovascularNormal S1,S2.  No m/r/g.   Abdomen: Benign, Soft, non-tender. Renal:  No costovertebral tenderness  GU:  Not performed at this time. Endoc: No evident thyromegaly, no signs of acromegaly. Skin:   warm, no rash. Extremities: normal, no cyanosis, clubbing.   LABORATORY PANEL:   CBC  Recent Labs Lab 04/05/16 1651  WBC 18.3*  HGB 10.9*  HCT 32.4*  PLT 472*   ------------------------------------------------------------------------------------------------------------------  Chemistries   Recent Labs Lab  04/05/16 1651  NA 134*  K 3.4*  CL 95*  CO2 29  GLUCOSE 108*  BUN <5*  CREATININE 0.50  CALCIUM 9.1  AST 50*  ALT 53  ALKPHOS 137*  BILITOT 0.1*   ------------------------------------------------------------------------------------------------------------------  Cardiac Enzymes No results for input(s): TROPONINI in the last 168 hours. ------------------------------------------------------------  RADIOLOGY:   No results found for this or any previous visit. Results for orders placed in visit on 12/20/12  DG Chest 2 View   Narrative * PRIOR REPORT IMPORTED FROM AN EXTERNAL SYSTEM*   PRIOR REPORT IMPORTED FROM THE [employer] WORKFLOW SYSTEM   REASON FOR EXAM:    dvt left leg  COMMENTS:   PROCEDURE:     DXR - DXR CHEST PA (OR AP) AND LATERAL  - Dec 20 2012   9:16PM   RESULT:     The lungs are hyperinflated. There is no focal infiltrate.  There  is no pneumothorax or pneumomediastinum. The cardiac silhouette is normal  in  size. The mediastinum is normal in width.   IMPRESSION:      There is hyperinflation consistent with COPD. There is no  evidence of CHF nor pneumonia. There are no findings to suggest a  pulmonary  infarction.   Dictation Site: 5       ------------------------------------------------------------------------------------------------------------------  Thank  you for allowing [facility] to assist in the care of your patient. Our recommendations are noted above.  Please contact us if we can be of further service.   Marda Stalker, MD.  [facility] Office Number: [phone]  Patricia Pesa, M.D.  Vilinda Boehringer, M.D.  Merton Border, M.D  04/08/2016

## 2016-04-08 NOTE — Telephone Encounter (Addendum)
Patient called on Tuesday 04/06/16 to set up consultation appointment.  Pt said she needed to check with her work schedule and would call back to set that appointment. Awaiting return call.

## 2016-04-08 NOTE — Progress Notes (Signed)
Oncology Nurse Navigator Documentation  Oncology Nurse Navigator Flowsheets 04/08/2016  Navigator Encounter Type Other  Treatment Phase Other  Barriers/Navigation Needs Coordination of Care  Interventions Coordination of Care  Coordination of Care Other  Acuity Level 2  Time Spent with Patient 30   Per Cancer Conference discussion this am, it was recommended testing for PDL 1. I emailed Dr. Rogue Bussing today and updated.

## 2016-04-09 ENCOUNTER — Encounter: Payer: Self-pay | Admitting: *Deleted

## 2016-04-09 DIAGNOSIS — Z780 Asymptomatic menopausal state: Secondary | ICD-10-CM | POA: Insufficient documentation

## 2016-04-11 ENCOUNTER — Encounter: Payer: Self-pay | Admitting: Hematology and Oncology

## 2016-04-11 ENCOUNTER — Other Ambulatory Visit: Payer: Self-pay | Admitting: Hematology and Oncology

## 2016-04-11 DIAGNOSIS — C3492 Malignant neoplasm of unspecified part of left bronchus or lung: Secondary | ICD-10-CM

## 2016-04-12 ENCOUNTER — Encounter (HOSPITAL_COMMUNITY): Payer: Self-pay

## 2016-04-12 ENCOUNTER — Encounter
Admission: RE | Admit: 2016-04-12 | Discharge: 2016-04-12 | Disposition: A | Discharge status: Home / Self Care | Payer: BLUE CROSS/BLUE SHIELD | Source: Ambulatory Visit | Attending: Hematology and Oncology | Admitting: Hematology and Oncology

## 2016-04-12 DIAGNOSIS — C3492 Malignant neoplasm of unspecified part of left bronchus or lung: Secondary | ICD-10-CM

## 2016-04-14 ENCOUNTER — Encounter (HOSPITAL_COMMUNITY): Payer: Self-pay

## 2016-04-14 ENCOUNTER — Encounter
Admission: RE | Admit: 2016-04-14 | Discharge: 2016-04-14 | Disposition: A | Discharge status: Home / Self Care | Payer: BLUE CROSS/BLUE SHIELD | Source: Ambulatory Visit | Attending: Hematology and Oncology | Admitting: Hematology and Oncology

## 2016-04-14 DIAGNOSIS — C787 Secondary malignant neoplasm of liver and intrahepatic bile duct: Secondary | ICD-10-CM | POA: Insufficient documentation

## 2016-04-14 DIAGNOSIS — C3492 Malignant neoplasm of unspecified part of left bronchus or lung: Secondary | ICD-10-CM | POA: Diagnosis present

## 2016-04-14 LAB — GLUCOSE, CAPILLARY: Glucose-Capillary: 145 mg/dL — ABNORMAL HIGH (ref 65–99)

## 2016-04-14 MED ORDER — FLUDEOXYGLUCOSE F - 18 (FDG) INJECTION
12.0900 | Freq: Once | INTRAVENOUS | Status: AC | PRN
  Administered 2016-04-14: 12.09 via INTRAVENOUS

## 2016-04-15 ENCOUNTER — Encounter (HOSPITAL_COMMUNITY): Payer: Self-pay

## 2016-04-15 ENCOUNTER — Ambulatory Visit: Payer: BLUE CROSS/BLUE SHIELD | Admitting: Radiation Oncology

## 2016-04-15 ENCOUNTER — Ambulatory Visit
Admission: RE | Admit: 2016-04-15 | Discharge: 2016-04-15 | Disposition: A | Discharge status: Home / Self Care | Payer: BLUE CROSS/BLUE SHIELD | Source: Ambulatory Visit | Attending: Hematology and Oncology | Admitting: Hematology and Oncology

## 2016-04-15 DIAGNOSIS — C3492 Malignant neoplasm of unspecified part of left bronchus or lung: Secondary | ICD-10-CM

## 2016-04-15 MED ORDER — IOPAMIDOL (ISOVUE-300) INJECTION 61%
75.0000 mL | Freq: Once | INTRAVENOUS | Status: AC | PRN
  Administered 2016-04-15: 75 mL via INTRAVENOUS

## 2016-04-16 ENCOUNTER — Other Ambulatory Visit: Payer: Self-pay | Admitting: *Deleted

## 2016-04-16 MED ORDER — NICOTINE 21 MG/24HR TD PT24: 21.0000 mg | MEDICATED_PATCH | Freq: Every day | TRANSDERMAL | Status: AC

## 2016-04-16 NOTE — Telephone Encounter (Signed)
Requesting lab results and refill on nicotine patches. Do you want to leave her at 21 mg / hr dose or reduce dose? Last fill was 4/15

## 2016-04-20 ENCOUNTER — Telehealth: Payer: Self-pay | Admitting: *Deleted

## 2016-04-20 NOTE — Telephone Encounter (Signed)
I had tried calling pt 3 times this week. No call back from any calls until today (friday).  She left me a message that she had went to printer to get a paper and I would call her when she stepped away from her desk and said Tag,it was my turn to call her back.  I returned her call to go over the elevated alk phos. Levels and the request to have whole body bone scan to get a better pictures of bones to check out any cancer and she refused.  I then asked her about did she reschedule her appt for dr Baruch Gouty and she said no she did not need to see him. She already has a doctor and I tried to explain that he was the radiation doctor that Dr. Mike Gip wanted her to see for radiation with treatment and he was the only doctor that can give radiation and that Dr. Mike Gip would give her chemo treatment and she wants to get started with treatment and get it over with and wanted to know if her special tests had came back yet and with review all of them had came back and it was scanned in 5/25.  I told her that I need to speak to [place] and let her know results and then get her an appt to see someone next week.  Se was agreeable to plan. Also pt mentioned pain from coughing so much and she had been taking tylenol and no help.  When I spoke to [facility] the patient will not qualify with specialty testing for [facility] or opdivo.  And as far as the pain she could try ibuprofen until she is seen next week.  Called pt back and let her know that based on testing she would not qualify for the other meds that she spoke to her about if her testing showed positive for.  Also that I would need to speak to Dr B and see if he can see her next week.  I advised her that she could try ibuprofen and she had already tried that in the past and she would need to wait til she saw md next week and she was agreeable. I will call her tues to let her know appt time and date.

## 2016-04-21 ENCOUNTER — Ambulatory Visit: Payer: BLUE CROSS/BLUE SHIELD | Admitting: Internal Medicine

## 2016-04-21 ENCOUNTER — Other Ambulatory Visit: Payer: Self-pay | Admitting: Hematology and Oncology

## 2016-04-21 ENCOUNTER — Ambulatory Visit: Payer: BLUE CROSS/BLUE SHIELD

## 2016-04-21 DIAGNOSIS — C3492 Malignant neoplasm of unspecified part of left bronchus or lung: Secondary | ICD-10-CM

## 2016-04-21 MED ORDER — ONDANSETRON HCL 8 MG PO TABS: 8.0000 mg | ORAL_TABLET | Freq: Two times a day (BID) | ORAL | Status: DC | PRN

## 2016-04-22 ENCOUNTER — Inpatient Hospital Stay: Payer: BLUE CROSS/BLUE SHIELD

## 2016-04-22 ENCOUNTER — Inpatient Hospital Stay: Payer: BLUE CROSS/BLUE SHIELD | Attending: Hematology and Oncology | Admitting: Internal Medicine

## 2016-04-22 VITALS — BP 119/72 | HR 112 | Temp 98.5°F | Resp 20 | Ht 65.0 in | Wt 104.9 lb

## 2016-04-22 DIAGNOSIS — R21 Rash and other nonspecific skin eruption: Secondary | ICD-10-CM | POA: Diagnosis not present

## 2016-04-22 DIAGNOSIS — R600 Localized edema: Secondary | ICD-10-CM | POA: Insufficient documentation

## 2016-04-22 DIAGNOSIS — D649 Anemia, unspecified: Secondary | ICD-10-CM | POA: Insufficient documentation

## 2016-04-22 DIAGNOSIS — C3432 Malignant neoplasm of lower lobe, left bronchus or lung: Secondary | ICD-10-CM | POA: Diagnosis not present

## 2016-04-22 DIAGNOSIS — F17210 Nicotine dependence, cigarettes, uncomplicated: Secondary | ICD-10-CM | POA: Diagnosis not present

## 2016-04-22 DIAGNOSIS — C3492 Malignant neoplasm of unspecified part of left bronchus or lung: Secondary | ICD-10-CM

## 2016-04-22 DIAGNOSIS — R112 Nausea with vomiting, unspecified: Secondary | ICD-10-CM

## 2016-04-22 DIAGNOSIS — K760 Fatty (change of) liver, not elsewhere classified: Secondary | ICD-10-CM | POA: Diagnosis not present

## 2016-04-22 DIAGNOSIS — Z86718 Personal history of other venous thrombosis and embolism: Secondary | ICD-10-CM | POA: Diagnosis not present

## 2016-04-22 DIAGNOSIS — T451X5A Adverse effect of antineoplastic and immunosuppressive drugs, initial encounter: Secondary | ICD-10-CM

## 2016-04-22 DIAGNOSIS — C787 Secondary malignant neoplasm of liver and intrahepatic bile duct: Secondary | ICD-10-CM | POA: Diagnosis not present

## 2016-04-22 DIAGNOSIS — E876 Hypokalemia: Secondary | ICD-10-CM | POA: Diagnosis not present

## 2016-04-22 DIAGNOSIS — Z79899 Other long term (current) drug therapy: Secondary | ICD-10-CM | POA: Diagnosis not present

## 2016-04-22 DIAGNOSIS — Z95828 Presence of other vascular implants and grafts: Secondary | ICD-10-CM

## 2016-04-22 DIAGNOSIS — Z5111 Encounter for antineoplastic chemotherapy: Secondary | ICD-10-CM | POA: Insufficient documentation

## 2016-04-22 MED ORDER — LIDOCAINE-PRILOCAINE 2.5-2.5 % EX CREA: 1.0000 "application " | TOPICAL_CREAM | CUTANEOUS | Status: AC | PRN

## 2016-04-22 MED ORDER — ONDANSETRON HCL 8 MG PO TABS
8.0000 mg | ORAL_TABLET | Freq: Three times a day (TID) | ORAL | Status: DC | PRN
Start: 2016-04-22 — End: 2016-04-29

## 2016-04-22 MED ORDER — PROCHLORPERAZINE MALEATE 10 MG PO TABS: 10.0000 mg | ORAL_TABLET | Freq: Four times a day (QID) | ORAL | Status: AC | PRN

## 2016-04-22 NOTE — Patient Instructions (Addendum)
Implanted Port Home Guide An implanted port is a type of central line that is placed under the skin. Central lines are used to provide IV access when treatment or nutrition needs to be given through a person's veins. Implanted ports are used for long-term IV access. An implanted port may be placed because:   You need IV medicine that would be irritating to the small veins in your hands or arms.   You need long-term IV medicines, such as antibiotics.   You need IV nutrition for a long period.   You need frequent blood draws for lab tests.   You need dialysis.  Implanted ports are usually placed in the chest area, but they can also be placed in the upper arm, the abdomen, or the leg. An implanted port has two main parts:   Reservoir. The reservoir is round and will appear as a small, raised area under your skin. The reservoir is the part where a needle is inserted to give medicines or draw blood.   Catheter. The catheter is a thin, flexible tube that extends from the reservoir. The catheter is placed into a large vein. Medicine that is inserted into the reservoir goes into the catheter and then into the vein.  HOW WILL I CARE FOR MY INCISION SITE? Do not get the incision site wet. Bathe or shower as directed by your health care provider.  HOW IS MY PORT ACCESSED? Special steps must be taken to access the port:   Before the port is accessed, a numbing cream can be placed on the skin. This helps numb the skin over the port site.   Your health care provider uses a sterile technique to access the port.  Your health care provider must put on a mask and sterile gloves.  The skin over your port is cleaned carefully with an antiseptic and allowed to dry.  The port is gently pinched between sterile gloves, and a needle is inserted into the port.  Only "non-coring" port needles should be used to access the port. Once the port is accessed, a blood return should be checked. This helps  ensure that the port is in the vein and is not clogged.   If your port needs to remain accessed for a constant infusion, a clear (transparent) bandage will be placed over the needle site. The bandage and needle will need to be changed every week, or as directed by your health care provider.   Keep the bandage covering the needle clean and dry. Do not get it wet. Follow your health care provider's instructions on how to take a shower or bath while the port is accessed.   If your port does not need to stay accessed, no bandage is needed over the port.  WHAT IS FLUSHING? Flushing helps keep the port from getting clogged. Follow your health care provider's instructions on how and when to flush the port. Ports are usually flushed with saline solution or a medicine called heparin. The need for flushing will depend on how the port is used.   If the port is used for intermittent medicines or blood draws, the port will need to be flushed:   After medicines have been given.   After blood has been drawn.   As part of routine maintenance.   If a constant infusion is running, the port may not need to be flushed.  HOW LONG WILL MY PORT STAY IMPLANTED? The port can stay in for as long as your health care   provider thinks it is needed. When it is time for the port to come out, surgery will be done to remove it. The procedure is similar to the one performed when the port was put in.  WHEN SHOULD I SEEK IMMEDIATE MEDICAL CARE? When you have an implanted port, you should seek immediate medical care if:   You notice a bad smell coming from the incision site.   You have swelling, redness, or drainage at the incision site.   You have more swelling or pain at the port site or the surrounding area.   You have a fever that is not controlled with medicine.   This information is not intended to replace advice given to you by your health care provider. Make sure you discuss any questions you have with  your health care provider.   Document Released: 11/08/2005 Document Revised: 08/29/2013 Document Reviewed: 07/16/2013 Elsevier Interactive Patient Education 2016 [place].      Carboplatin injection What is this medicine? CARBOPLATIN (KAR boe pla tin) is a chemotherapy drug. It targets fast dividing cells, like cancer cells, and causes these cells to die. This medicine is used to treat ovarian cancer and many other cancers. This medicine may be used for other purposes; ask your health care provider or pharmacist if you have questions. What should I tell my health care provider before I take this medicine? They need to know if you have any of these conditions: -blood disorders -hearing problems -kidney disease -recent or ongoing radiation therapy -an unusual or allergic reaction to carboplatin, cisplatin, other chemotherapy, other medicines, foods, dyes, or preservatives -pregnant or trying to get pregnant -breast-feeding How should I use this medicine? This drug is usually given as an infusion into a vein. It is administered in a hospital or clinic by a specially trained health care professional. Talk to your pediatrician regarding the use of this medicine in children. Special care may be needed. Overdosage: If you think you have taken too much of this medicine contact a poison control center or emergency room at once. NOTE: This medicine is only for you. Do not share this medicine with others. What if I miss a dose? It is important not to miss a dose. Call your doctor or health care professional if you are unable to keep an appointment. What may interact with this medicine? -medicines for seizures -medicines to increase blood counts like filgrastim, pegfilgrastim, sargramostim -some antibiotics like amikacin, gentamicin, neomycin, streptomycin, tobramycin -vaccines Talk to your doctor or health care professional before taking any of these  medicines: -acetaminophen -aspirin -ibuprofen -ketoprofen -naproxen This list may not describe all possible interactions. Give your health care provider a list of all the medicines, herbs, non-prescription drugs, or dietary supplements you use. Also tell them if you smoke, drink alcohol, or use illegal drugs. Some items may interact with your medicine. What should I watch for while using this medicine? Your condition will be monitored carefully while you are receiving this medicine. You will need important blood work done while you are taking this medicine. This drug may make you feel generally unwell. This is not uncommon, as chemotherapy can affect healthy cells as well as cancer cells. Report any side effects. Continue your course of treatment even though you feel ill unless your doctor tells you to stop. In some cases, you may be given additional medicines to help with side effects. Follow all directions for their use. Call your doctor or health care professional for advice if you get a  fever, chills or sore throat, or other symptoms of a cold or flu. Do not treat yourself. This drug decreases your body's ability to fight infections. Try to avoid being around people who are sick. This medicine may increase your risk to bruise or bleed. Call your doctor or health care professional if you notice any unusual bleeding. Be careful brushing and flossing your teeth or using a toothpick because you may get an infection or bleed more easily. If you have any dental work done, tell your dentist you are receiving this medicine. Avoid taking products that contain aspirin, acetaminophen, ibuprofen, naproxen, or ketoprofen unless instructed by your doctor. These medicines may hide a fever. Do not become pregnant while taking this medicine. Women should inform their doctor if they wish to become pregnant or think they might be pregnant. There is a potential for serious side effects to an unborn child. Talk to  your health care professional or pharmacist for more information. Do not breast-feed an infant while taking this medicine. What side effects may I notice from receiving this medicine? Side effects that you should report to your doctor or health care professional as soon as possible: -allergic reactions like skin rash, itching or hives, swelling of the face, lips, or tongue -signs of infection - fever or chills, cough, sore throat, pain or difficulty passing urine -signs of decreased platelets or bleeding - bruising, pinpoint red spots on the skin, black, tarry stools, nosebleeds -signs of decreased red blood cells - unusually weak or tired, fainting spells, lightheadedness -breathing problems -changes in hearing -changes in vision -chest pain -high blood pressure -low blood counts - This drug may decrease the number of white blood cells, red blood cells and platelets. You may be at increased risk for infections and bleeding. -nausea and vomiting -pain, swelling, redness or irritation at the injection site -pain, tingling, numbness in the hands or feet -problems with balance, talking, walking -trouble passing urine or change in the amount of urine Side effects that usually do not require medical attention (report to your doctor or health care professional if they continue or are bothersome): -hair loss -loss of appetite -metallic taste in the mouth or changes in taste This list may not describe all possible side effects. Call your doctor for medical advice about side effects. You may report side effects to FDA at 1-800-FDA-1088. Where should I keep my medicine? This drug is given in a hospital or clinic and will not be stored at home. NOTE: This sheet is a summary. It may not cover all possible information. If you have questions about this medicine, talk to your doctor, pharmacist, or health care provider.    2016, Elsevier/[employer]. (2008-02-13 14:38:05)        Nanoparticle  Albumin-Bound Paclitaxel injection What is this medicine? NANOPARTICLE ALBUMIN-BOUND PACLITAXEL (Na no PAHR ti kuhl al BYOO muhn-bound PAK li TAX el) is a chemotherapy drug. It targets fast dividing cells, like cancer cells, and causes these cells to die. This medicine is used to treat advanced breast cancer and advanced lung cancer. This medicine may be used for other purposes; ask your health care provider or pharmacist if you have questions. What should I tell my health care provider before I take this medicine? They need to know if you have any of these conditions: -kidney disease -liver disease -low blood counts, like low platelets, red blood cells, or white blood cells -recent or ongoing radiation therapy -an unusual or allergic reaction to paclitaxel, albumin, other chemotherapy,  other medicines, foods, dyes, or preservatives -pregnant or trying to get pregnant -breast-feeding How should I use this medicine? This drug is given as an infusion into a vein. It is administered in a hospital or clinic by a specially trained health care professional. Talk to your pediatrician regarding the use of this medicine in children. Special care may be needed. Overdosage: If you think you have taken too much of this medicine contact a poison control center or emergency room at once. NOTE: This medicine is only for you. Do not share this medicine with others. What if I miss a dose? It is important not to miss your dose. Call your doctor or health care professional if you are unable to keep an appointment. What may interact with this medicine? -cyclosporine -diazepam -ketoconazole -medicines to increase blood counts like filgrastim, pegfilgrastim, sargramostim -other chemotherapy drugs like cisplatin, doxorubicin, epirubicin, etoposide, teniposide, vincristine -quinidine -testosterone -vaccines -verapamil Talk to your doctor or health care professional before taking any of these  medicines: -acetaminophen -aspirin -ibuprofen -ketoprofen -naproxen This list may not describe all possible interactions. Give your health care provider a list of all the medicines, herbs, non-prescription drugs, or dietary supplements you use. Also tell them if you smoke, drink alcohol, or use illegal drugs. Some items may interact with your medicine. What should I watch for while using this medicine? Your condition will be monitored carefully while you are receiving this medicine. You will need important blood work done while you are taking this medicine. This drug may make you feel generally unwell. This is not uncommon, as chemotherapy can affect healthy cells as well as cancer cells. Report any side effects. Continue your course of treatment even though you feel ill unless your doctor tells you to stop. In some cases, you may be given additional medicines to help with side effects. Follow all directions for their use. Call your doctor or health care professional for advice if you get a fever, chills or sore throat, or other symptoms of a cold or flu. Do not treat yourself. This drug decreases your body's ability to fight infections. Try to avoid being around people who are sick. This medicine may increase your risk to bruise or bleed. Call your doctor or health care professional if you notice any unusual bleeding. Be careful brushing and flossing your teeth or using a toothpick because you may get an infection or bleed more easily. If you have any dental work done, tell your dentist you are receiving this medicine. Avoid taking products that contain aspirin, acetaminophen, ibuprofen, naproxen, or ketoprofen unless instructed by your doctor. These medicines may hide a fever. Do not become pregnant while taking this medicine. Women should inform their doctor if they wish to become pregnant or think they might be pregnant. There is a potential for serious side effects to an unborn child. Talk to  your health care professional or pharmacist for more information. Do not breast-feed an infant while taking this medicine. Men are advised not to father a child while receiving this medicine. What side effects may I notice from receiving this medicine? Side effects that you should report to your doctor or health care professional as soon as possible: -allergic reactions like skin rash, itching or hives, swelling of the face, lips, or tongue -low blood counts - This drug may decrease the number of white blood cells, red blood cells and platelets. You may be at increased risk for infections and bleeding. -signs of infection - fever or chills, cough,  sore throat, pain or difficulty passing urine -signs of decreased platelets or bleeding - bruising, pinpoint red spots on the skin, black, tarry stools, nosebleeds -signs of decreased red blood cells - unusually weak or tired, fainting spells, lightheadedness -breathing problems -changes in vision -chest pain -high or low blood pressure -mouth sores -nausea and vomiting -pain, swelling, redness or irritation at the injection site -pain, tingling, numbness in the hands or feet -slow or irregular heartbeat -swelling of the ankle, feet, hands Side effects that usually do not require medical attention (report to your doctor or health care professional if they continue or are bothersome): -aches, pains -changes in the color of fingernails -diarrhea -hair loss -loss of appetite This list may not describe all possible side effects. Call your doctor for medical advice about side effects. You may report side effects to FDA at 1-800-FDA-1088. Where should I keep my medicine? This drug is given in a hospital or clinic and will not be stored at home. NOTE: This sheet is a summary. It may not cover all possible information. If you have questions about this medicine, talk to your doctor, pharmacist, or health care provider.    2016, Elsevier/Gold Standard.  (2013-01-01 16:48:50)        Chemotherapy Chemotherapy is the use of medicines to stop or slow the growth of cancer cells. Depending on the type and stage of your cancer, you may have chemotherapy to:  Cure your cancer.  Slow the progression of your cancer.  Ease your cancer symptoms.  Improve the benefits of radiation treatment.  Shrink a tumor before surgery.  Rid the body of cancer cells that remain after a tumor is surgically removed. HOW IS CHEMOTHERAPY GIVEN? Chemotherapy may be given:  By mouth in liquid or pill form.  Through a thin tube that is inserted into a vein or artery.  By getting a shot.  By rubbing a cream or ointment on your skin.  Through liquids that are placed directly into various areas of the body, such as the abdomen, chest, or bladder. HOW OFTEN IS CHEMOTHERAPY GIVEN? Chemotherapy may be given continuously over time, or it may be given in cycles. For example, you may take the medicine for one week out of every month. FOR HOW LONG WILL I NEED CHEMOTHERAPY TREATMENTS? The length of treatment depends on many factors, including:  The type of cancer.  Whether the cancer has spread.  How you respond to the chemotherapy.  Whether you develop side effects. Some types of chemotherapy medicine are given only one time. Others are given for months, years, or for life. WHAT SAFETY PRECAUTIONS MUST I TAKE WHILE ON CHEMOTHERAPY? Chemotherapy medicines are very strong. They will be in all of your bodily fluids, including your urine, stool, saliva, sweat, tears, vaginal secretions, and semen. You must carefully follow some safety precautions to prevent harm to others while you are using these medicines. Here are some recommended precautions:  Make sure that people who help care for you wear disposable gloves if they are going to come into contact with any of your bodily fluids. Women who are pregnant or breastfeeding should not handle any of your bodily  fluids.  Wash any clothes, towels, and linens that may have your bodily fluids on them twice in a washing machine using very hot water.  Dispose of adult diapers, tampons, and sanitary napkins by first sealing them in a plastic bag.  Use a condom when having sex for at least 2 weeks after receiving your  chemotherapy.  Do not share beverages or food.  Keep your chemotherapy medicines in their original bottles. Keep them in a high, safe location, away from children. Do not expose them to heat or moisture. Do not put them in containers with other types of medicines.  Dispose of all wrappers for your chemotherapy medicines by sealing them in a separate plastic bag.  Do not throw away extra medicine, and do not flush it down the toilet. Take medicine that you are not going to use to your health care provider's office where it can be disposed of properly.  Follow your health care provider's directions for the proper disposal of needles, IV tubing, and other medical supplies that have come into contact with your chemotherapy medicines.  If you are issued a hazardous waste container, make sure you understand the directions for using it.  Wash your hands thoroughly with warm water and soap after using the bathroom. Dry your hands with disposable paper towels.  When using the toilet:  Flush it twice after each use, including after vomiting.  Close the lid of the toilet prior to flushing. This helps to avoid splashing.  Both men and women should sit to use the toilet. This helps avoid splashing. WHAT ARE THE SIDE EFFECTS OF CHEMOTHERAPY? Side effects depend on a variety of factors, including:  The specific type of chemotherapy medicine used.  The dosage.  How long the medicine is used for.  Your overall health. Some of the side effects you may experience include:  Fatigue and decreased energy.  Decreased appetite.  Changes in your sense of smell or  taste.  Nausea.  Vomiting.  Constipation or diarrhea.  Hair loss.  Increased susceptibility to infection.  Easy bleeding.  Mouth sores.  Burning or tingling in the hands or feet.  Memory problems.   This information is not intended to replace advice given to you by your health care provider. Make sure you discuss any questions you have with your health care provider.   Document Released: 09/05/2007 Document Revised: 11/29/2014 Document Reviewed: 04/16/2014 Elsevier Interactive Patient Education [employer].

## 2016-04-22 NOTE — Progress Notes (Signed)
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  female  Patient of Dr.Corcoran-  With recently diagnosed squamous cell lung cancer is here to review the recent imaging and also the treatment plan.   Patient complains of  Vague  mild pain in the  Left  Chest wall the midaxillary line. 3 on a scale of 10. She complains of poor appetite. Possible weight loss. Chronic nausea no vomiting. Denies any tingling or numbness.  Denies any headaches.  Mild shortness of breath mild chronic cough or hemoptysis.  ROS: A complete 10 point review of system is done which is negative except mentioned above in history of present illness  MEDICAL HISTORY:  Past Medical History  Diagnosis Date  . DVT (deep venous thrombosis) ([place])     in 2015  . Menopause     prior to 2014    SURGICAL HISTORY: Past Surgical History  Procedure Laterality Date  . Cesarean section    . Ivc filter placement ([facility] hx)    . Breast cyst excision      left breast removal    SOCIAL HISTORY: Social History   Social History  . Marital Status: Divorced    Spouse Name: N/A  . Number of Children: N/A  . Years of Education: N/A   Occupational History  . Not on file.   Social History Main Topics  . Smoking status: Current Every Day Smoker -- 0.25 packs/day    Types: Cigarettes  . Smokeless tobacco: Never Used     Comment: 1/2 PPD to 1PPD  . Alcohol Use: 0.0 oz/week    0 Standard drinks or equivalent per week     Comment: daily about 3-4 beers/day  . Drug Use: No  . Sexual Activity: Not on file   Other Topics Concern  . Not on file    Social History Narrative   Lives by herself with her pets. Works as a Theme park manager    FAMILY HISTORY: Family History  Problem Relation Age of Onset  . Breast cancer Mother   . Breast cancer Maternal Aunt   . Stomach cancer Paternal Uncle   . Leukemia Paternal Uncle     ALLERGIES:  is allergic to penicillins.  MEDICATIONS:  Current Outpatient Prescriptions  Medication Sig Dispense Refill  . fluticasone (FLONASE) 50 MCG/ACT nasal spray Place 1 spray into both nostrils daily as needed for allergies or rhinitis.    Marland Kitchen guaifenesin (ROBITUSSIN) 100 MG/5ML syrup Take 200 mg by mouth 3 (three) times daily as needed for cough or congestion.    . metoprolol tartrate (LOPRESSOR) 25 MG tablet Take 1 tablet (25 mg total) by mouth 2 (two) times daily. 60 tablet 2  . nicotine (NICODERM CQ - DOSED IN MG/24 HOURS) 21 mg/24hr patch Place 1 patch (21 mg total) onto the skin daily. 28 patch 0  . ondansetron (ZOFRAN) 8 MG tablet Take 1 tablet (8 mg total) by mouth 2 (two) times daily as needed for refractory nausea / vomiting. Start on day 3 after carboplatin chemo. 30 tablet 1   No current facility-administered medications for this visit.      Marland Kitchen  PHYSICAL EXAMINATION: ECOG PERFORMANCE STATUS: 1 - Symptomatic but completely ambulatory  Filed Vitals:   04/22/16 1346  BP: 119/72  Pulse: 112  Temp: 98.5 F (36.9 C)   Filed Weights   04/22/16 1346  Weight: 104 lb 15 oz (47.6 kg)    GENERAL:  Thin built moderately nourished; Alert, no distress and comfortable.  Alone.  EYES: no pallor or icterus OROPHARYNX: no thrush or ulceration; good dentition  NECK: supple, no masses felt LYMPH:  no palpable lymphadenopathy in the cervical, axillary or inguinal regions LUNGS: clear to auscultation and  No wheeze or crackles HEART/CVS: regular rate & rhythm and no murmurs; No lower extremity edema ABDOMEN: abdomen soft, non-tender and normal bowel sounds Musculoskeletal:no cyanosis of digits  and no clubbing  PSYCH: alert & oriented x 3 with fluent speech NEURO: no focal motor/sensory deficits SKIN:  no rashes or significant lesions  LABORATORY DATA:  I have reviewed the data as listed Lab Results  Component Value Date   WBC 18.3* 04/05/2016   HGB 10.9* 04/05/2016   HCT 32.4* 04/05/2016   MCV 92.7 04/05/2016   PLT 472* 04/05/2016    Recent Labs  03/05/16 1137 03/06/16 0622 04/05/16 1651  NA 134* 133* 134*  K 3.4* 3.3* 3.4*  CL 100* 103 95*  CO2 19* 23 29  GLUCOSE 155* 113* 108*  BUN 10 <5* <5*  CREATININE 0.65 0.38* 0.50  CALCIUM 9.3 8.0* 9.1  GFRNONAA >60 >60 >60  GFRAA >60 >60 >60  PROT 7.1  --  7.2  ALBUMIN 3.5  --  3.1*  AST 28  --  50*  ALT 19  --  53  ALKPHOS 74  --  137*  BILITOT 0.8  --  0.1*    RADIOGRAPHIC STUDIES: I have personally reviewed the radiological images as listed and agreed with the findings in the report. Dg Chest 1 View  03/29/2016  CLINICAL DATA:  Status post biopsy left lung mass EXAM: CHEST 1 VIEW COMPARISON:  Chest CT March 05, 2016 FINDINGS: No demonstrable pneumothorax. The mass in the posteromedial left upper lobe overlying left hilum is again noted. Lungs elsewhere clear. Heart size and pulmonary vascularity are normal. No adenopathy is evident. IMPRESSION: Persistent mass in the posteromedial left upper lobe overlying the left hilum. No new opacity. No pneumothorax. Electronically Signed   By: Lowella Grip III M.D.   On: 03/29/2016 13:36   Ct Head W Wo Contrast  04/15/2016  CLINICAL DATA:  Squamous cell lung cancer.  Short-term memory loss. EXAM: CT HEAD WITHOUT AND WITH CONTRAST TECHNIQUE: Contiguous axial images were obtained from the base of the skull through the vertex without and with intravenous contrast CONTRAST:  91m ISOVUE-300 IOPAMIDOL (ISOVUE-300) INJECTION 61% COMPARISON:  None. FINDINGS: Skull and Sinuses:No sclerotic or lytic structure to suggest metastasis. Visualized orbits: Negative. Brain: No evidence of  metastasis. No explanation for memory loss. No acute or remote infarction, hemorrhage, hydrocephalus, or mass lesion/mass effect. IMPRESSION: Normal study.  No evidence of metastasis. Electronically Signed   By: JMonte FantasiaM.D.   On: 04/15/2016 11:19   Nm Pet Image Initial (pi) Skull Base To Thigh  04/14/2016  CLINICAL DATA:  Initial treatment strategy for squamous cell carcinoma of the Lung. EXAM: NUCLEAR MEDICINE PET SKULL BASE TO THIGH TECHNIQUE: 12.1 mCi F-18 FDG was injected intravenously. Full-ring PET imaging was performed from the skull base to thigh after the radiotracer. CT data was obtained and used for attenuation correction and anatomic localization. FASTING  BLOOD GLUCOSE:  Value: 145 mg/dl COMPARISON:  CT chest abdomen pelvis 03/05/2016. FINDINGS: NECK No hypermetabolic lymph nodes in the neck. CT images show no acute findings. CHEST Large left lower lobe soft tissue mass crosses the left major fissure into the left upper lobe, measures roughly 4.9 x 6.9 cm and has an SUV max of 13.6. This SUV measurement includes contiguous hypermetabolic left hilar adenopathy. Mild hypermetabolism in the subcarinal region may be due to sub cm adenopathy versus esophagus. Lack of IV contrast is limiting. Otherwise, no hypermetabolic mediastinal or axillary lymph nodes. Heart size normal. No pericardial or pleural effusion. Left lower lobe superior segmental bronchus is obstructed by the mass. ABDOMEN/PELVIS A subtle low-attenuation lesion in the left hepatic lobe measures approximately 2.4 cm (CT image 145) with an SUV max of 6.5. No additional abnormal hypermetabolism in the liver, adrenal glands, spleen or pancreas. No hypermetabolic lymph nodes. Liver is decreased in attenuation diffusely. Gallbladder, adrenal glands, kidneys, spleen, pancreas, stomach and bowel are otherwise grossly unremarkable. IVC filter is in place. No free fluid. SKELETON No abnormal osseous hypermetabolism. IMPRESSION: 1.  Hypermetabolic left lower lobe mass with hypermetabolic left hilar adenopathy and left hepatic lobe lesion. Findings are consistent with stage IV primary bronchogenic carcinoma. 2. Mildly hypermetabolic subcarinal lymph node versus mild esophageal uptake. Attention on followup exams is warranted. 3. Hepatic steatosis. Electronically Signed   By: Lorin Picket M.D.   On: 04/14/2016 10:23   Ct Biopsy  03/29/2016  INDICATION: MEDIAL LEFT LOWER LOBE LUNG MASS EXAM: CT-GUIDED BIOPSY LEFT LOWER LOBE MEDIAL MASS MEDICATIONS: 1% lidocaine locally ANESTHESIA/SEDATION: 1.0 mg IV Versed; 50 mcg IV Fentanyl Moderate Sedation Time:  10 minutes The patient was continuously monitored during the procedure by the interventional radiology nurse under my direct supervision. PROCEDURE: The procedure, risks, benefits, and alternatives were explained to the patient. Questions regarding the procedure were encouraged and answered. The patient understands and consents to the procedure. Previous imaging reviewed. Patient positioned left side down decubitus. Noncontrast localization CT performed. The medial left lower lobe mass was localized. The medial posterior left back was prepped with ChloraPrep in a sterile fashion, and a sterile drape was applied covering the operative field. A sterile gown and sterile gloves were used for the procedure. Under CT guidance, a(n) 17 gauge guide needle was advanced into the medial left lower lobe mass. 3 18 gauge core biopsies were obtained. The guide needle was removed. Biosentry device utilized to aide in pneumothorax prevention. Final imaging was performed. Patient tolerated the procedure well without complication. Vital sign monitoring by nursing staff during the procedure will continue as patient is in the special procedures unit for post procedure observation. FINDINGS: The images document guide needle placement within the medial left lower lobe mass. Post biopsy images demonstrate no effusion,  hemorrhage or pneumothorax. COMPLICATIONS: None immediate. IMPRESSION: Successful CT-guided medial left lower lobe mass 18 gauge core biopsies Electronically Signed   By: Jerilynn Mages.  Shick M.D.   On: 03/29/2016 12:39    ASSESSMENT & PLAN:   # METASTATIC SQUAMOUS CELL CA of lung-  Left lower lobe to liver.  CT brain negative.  Review the PET scan  Images with the patient.  Review the stage- being stage IV/ advanced;  And the goal of treatment being palliative/ slowing down the growth of the disease.  She understands that the treatments are  not curative.  I discussed the  Carboplatin and Abraxane chemotherapy schedule at length.  She understands that she will get a  Restaging CT scan after 2 or 3 cycles of chemotherapy.  The total number of cycles of chemotherapy is 4-6.   Discussed the potential side effects including but not limited to-increasing fatigue, nausea vomiting, diarrhea, hair loss, sores in the mouth, increase risk of infection and also neuropathy. # Chemotherapy education; port placement. Hopefully the planned start chemotherapy next week. Antiemetics-Zofran and Compazine; EMLA cream sent to pharmacy.   #  Mild  Left lateral wall chest pain-  Discussed that if the pain gets worse palliative radiation could be recommended.  #  We will tentatively plan starting chemotherapy next week/  See Dr. Mike Gip patient does not want to get labs today.  Plan get labs at next visit.   #  Weight loss poor appetite-  Declined  dietary referral.  All questions were answered. The patient knows to call the clinic with any problems, questions or concerns.  Patient will follow-up with Dr. Mike Gip next week/ tentatively plan starting chemotherapy.     Cammie Sickle, MD 04/22/2016 1:50 PM

## 2016-04-22 NOTE — Progress Notes (Signed)
Patient here for follow up

## 2016-04-22 NOTE — Progress Notes (Signed)
Pt came to clinic today for f/u with Dr. Rogue Bussing for pathology/scan results. Pt has been evaluated by Dr. Mike Gip. Pt here to discuss treatment planning.  Pt arrived in anxious and worried state.  She refused labs today and asked that these labs be drawn at the next visit. Pt voiced concerned over change in taste buds/smell. "I just don't have an appetite anymore."  "The only things I'm able to eat is yogurts. I don't like boost and ensure. It makes me sick to my stomach."  Dietary referral was offered; however patient declined. Pt very emotional over dx of lung cancer. She states that she does not have any family support. "this is my choice. I don't want my family knowing that I have cancer."  Pt's states her main stressor right now is r/t to "fear of loosing my job. I'm trying to balance all these appointments. I think I'm going to get fired for taking too much time off of work.  My job is being fazed out. I was going to apply for a new job, but now, since I'm having to take so much time off of work I just might loose that opportunity." I offered to complete any fmla papers/disability papers. She declined this option. I offered to write a letter for her work stating the necessity for being out of work. She states that this would be fine. I asked her if I could include her diagnosis in the letter. She states that I could write the letter "to whom this concerns". "You can state that I have metastatic lung cancer and i'm  going to be scheduled for chemo.  Letter written; however, pt refused to wait in clinic while letter was being transcribed.  She asked the the letter be mailed to her home.  I offered a Education officer, museum consult, chaplin services and counseling services. Pt declined an apt with these resources at this time.  Burgess Estelle in Navigation made aware of patient's concerns. He will call pt tomorrow to f/u on today's office visit.

## 2016-04-23 DIAGNOSIS — C3432 Malignant neoplasm of lower lobe, left bronchus or lung: Secondary | ICD-10-CM | POA: Diagnosis present

## 2016-04-23 DIAGNOSIS — Z86718 Personal history of other venous thrombosis and embolism: Secondary | ICD-10-CM | POA: Diagnosis not present

## 2016-04-23 DIAGNOSIS — R600 Localized edema: Secondary | ICD-10-CM | POA: Diagnosis not present

## 2016-04-23 DIAGNOSIS — D649 Anemia, unspecified: Secondary | ICD-10-CM | POA: Diagnosis not present

## 2016-04-23 DIAGNOSIS — Z79899 Other long term (current) drug therapy: Secondary | ICD-10-CM | POA: Diagnosis not present

## 2016-04-23 DIAGNOSIS — Z5111 Encounter for antineoplastic chemotherapy: Secondary | ICD-10-CM | POA: Diagnosis not present

## 2016-04-23 DIAGNOSIS — R21 Rash and other nonspecific skin eruption: Secondary | ICD-10-CM | POA: Diagnosis not present

## 2016-04-23 DIAGNOSIS — E876 Hypokalemia: Secondary | ICD-10-CM | POA: Diagnosis not present

## 2016-04-23 DIAGNOSIS — C787 Secondary malignant neoplasm of liver and intrahepatic bile duct: Secondary | ICD-10-CM | POA: Diagnosis not present

## 2016-04-23 DIAGNOSIS — F17210 Nicotine dependence, cigarettes, uncomplicated: Secondary | ICD-10-CM | POA: Diagnosis not present

## 2016-04-23 DIAGNOSIS — K760 Fatty (change of) liver, not elsewhere classified: Secondary | ICD-10-CM | POA: Diagnosis not present

## 2016-04-27 ENCOUNTER — Telehealth: Payer: Self-pay | Admitting: *Deleted

## 2016-04-27 ENCOUNTER — Other Ambulatory Visit: Payer: Self-pay | Admitting: Vascular Surgery

## 2016-04-27 ENCOUNTER — Inpatient Hospital Stay: Payer: BLUE CROSS/BLUE SHIELD

## 2016-04-27 NOTE — Telephone Encounter (Signed)
Pt called and left me a message that she is going to get fmla forms sent to her and she will have 15 days to turn it back in. I called her back and left her a message that she should fax the forms to my attn at [phone] and I will do them as quick as possible but usually 7-10 days when I get them.

## 2016-04-27 NOTE — Patient Instructions (Signed)
Nanoparticle Albumin-Bound Paclitaxel injection What is this medicine? NANOPARTICLE ALBUMIN-BOUND PACLITAXEL (Na no PAHR ti kuhl al BYOO muhn-bound PAK li TAX el) is a chemotherapy drug. It targets fast dividing cells, like cancer cells, and causes these cells to die. This medicine is used to treat advanced breast cancer and advanced lung cancer. This medicine may be used for other purposes; ask your health care provider or pharmacist if you have questions. What should I tell my health care provider before I take this medicine? They need to know if you have any of these conditions: -kidney disease -liver disease -low blood counts, like low platelets, red blood cells, or white blood cells -recent or ongoing radiation therapy -an unusual or allergic reaction to paclitaxel, albumin, other chemotherapy, other medicines, foods, dyes, or preservatives -pregnant or trying to get pregnant -breast-feeding How should I use this medicine? This drug is given as an infusion into a vein. It is administered in a hospital or clinic by a specially trained health care professional. Talk to your pediatrician regarding the use of this medicine in children. Special care may be needed. Overdosage: If you think you have taken too much of this medicine contact a poison control center or emergency room at once. NOTE: This medicine is only for you. Do not share this medicine with others. What if I miss a dose? It is important not to miss your dose. Call your doctor or health care professional if you are unable to keep an appointment. What may interact with this medicine? -cyclosporine -diazepam -ketoconazole -medicines to increase blood counts like filgrastim, pegfilgrastim, sargramostim -other chemotherapy drugs like cisplatin, doxorubicin, epirubicin, etoposide, teniposide, vincristine -quinidine -testosterone -vaccines -verapamil Talk to your doctor or health care professional before taking any of these  medicines: -acetaminophen -aspirin -ibuprofen -ketoprofen -naproxen This list may not describe all possible interactions. Give your health care provider a list of all the medicines, herbs, non-prescription drugs, or dietary supplements you use. Also tell them if you smoke, drink alcohol, or use illegal drugs. Some items may interact with your medicine. What should I watch for while using this medicine? Your condition will be monitored carefully while you are receiving this medicine. You will need important blood work done while you are taking this medicine. This drug may make you feel generally unwell. This is not uncommon, as chemotherapy can affect healthy cells as well as cancer cells. Report any side effects. Continue your course of treatment even though you feel ill unless your doctor tells you to stop. In some cases, you may be given additional medicines to help with side effects. Follow all directions for their use. Call your doctor or health care professional for advice if you get a fever, chills or sore throat, or other symptoms of a cold or flu. Do not treat yourself. This drug decreases your body's ability to fight infections. Try to avoid being around people who are sick. This medicine may increase your risk to bruise or bleed. Call your doctor or health care professional if you notice any unusual bleeding. Be careful brushing and flossing your teeth or using a toothpick because you may get an infection or bleed more easily. If you have any dental work done, tell your dentist you are receiving this medicine. Avoid taking products that contain aspirin, acetaminophen, ibuprofen, naproxen, or ketoprofen unless instructed by your doctor. These medicines may hide a fever. Do not become pregnant while taking this medicine. Women should inform their doctor if they wish to become pregnant or  think they might be pregnant. There is a potential for serious side effects to an unborn child. Talk to  your health care professional or pharmacist for more information. Do not breast-feed an infant while taking this medicine. Men are advised not to father a child while receiving this medicine. What side effects may I notice from receiving this medicine? Side effects that you should report to your doctor or health care professional as soon as possible: -allergic reactions like skin rash, itching or hives, swelling of the face, lips, or tongue -low blood counts - This drug may decrease the number of white blood cells, red blood cells and platelets. You may be at increased risk for infections and bleeding. -signs of infection - fever or chills, cough, sore throat, pain or difficulty passing urine -signs of decreased platelets or bleeding - bruising, pinpoint red spots on the skin, black, tarry stools, nosebleeds -signs of decreased red blood cells - unusually weak or tired, fainting spells, lightheadedness -breathing problems -changes in vision -chest pain -high or low blood pressure -mouth sores -nausea and vomiting -pain, swelling, redness or irritation at the injection site -pain, tingling, numbness in the hands or feet -slow or irregular heartbeat -swelling of the ankle, feet, hands Side effects that usually do not require medical attention (report to your doctor or health care professional if they continue or are bothersome): -aches, pains -changes in the color of fingernails -diarrhea -hair loss -loss of appetite This list may not describe all possible side effects. Call your doctor for medical advice about side effects. You may report side effects to FDA at 1-800-FDA-1088. Where should I keep my medicine? This drug is given in a hospital or clinic and will not be stored at home. NOTE: This sheet is a summary. It may not cover all possible information. If you have questions about this medicine, talk to your doctor, pharmacist, or health care provider.    2016, [employer].  (2013-01-01 16:48:50) Carboplatin injection What is this medicine? CARBOPLATIN (KAR boe pla tin) is a chemotherapy drug. It targets fast dividing cells, like cancer cells, and causes these cells to die. This medicine is used to treat ovarian cancer and many other cancers. This medicine may be used for other purposes; ask your health care provider or pharmacist if you have questions. What should I tell my health care provider before I take this medicine? They need to know if you have any of these conditions: -blood disorders -hearing problems -kidney disease -recent or ongoing radiation therapy -an unusual or allergic reaction to carboplatin, cisplatin, other chemotherapy, other medicines, foods, dyes, or preservatives -pregnant or trying to get pregnant -breast-feeding How should I use this medicine? This drug is usually given as an infusion into a vein. It is administered in a hospital or clinic by a specially trained health care professional. Talk to your pediatrician regarding the use of this medicine in children. Special care may be needed. Overdosage: If you think you have taken too much of this medicine contact a poison control center or emergency room at once. NOTE: This medicine is only for you. Do not share this medicine with others. What if I miss a dose? It is important not to miss a dose. Call your doctor or health care professional if you are unable to keep an appointment. What may interact with this medicine? -medicines for seizures -medicines to increase blood counts like filgrastim, pegfilgrastim, sargramostim -some antibiotics like amikacin, gentamicin, neomycin, streptomycin, tobramycin -vaccines Talk to your doctor or  health care professional before taking any of these medicines: -acetaminophen -aspirin -ibuprofen -ketoprofen -naproxen This list may not describe all possible interactions. Give your health care provider a list of all the medicines, herbs,  non-prescription drugs, or dietary supplements you use. Also tell them if you smoke, drink alcohol, or use illegal drugs. Some items may interact with your medicine. What should I watch for while using this medicine? Your condition will be monitored carefully while you are receiving this medicine. You will need important blood work done while you are taking this medicine. This drug may make you feel generally unwell. This is not uncommon, as chemotherapy can affect healthy cells as well as cancer cells. Report any side effects. Continue your course of treatment even though you feel ill unless your doctor tells you to stop. In some cases, you may be given additional medicines to help with side effects. Follow all directions for their use. Call your doctor or health care professional for advice if you get a fever, chills or sore throat, or other symptoms of a cold or flu. Do not treat yourself. This drug decreases your body's ability to fight infections. Try to avoid being around people who are sick. This medicine may increase your risk to bruise or bleed. Call your doctor or health care professional if you notice any unusual bleeding. Be careful brushing and flossing your teeth or using a toothpick because you may get an infection or bleed more easily. If you have any dental work done, tell your dentist you are receiving this medicine. Avoid taking products that contain aspirin, acetaminophen, ibuprofen, naproxen, or ketoprofen unless instructed by your doctor. These medicines may hide a fever. Do not become pregnant while taking this medicine. Women should inform their doctor if they wish to become pregnant or think they might be pregnant. There is a potential for serious side effects to an unborn child. Talk to your health care professional or pharmacist for more information. Do not breast-feed an infant while taking this medicine. What side effects may I notice from receiving this medicine? Side effects  that you should report to your doctor or health care professional as soon as possible: -allergic reactions like skin rash, itching or hives, swelling of the face, lips, or tongue -signs of infection - fever or chills, cough, sore throat, pain or difficulty passing urine -signs of decreased platelets or bleeding - bruising, pinpoint red spots on the skin, black, tarry stools, nosebleeds -signs of decreased red blood cells - unusually weak or tired, fainting spells, lightheadedness -breathing problems -changes in hearing -changes in vision -chest pain -high blood pressure -low blood counts - This drug may decrease the number of white blood cells, red blood cells and platelets. You may be at increased risk for infections and bleeding. -nausea and vomiting -pain, swelling, redness or irritation at the injection site -pain, tingling, numbness in the hands or feet -problems with balance, talking, walking -trouble passing urine or change in the amount of urine Side effects that usually do not require medical attention (report to your doctor or health care professional if they continue or are bothersome): -hair loss -loss of appetite -metallic taste in the mouth or changes in taste This list may not describe all possible side effects. Call your doctor for medical advice about side effects. You may report side effects to FDA at 1-800-FDA-1088. Where should I keep my medicine? This drug is given in a hospital or clinic and will not be stored at home. NOTE: This  sheet is a summary. It may not cover all possible information. If you have questions about this medicine, talk to your doctor, pharmacist, or health care provider.    2016, [employer]. (2008-02-13 14:38:05)

## 2016-04-28 ENCOUNTER — Encounter
Admission: RE | Disposition: A | Discharge status: Home / Self Care | Payer: Self-pay | Source: Ambulatory Visit | Attending: Vascular Surgery

## 2016-04-28 ENCOUNTER — Ambulatory Visit
Admission: RE | Admit: 2016-04-28 | Discharge: 2016-04-28 | Disposition: A | Discharge status: Home / Self Care | Payer: BLUE CROSS/BLUE SHIELD | Source: Ambulatory Visit | Attending: Vascular Surgery | Admitting: Vascular Surgery

## 2016-04-28 ENCOUNTER — Other Ambulatory Visit: Payer: Self-pay | Admitting: Hematology and Oncology

## 2016-04-28 DIAGNOSIS — Z86718 Personal history of other venous thrombosis and embolism: Secondary | ICD-10-CM | POA: Insufficient documentation

## 2016-04-28 DIAGNOSIS — Z780 Asymptomatic menopausal state: Secondary | ICD-10-CM | POA: Insufficient documentation

## 2016-04-28 DIAGNOSIS — F1010 Alcohol abuse, uncomplicated: Secondary | ICD-10-CM | POA: Insufficient documentation

## 2016-04-28 DIAGNOSIS — R0789 Other chest pain: Secondary | ICD-10-CM | POA: Diagnosis not present

## 2016-04-28 DIAGNOSIS — Z803 Family history of malignant neoplasm of breast: Secondary | ICD-10-CM | POA: Diagnosis not present

## 2016-04-28 DIAGNOSIS — Z9012 Acquired absence of left breast and nipple: Secondary | ICD-10-CM | POA: Diagnosis not present

## 2016-04-28 DIAGNOSIS — R634 Abnormal weight loss: Secondary | ICD-10-CM | POA: Insufficient documentation

## 2016-04-28 DIAGNOSIS — C3490 Malignant neoplasm of unspecified part of unspecified bronchus or lung: Secondary | ICD-10-CM | POA: Diagnosis present

## 2016-04-28 DIAGNOSIS — Z800 Family history of malignant neoplasm of digestive organs: Secondary | ICD-10-CM | POA: Diagnosis not present

## 2016-04-28 DIAGNOSIS — Z808 Family history of malignant neoplasm of other organs or systems: Secondary | ICD-10-CM | POA: Insufficient documentation

## 2016-04-28 DIAGNOSIS — F17210 Nicotine dependence, cigarettes, uncomplicated: Secondary | ICD-10-CM | POA: Diagnosis not present

## 2016-04-28 HISTORY — PX: PERIPHERAL VASCULAR CATHETERIZATION: SHX172C

## 2016-04-28 LAB — GLUCOSE, CAPILLARY: GLUCOSE-CAPILLARY: 142 mg/dL — AB (ref 65–99)

## 2016-04-28 SURGERY — PORTA CATH INSERTION
Anesthesia: Moderate Sedation

## 2016-04-28 MED ORDER — MIDAZOLAM HCL 5 MG/5ML IJ SOLN
INTRAMUSCULAR | Status: AC
  Filled 2016-04-28: qty 5

## 2016-04-28 MED ORDER — SODIUM CHLORIDE 0.9 % IR SOLN
80.0000 mg | Freq: Once | Status: DC
  Filled 2016-04-28: qty 2

## 2016-04-28 MED ORDER — FENTANYL CITRATE (PF) 100 MCG/2ML IJ SOLN
INTRAMUSCULAR | Status: DC | PRN
  Administered 2016-04-28 (×2): 50 ug via INTRAVENOUS

## 2016-04-28 MED ORDER — MIDAZOLAM HCL 2 MG/2ML IJ SOLN
INTRAMUSCULAR | Status: DC | PRN
  Administered 2016-04-28: 2 mg via INTRAVENOUS
  Administered 2016-04-28: 1 mg via INTRAVENOUS

## 2016-04-28 MED ORDER — LIDOCAINE-EPINEPHRINE (PF) 1 %-1:200000 IJ SOLN
INTRAMUSCULAR | Status: DC | PRN
  Administered 2016-04-28: 20 mL via INTRADERMAL

## 2016-04-28 MED ORDER — CLINDAMYCIN PHOSPHATE 300 MG/50ML IV SOLN: 300.0000 mg | Freq: Once | INTRAVENOUS | Status: DC

## 2016-04-28 MED ORDER — CLINDAMYCIN PHOSPHATE 300 MG/50ML IV SOLN
INTRAVENOUS | Status: AC
  Administered 2016-04-28: 14:00:00
  Filled 2016-04-28: qty 50

## 2016-04-28 MED ORDER — SODIUM CHLORIDE 0.9 % IV SOLN
INTRAVENOUS | Status: DC
  Administered 2016-04-28: 13:00:00 via INTRAVENOUS

## 2016-04-28 MED ORDER — FENTANYL CITRATE (PF) 100 MCG/2ML IJ SOLN
INTRAMUSCULAR | Status: AC
  Filled 2016-04-28: qty 2

## 2016-04-28 MED ORDER — LIDOCAINE-EPINEPHRINE (PF) 1 %-1:200000 IJ SOLN
INTRAMUSCULAR | Status: AC
  Filled 2016-04-28: qty 30

## 2016-04-28 SURGICAL SUPPLY — 10 items
BAG DECANTER STRL (MISCELLANEOUS) ×3 IMPLANT
KIT PORT POWER 8FR ISP CVUE (Catheter) ×3 IMPLANT
PACK ANGIOGRAPHY (CUSTOM PROCEDURE TRAY) ×3 IMPLANT
PAD GROUND ADULT SPLIT (MISCELLANEOUS) ×3 IMPLANT
PENCIL ELECTRO HAND CTR (MISCELLANEOUS) ×3 IMPLANT
PREP CHG 10.5 TEAL (MISCELLANEOUS) ×3 IMPLANT
SUT MNCRL AB 4-0 PS2 18 (SUTURE) ×3 IMPLANT
SUT PROLENE 0 CT 1 30 (SUTURE) ×3 IMPLANT
SUTURE VIC 3-0 (SUTURE) ×3 IMPLANT
TOWEL OR 17X26 4PK STRL BLUE (TOWEL DISPOSABLE) ×3 IMPLANT

## 2016-04-28 NOTE — Op Note (Signed)
      [facility]       Operative Note  Date: 04/28/2016  Preoperative diagnosis:  1. Lung cancer  Postoperative diagnosis:  Same as above  Procedures: #1. Ultrasound guidance for vascular access to the right internal jugular vein. #2. Fluoroscopic guidance for placement of catheter. #3. Placement of CT compatible Port-A-Cath, right internal jugular vein.  Surgeon: Leotis Pain, MD.   Anesthesia: Local with moderate conscious sedation for approximately 15  minutes using 3 mg of Versed and 100 mcg of Fentanyl  Fluoroscopy time: less than 1 minute  Contrast used: 0  Estimated blood loss: minimal  Indication for the procedure:  The patient is a 55 y.o.female with lung cancer.  The patient needs a Port-A-Cath for durable venous access, chemotherapy, lab draws, and CT scans. We are asked to place this. Risks and benefits were discussed and informed consent was obtained.  Description of procedure: The patient was brought to the vascular and interventional radiology suite.  Moderate conscious sedation was administered throughout the procedure during a face to face encounter with the patient with my supervision of the RN administering medicines and monitoring the patient's vital signs, pulse oximetry, telemetry and mental status throughout from the start of the procedure until the patient was taken to the recovery room. The right neck chest and shoulder were sterilely prepped and draped, and a sterile surgical field was created. Ultrasound was used to help visualize a patent right internal jugular vein. This was then accessed under direct ultrasound guidance without difficulty with the Seldinger needle and a permanent image was recorded. A J-wire was placed. After skin nick and dilatation, the peel-away sheath was then placed over the wire. I then anesthetized an area under the clavicle approximately 1-2 fingerbreadths. A transverse incision was created and an inferior pocket was  created with electrocautery and blunt dissection. The port was then brought onto the field, placed into the pocket and secured to the chest wall with 2 Prolene sutures. The catheter was connected to the port and tunneled from the subclavicular incision to the access site. Fluoroscopic guidance was then used to cut the catheter to an appropriate length. The catheter was then placed through the peel-away sheath and the peel-away sheath was removed. The catheter tip was parked in excellent location under fluorocoscopic guidance in the SVC just above the right atrium. The pocket was then irrigated with antibiotic impregnated saline and the wound was closed with a running 3-0 Vicryl and a 4-0 Monocryl. The access incision was closed with a single 4-0 Monocryl. The Huber needle was used to withdraw blood and flush the port with heparinized saline. Dermabond was then placed as a dressing. The patient tolerated the procedure well and was taken to the recovery room in stable condition.   DEW,JASON 04/28/2016 2:29 PM

## 2016-04-28 NOTE — Discharge Instructions (Signed)

## 2016-04-28 NOTE — Progress Notes (Signed)
Patient arrived for pre-procedure and notified of delay of procedure due to previous emergency, patient very frustrated and rude to nursing staff. Patient requested to lay down and rest until procedure, refused to change into gown or get IV access at this time. Blood glucose checked and is WNL, patient attached to bedside cardiac monitoring. Father at bedside. Will continue to monitor.

## 2016-04-28 NOTE — H&P (Signed)
  [facility] History & Physical Update  The patient was interviewed and re-examined.  The patient's previous History and Physical has been reviewed and is unchanged.  There is no change in the plan of care. We plan to proceed with the scheduled procedure.  Carletta Feasel, MD  04/28/2016, 1:12 PM

## 2016-04-29 ENCOUNTER — Ambulatory Visit
Admission: RE | Admit: 2016-04-29 | Discharge: 2016-04-29 | Disposition: A | Discharge status: Home / Self Care | Payer: BLUE CROSS/BLUE SHIELD | Source: Ambulatory Visit | Attending: Hematology and Oncology | Admitting: Hematology and Oncology

## 2016-04-29 ENCOUNTER — Inpatient Hospital Stay: Payer: BLUE CROSS/BLUE SHIELD

## 2016-04-29 ENCOUNTER — Other Ambulatory Visit: Payer: Self-pay

## 2016-04-29 ENCOUNTER — Inpatient Hospital Stay (HOSPITAL_BASED_OUTPATIENT_CLINIC_OR_DEPARTMENT_OTHER): Payer: BLUE CROSS/BLUE SHIELD | Admitting: Hematology and Oncology

## 2016-04-29 ENCOUNTER — Encounter: Payer: Self-pay | Admitting: Vascular Surgery

## 2016-04-29 VITALS — BP 117/69 | HR 135 | Temp 100.9°F | Ht 65.0 in | Wt 103.7 lb

## 2016-04-29 DIAGNOSIS — R945 Abnormal results of liver function studies: Secondary | ICD-10-CM

## 2016-04-29 DIAGNOSIS — Z86718 Personal history of other venous thrombosis and embolism: Secondary | ICD-10-CM

## 2016-04-29 DIAGNOSIS — R160 Hepatomegaly, not elsewhere classified: Secondary | ICD-10-CM | POA: Diagnosis not present

## 2016-04-29 DIAGNOSIS — R7989 Other specified abnormal findings of blood chemistry: Secondary | ICD-10-CM

## 2016-04-29 DIAGNOSIS — K769 Liver disease, unspecified: Secondary | ICD-10-CM | POA: Insufficient documentation

## 2016-04-29 DIAGNOSIS — C3492 Malignant neoplasm of unspecified part of left bronchus or lung: Secondary | ICD-10-CM

## 2016-04-29 DIAGNOSIS — C801 Malignant (primary) neoplasm, unspecified: Secondary | ICD-10-CM

## 2016-04-29 DIAGNOSIS — E871 Hypo-osmolality and hyponatremia: Secondary | ICD-10-CM | POA: Insufficient documentation

## 2016-04-29 DIAGNOSIS — C787 Secondary malignant neoplasm of liver and intrahepatic bile duct: Secondary | ICD-10-CM | POA: Diagnosis not present

## 2016-04-29 DIAGNOSIS — R509 Fever, unspecified: Secondary | ICD-10-CM

## 2016-04-29 DIAGNOSIS — F17210 Nicotine dependence, cigarettes, uncomplicated: Secondary | ICD-10-CM | POA: Diagnosis not present

## 2016-04-29 DIAGNOSIS — R748 Abnormal levels of other serum enzymes: Secondary | ICD-10-CM

## 2016-04-29 DIAGNOSIS — Z79899 Other long term (current) drug therapy: Secondary | ICD-10-CM

## 2016-04-29 DIAGNOSIS — C3432 Malignant neoplasm of lower lobe, left bronchus or lung: Secondary | ICD-10-CM

## 2016-04-29 DIAGNOSIS — E876 Hypokalemia: Secondary | ICD-10-CM

## 2016-04-29 DIAGNOSIS — R000 Tachycardia, unspecified: Secondary | ICD-10-CM

## 2016-04-29 LAB — COMPREHENSIVE METABOLIC PANEL
ALT: 124 U/L — ABNORMAL HIGH (ref 14–54)
AST: 164 U/L — ABNORMAL HIGH (ref 15–41)
Albumin: 2.5 g/dL — ABNORMAL LOW (ref 3.5–5.0)
Alkaline Phosphatase: 191 U/L — ABNORMAL HIGH (ref 38–126)
Anion gap: 8 (ref 5–15)
BUN: 7 mg/dL (ref 6–20)
CO2: 26 mmol/L (ref 22–32)
Calcium: 9.1 mg/dL (ref 8.9–10.3)
Chloride: 96 mmol/L — ABNORMAL LOW (ref 101–111)
Creatinine, Ser: 0.67 mg/dL (ref 0.44–1.00)
GFR calc Af Amer: >60 mL/min (ref >60)
GFR calc non Af Amer: >60 mL/min (ref >60)
Glucose, Bld: 199 mg/dL — ABNORMAL HIGH (ref 65–99)
Potassium: 3.3 mmol/L — ABNORMAL LOW (ref 3.5–5.1)
Sodium: 130 mmol/L — ABNORMAL LOW (ref 135–145)
Total Bilirubin: 0.9 mg/dL (ref 0.3–1.2)
Total Protein: 6.2 g/dL — ABNORMAL LOW (ref 6.5–8.1)

## 2016-04-29 LAB — URINALYSIS COMPLETE WITH MICROSCOPIC (ARMC ONLY)
Bilirubin Urine: NEGATIVE
Glucose, UA: NEGATIVE mg/dL
Ketones, ur: NEGATIVE mg/dL
Leukocytes, UA: NEGATIVE
Nitrite: NEGATIVE
Protein, ur: 30 mg/dL — AB
Specific Gravity, Urine: 1.006 (ref 1.005–1.030)
pH: 6 (ref 5.0–8.0)

## 2016-04-29 LAB — CBC WITH DIFFERENTIAL/PLATELET
Basophils Absolute: 0.1 10*3/uL (ref 0–0.1)
Basophils Relative: 0 %
Eosinophils Absolute: 0 10*3/uL (ref 0–0.7)
Eosinophils Relative: 0 %
HCT: 28 % — ABNORMAL LOW (ref 35.0–47.0)
Hemoglobin: 9.3 g/dL — ABNORMAL LOW (ref 12.0–16.0)
Lymphocytes Relative: 2 %
Lymphs Abs: 0.7 10*3/uL — ABNORMAL LOW (ref 1.0–3.6)
MCH: 30.4 pg (ref 26.0–34.0)
MCHC: 33.2 g/dL (ref 32.0–36.0)
MCV: 91.6 fL (ref 80.0–100.0)
Monocytes Absolute: 1.7 10*3/uL — ABNORMAL HIGH (ref 0.2–0.9)
Monocytes Relative: 6 %
Neutro Abs: 26.6 10*3/uL — ABNORMAL HIGH (ref 1.4–6.5)
Neutrophils Relative %: 92 %
Platelets: 494 10*3/uL — ABNORMAL HIGH (ref 150–440)
RBC: 3.06 MIL/uL — ABNORMAL LOW (ref 3.80–5.20)
RDW: 14.7 % — ABNORMAL HIGH (ref 11.5–14.5)
WBC: 29 10*3/uL — ABNORMAL HIGH (ref 3.6–11.0)

## 2016-04-29 MED ORDER — SODIUM CHLORIDE 0.9% FLUSH
10.0000 mL | INTRAVENOUS | Status: AC | PRN
  Administered 2016-04-29: 10 mL via INTRAVENOUS
  Filled 2016-04-29: qty 10

## 2016-04-29 MED ORDER — HEPARIN SOD (PORK) LOCK FLUSH 100 UNIT/ML IV SOLN
INTRAVENOUS | Status: AC
  Filled 2016-04-29: qty 5

## 2016-04-29 MED ORDER — POTASSIUM CHLORIDE ER 10 MEQ PO TBCR: 10.0000 meq | EXTENDED_RELEASE_TABLET | Freq: Two times a day (BID) | ORAL | Status: DC

## 2016-04-29 MED ORDER — SODIUM CHLORIDE 0.9% FLUSH
10.0000 mL | INTRAVENOUS | Status: DC | PRN
  Administered 2016-04-29: 10 mL via INTRAVENOUS
  Filled 2016-04-29: qty 10

## 2016-04-29 MED ORDER — HEPARIN SOD (PORK) LOCK FLUSH 100 UNIT/ML IV SOLN
500.0000 [IU] | Freq: Once | INTRAVENOUS | Status: AC
  Administered 2016-04-29: 500 [IU] via INTRAVENOUS

## 2016-04-29 NOTE — Progress Notes (Signed)
[facility] day:  04/29/2016  Chief Complaint: Christina Mckee is a 55 y.o. female with metastatic squamous cell lung cancer who is seen for assessment prior to cycle #1 carboplatin and Abraxane.  HPI: The patient was last seen by me in the medical oncology clinic on 04/05/2016.  At that time, she was seen for initial assessment after her hospitalization.  CT guided left lower lobe biopsy on 03/29/2016 revealed moderately differentiated squamous cell carcinoma.  She was set up for PET scan and head CT.  Dr Baruch Gouty was consulted.  PET scan on 04/14/2016 revealed hypermetabolic left lower lobe mass with hypermetabolic left hilar adenopathy and left hepatic lobe lesion. Findings were consistent with stage IV primary bronchogenic carcinoma.  There were mildly hypermetabolic subcarinal lymph node versus mild esophageal uptake. There was hepatic steatosis.  Head CT on 04/15/2016 was normal.  Molecular studies revealed the following negative studies:  EGFR, ALK, ROS1, and PD-L1 1%.  She did not keep her appointment with Dr. Baruch Gouty.  During my absence, she saw Dr. Rogue Bussing on 04/22/2016.  We discussed her work-up and decision for carboplatin and Abraxane.  Preauthorization was requested.  Port-a-cath was placed by Dr. Lucky Cowboy on 04/28/2016.  She notes the site is very painful .  Symptomatically, she notes a chronic cough.  She had a fever last night.  She is eating soft things.  She denies any RUQ pain.  She drank a beer last night.  She notes stress.   Past Medical History  Diagnosis Date  . DVT (deep venous thrombosis) ([place])     in 2015  . Menopause     prior to 2014    Past Surgical History  Procedure Laterality Date  . Cesarean section    . Ivc filter placement ([facility])    . Breast cyst excision      left breast removal    Family History  Problem Relation Age of Onset  . Breast cancer Mother   . Breast cancer Maternal Aunt   . Stomach  cancer Paternal Uncle   . Leukemia Paternal Uncle     Social History:  reports that she has been smoking Cigarettes.  She has been smoking about 0.25 packs per day. She has never used smokeless tobacco. She reports that she drinks alcohol. She reports that she does not use illicit drugs.  She has smoked 1/2-1 pack a day since age 89. She has a 30-40 pack year smoking history.  She drinks 4 beers a day. She lives in [place] alone with her cats and dogs. She previously worked at [employer], but notes all positions were recently eliminated. The patient is accompanied by her father today.  Allergies:  Allergies  Allergen Reactions  . Penicillins Hives and Other (See Comments)    Has patient had a PCN reaction causing immediate rash, facial/tongue/throat swelling, SOB or lightheadedness with hypotension: No Has patient had a PCN reaction causing severe rash involving mucus membranes or skin necrosis: No Has patient had a PCN reaction that required hospitalization No Has patient had a PCN reaction occurring within the last 10 years: No If all of the above answers are "NO", then may proceed with Cephalosporin use.    Current Medications: Current Outpatient Prescriptions  Medication Sig Dispense Refill  . benzonatate (TESSALON) 200 MG capsule Take by mouth.    . fluticasone (FLONASE) 50 MCG/ACT nasal spray Place 1 spray into both nostrils daily as needed for allergies or rhinitis.    Marland Kitchen  guaifenesin (ROBITUSSIN) 100 MG/5ML syrup Take 200 mg by mouth 3 (three) times daily as needed for cough or congestion.    Marland Kitchen ibuprofen (ADVIL,MOTRIN) 200 MG tablet Take by mouth.    . lidocaine-prilocaine (EMLA) cream Apply 1 application topically as needed. 30 g 6  . metoprolol tartrate (LOPRESSOR) 25 MG tablet Take 1 tablet (25 mg total) by mouth 2 (two) times daily. 60 tablet 2  . nicotine (NICODERM CQ - DOSED IN MG/24 HOURS) 21 mg/24hr patch Place 1 patch (21 mg total) onto the skin daily. 28 patch 0  .  ondansetron (ZOFRAN) 8 MG tablet Take 1 tablet (8 mg total) by mouth 2 (two) times daily as needed for refractory nausea / vomiting. Start on day 3 after carboplatin chemo. 30 tablet 1  . prochlorperazine (COMPAZINE) 10 MG tablet Take 1 tablet (10 mg total) by mouth every 6 (six) hours as needed for nausea or vomiting. 30 tablet 6   No current facility-administered medications for this visit.   Facility-Administered Medications Ordered in Other Visits  Medication Dose Route Frequency Provider Last Rate Last Dose  . sodium chloride flush (NS) 0.9 % injection 10 mL  10 mL Intravenous PRN Lequita Asal, MD   10 mL at 04/29/16 6073    Review of Systems:  GENERAL: Fatigue. Fever last night.  No sweats.  Weight down 5 pounds since 04/05/2016. PERFORMANCE STATUS (ECOG): 1 HEENT: No visual changes, runny nose, sore throat, mouth sores or tenderness. Lungs: No shortness of breath. Chronic cough s/p Levaquin and azithromycin. No hemoptysis. Cardiac: No chest pain, palpitations, orthopnea, or PND. GI:  Able to eat soft foods.  Food sometimes causes nausea.  No vomiting, diarrhea, constipation, melena or hematochezia. GU: No urgency, frequency, dysuria, or hematuria. Musculoskeletal: No back pain. No joint pain. No muscle tenderness. Extremities: Chronic left lower extremity edema. Duplex on 03/05/2016 was negative.  No pain. Skin: No rashes or skin changes. Neuro: Short term memory issues.  No headache, numbness or weakness, balance or coordination issues. Endocrine: No diabetes, thyroid issues, hot flashes or night sweats. Psych: No mood changes, depression or anxiety. Pain: Pain associated with recent port-a-cath. Review of systems: All other systems reviewed and found to be negative  Physical Exam: Blood pressure 117/69, pulse 135, temperature 100.9 F (38.3 C), temperature source Tympanic, height '5\' 5"'  (1.651 m), weight 103 lb 11.6 oz (47.05 kg), last menstrual period  04/14/2004. GENERAL: Thin woman sitting comfortably in the exam room in no acute distress. MENTAL STATUS: Alert and oriented to person, place and time. HEAD: Shoulder length blonde hair. Normocephalic, atraumatic, face symmetric, no Cushingoid features. EYES: Blue eyes. Pupils equal round and reactive to light and accomodation. No conjunctivitis or scleral icterus. ENT: Oropharynx clear without lesion. Tongue normal. Mucous membranes moist.  RESPIRATORY: Clear to auscultation without rales, wheezes or rhonchi. CARDIOVASCULAR: Regular rate and rhythm without murmur, rub or gallop. CHEST WALL:  Port-a-cath site accessed with overlying Tegaderm.  Site unremarkable. ABDOMEN: Soft, non-tender, with active bowel sounds, and no hepatosplenomegaly. No masses. SKIN: Spider angiomas on chest.  No rashes, ulcers or lesions. EXTREMITIES: Slight left lower extremity edema. No skin discoloration or tenderness. No palpable cords. LYMPH NODES: No palpable cervical, supraclavicular, axillary or inguinal adenopathy  NEUROLOGICAL: Appropriate. PSYCH:  Appropriate.   Clinical Support on 04/29/2016  Component Date Value Ref Range Status  . WBC 04/29/2016 29.0* 3.6 - 11.0 K/uL Final  . RBC 04/29/2016 3.06* 3.80 - 5.20 MIL/uL Final  . Hemoglobin 04/29/2016 9.3*  12.0 - 16.0 g/dL Final  . HCT 04/29/2016 28.0* 35.0 - 47.0 % Final  . MCV 04/29/2016 91.6  80.0 - 100.0 fL Final  . MCH 04/29/2016 30.4  26.0 - 34.0 pg Final  . MCHC 04/29/2016 33.2  32.0 - 36.0 g/dL Final  . RDW 04/29/2016 14.7* 11.5 - 14.5 % Final  . Platelets 04/29/2016 494* 150 - 440 K/uL Final  . Neutrophils Relative % 04/29/2016 92   Final  . Neutro Abs 04/29/2016 26.6* 1.4 - 6.5 K/uL Final  . Lymphocytes Relative 04/29/2016 2   Final  . Lymphs Abs 04/29/2016 0.7* 1.0 - 3.6 K/uL Final  . Monocytes Relative 04/29/2016 6   Final  . Monocytes Absolute 04/29/2016 1.7* 0.2 - 0.9 K/uL Final  . Eosinophils Relative 04/29/2016 0    Final  . Eosinophils Absolute 04/29/2016 0.0  0 - 0.7 K/uL Final  . Basophils Relative 04/29/2016 0   Final  . Basophils Absolute 04/29/2016 0.1  0 - 0.1 K/uL Final  Admission on 04/28/2016, Discharged on 04/28/2016  Component Date Value Ref Range Status  . Glucose-Capillary 04/28/2016 142* 65 - 99 mg/dL Final    Assessment:  VAANI MORREN is a 55 y.o. female with metastatic squamous cell lung cancer.  Molecular studies were negative studies for EGFR, ALK, and ROS1.  PD-L1 was 1%.  Chest CT angiogram on 03/05/2016 revealed 8.7 x 5.7 x 3.3 cm mass in the medial aspect of the superior segment of the left lower lobe extending into the medial aspect of the left upper lobe and into the left hilum. There was vascular and endobronchial encasement and narrowing. There were multiple small left lung nodules. Abdomen and pelvic CT scan revealed diffuse hepatic steatosis and no evidence of metastatic disease in the abdomen or pelvis.  CT guided left lower lobe biopsy on 03/29/2016 revealed moderately differentiated squamous cell carcinoma.  CEA and LDH were normal on 04/05/2016.  PET scan on 04/14/2016 revealed hypermetabolic left lower lobe mass with hypermetabolic left hilar adenopathy and left hepatic lobe lesion. Findings were consistent with stage IV primary bronchogenic carcinoma.  There were mildly hypermetabolic subcarinal lymph node versus mild esophageal uptake. There was hepatic steatosis.  Head CT on 04/15/2016 was normal.  She has a history of an extensive left lower extremity thromboses in 12/2012. Because of the extensive clot burden, she underwent TPA with angioplasty on 12/28/2012. An IVC filter was placed on 12/29/2012 and still remains. Hypercoagulable workup was negative for Factor V Leiden, prothrombin gene mutation, lupus anticoagulant, and antithrombin III.  She was on Xarelto for 6 months.  Symptomatically, she has tenderness associated with her newly placed port.  She is  tachycardic (pulse 135).  She has had a fever.  WBC is elevated.  Liver function tests are elevated.  She has hypokalemia.  She drank alcohol last evening.   Plan: 1.  Review PET scan, head CT, markers, and chemotherapy plan.  Side effects reviewed.  Patient consented to treatment. 2.  Discuss multiple issues today.  Discuss fever work-up (CXR, blood culture, UA and culture).  Discuss EKG.  Discuss RUQ ultrasound.  Discuss postponing chemotherapy 1 day for evaluation. 3.  Labs today:  CBC with diff, CMP. 4.  Additional labs today:  hepatitis B surface antigen, hepatitis B core antibody total, hepatitis C antibody.  5.  Fever work-up:  Blood cultures.  UA and culture.  CXR. 6.  EKG- r/o arrythmia. 7.  RUQ ultrasound- r/o biliary ductal dilatation. 8.  Rx:  potassium chloride 10 meq po BID. 9.  RTC tomorrow for MD assess, labs (CBC with diff, CMP), and day 1 of cycle #1 carboplatin and Abraxane.   Lequita Asal, MD  04/29/2016, 9:54 AM

## 2016-04-29 NOTE — Progress Notes (Signed)
Patient here for pre treatment assessment. Elevated HR 135, elevated temp, patient at pain level 8 from port placement.

## 2016-04-30 ENCOUNTER — Inpatient Hospital Stay (HOSPITAL_BASED_OUTPATIENT_CLINIC_OR_DEPARTMENT_OTHER): Payer: BLUE CROSS/BLUE SHIELD | Admitting: Hematology and Oncology

## 2016-04-30 ENCOUNTER — Inpatient Hospital Stay: Payer: BLUE CROSS/BLUE SHIELD

## 2016-04-30 VITALS — BP 113/67 | HR 118 | Temp 97.9°F | Resp 17 | Ht 65.0 in | Wt 103.2 lb

## 2016-04-30 DIAGNOSIS — K760 Fatty (change of) liver, not elsewhere classified: Secondary | ICD-10-CM

## 2016-04-30 DIAGNOSIS — E876 Hypokalemia: Secondary | ICD-10-CM

## 2016-04-30 DIAGNOSIS — C3492 Malignant neoplasm of unspecified part of left bronchus or lung: Secondary | ICD-10-CM

## 2016-04-30 DIAGNOSIS — C787 Secondary malignant neoplasm of liver and intrahepatic bile duct: Secondary | ICD-10-CM

## 2016-04-30 DIAGNOSIS — C3432 Malignant neoplasm of lower lobe, left bronchus or lung: Secondary | ICD-10-CM | POA: Diagnosis not present

## 2016-04-30 DIAGNOSIS — Z79899 Other long term (current) drug therapy: Secondary | ICD-10-CM

## 2016-04-30 DIAGNOSIS — R748 Abnormal levels of other serum enzymes: Secondary | ICD-10-CM

## 2016-04-30 DIAGNOSIS — Z86718 Personal history of other venous thrombosis and embolism: Secondary | ICD-10-CM

## 2016-04-30 DIAGNOSIS — F17210 Nicotine dependence, cigarettes, uncomplicated: Secondary | ICD-10-CM

## 2016-04-30 LAB — CBC WITH DIFFERENTIAL/PLATELET
Basophils Absolute: 0.2 10*3/uL — ABNORMAL HIGH (ref 0–0.1)
Basophils Relative: 1 %
Eosinophils Absolute: 0 10*3/uL (ref 0–0.7)
Eosinophils Relative: 0 %
HCT: 26.6 % — ABNORMAL LOW (ref 35.0–47.0)
Hemoglobin: 9 g/dL — ABNORMAL LOW (ref 12.0–16.0)
Lymphocytes Relative: 2 %
Lymphs Abs: 0.7 10*3/uL — ABNORMAL LOW (ref 1.0–3.6)
MCH: 30.8 pg (ref 26.0–34.0)
MCHC: 33.7 g/dL (ref 32.0–36.0)
MCV: 91.4 fL (ref 80.0–100.0)
Monocytes Absolute: 1.6 10*3/uL — ABNORMAL HIGH (ref 0.2–0.9)
Monocytes Relative: 5 %
Neutro Abs: 30.1 10*3/uL — ABNORMAL HIGH (ref 1.4–6.5)
Neutrophils Relative %: 92 %
Platelets: 445 10*3/uL — ABNORMAL HIGH (ref 150–440)
RBC: 2.91 MIL/uL — ABNORMAL LOW (ref 3.80–5.20)
RDW: 14.7 % — ABNORMAL HIGH (ref 11.5–14.5)
WBC: 32.6 10*3/uL — ABNORMAL HIGH (ref 3.6–11.0)

## 2016-04-30 LAB — COMPREHENSIVE METABOLIC PANEL
ALT: 100 U/L — ABNORMAL HIGH (ref 14–54)
AST: 76 U/L — ABNORMAL HIGH (ref 15–41)
Albumin: 2.5 g/dL — ABNORMAL LOW (ref 3.5–5.0)
Alkaline Phosphatase: 172 U/L — ABNORMAL HIGH (ref 38–126)
Anion gap: 11 (ref 5–15)
BUN: 8 mg/dL (ref 6–20)
CO2: 22 mmol/L (ref 22–32)
Calcium: 8.9 mg/dL (ref 8.9–10.3)
Chloride: 96 mmol/L — ABNORMAL LOW (ref 101–111)
Creatinine, Ser: 0.66 mg/dL (ref 0.44–1.00)
GFR calc Af Amer: >60 mL/min (ref >60)
GFR calc non Af Amer: >60 mL/min (ref >60)
Glucose, Bld: 189 mg/dL — ABNORMAL HIGH (ref 65–99)
Potassium: 3.4 mmol/L — ABNORMAL LOW (ref 3.5–5.1)
Sodium: 129 mmol/L — ABNORMAL LOW (ref 135–145)
Total Bilirubin: 1 mg/dL (ref 0.3–1.2)
Total Protein: 6.3 g/dL — ABNORMAL LOW (ref 6.5–8.1)

## 2016-04-30 LAB — HEPATITIS C ANTIBODY: HCV Ab: <0.1 s/co ratio (ref 0.0–0.9)

## 2016-04-30 LAB — HEPATITIS B CORE ANTIBODY, TOTAL: Hep B Core Total Ab: NEGATIVE

## 2016-04-30 MED ORDER — ONDANSETRON HCL 8 MG PO TABS: 8.0000 mg | ORAL_TABLET | Freq: Two times a day (BID) | ORAL | Status: DC | PRN

## 2016-04-30 MED ORDER — SODIUM CHLORIDE 0.9 % IV SOLN
Freq: Once | INTRAVENOUS | Status: AC
  Administered 2016-04-30: 12:00:00 via INTRAVENOUS
  Filled 2016-04-30: qty 1000

## 2016-04-30 MED ORDER — SODIUM CHLORIDE 0.9 % IV SOLN
493.8000 mg | Freq: Once | INTRAVENOUS | Status: AC
  Administered 2016-04-30: 490 mg via INTRAVENOUS
  Filled 2016-04-30: qty 49

## 2016-04-30 MED ORDER — SODIUM CHLORIDE 0.9 % IV SOLN
10.0000 mg | Freq: Once | INTRAVENOUS | Status: AC
  Administered 2016-04-30: 10 mg via INTRAVENOUS
  Filled 2016-04-30: qty 1

## 2016-04-30 MED ORDER — HEPARIN SOD (PORK) LOCK FLUSH 100 UNIT/ML IV SOLN
500.0000 [IU] | Freq: Once | INTRAVENOUS | Status: AC
  Administered 2016-04-30: 500 [IU] via INTRAVENOUS
  Filled 2016-04-30: qty 5

## 2016-04-30 MED ORDER — PACLITAXEL PROTEIN-BOUND CHEMO INJECTION 100 MG
100.0000 mg/m2 | Freq: Once | INTRAVENOUS | Status: AC
  Administered 2016-04-30: 150 mg via INTRAVENOUS
  Filled 2016-04-30: qty 30

## 2016-04-30 MED ORDER — SODIUM CHLORIDE 0.9% FLUSH
10.0000 mL | Freq: Once | INTRAVENOUS | Status: AC
  Administered 2016-04-30: 10 mL via INTRAVENOUS
  Filled 2016-04-30: qty 10

## 2016-04-30 MED ORDER — PALONOSETRON HCL INJECTION 0.25 MG/5ML
0.2500 mg | Freq: Once | INTRAVENOUS | Status: AC
  Administered 2016-04-30: 0.25 mg via INTRAVENOUS
  Filled 2016-04-30: qty 5

## 2016-04-30 NOTE — Progress Notes (Signed)
[facility] day:  04/30/2016  Chief Complaint: Christina Mckee is a 55 y.o. female with metastatic squamous cell lung cancer who is seen for assessment prior to day 1 of cycle #1 carboplatin and Abraxane.  HPI: The patient was last seen in the medical oncology clinic on 04/29/2016.  At that time, chemotherapy was postponed for several reasons.  She was tachycardic (pulse 135) with a fever.  Liver function tests were elevated. She admitted to drinking alcohol the day before.  Alkaline phosphatase was also elevated.    A fever work-up was performed.  CXR revealed no evidence of pneumonia.  Urinalysis was negative (culture pending).  Blood cultures were drawn.  EKG revealed sinus tachycardia with no change from baseline.  RUQ ultrasound revealed no gallstones or biliary ductal dilatation.  She had hepatomegaly with a 3.6 cm solid lesion in the left hepatic lobe.  Yesterday, she complained of port discomfort following placement on 04/28/2016.  She had a chronic cough (no change).  Her sodium and potassium were low.  She was started on oral potassium.  Fluids with electrolytes were discussed.  Symptomatically, she notes ongoing discomfort s/p port placement.  She is using ibuprofen for pain relief.  She is having trouble sleeping.  She has a chronic cough.  She denies any fever.   Past Medical History  Diagnosis Date  . DVT (deep venous thrombosis) ([place])     in 2015  . Menopause     prior to 2014    Past Surgical History  Procedure Laterality Date  . Cesarean section    . Ivc filter placement ([facility] hx)    . Breast cyst excision      left breast removal  . Peripheral vascular catheterization N/A 04/28/2016    Procedure: Glori Luis Cath Insertion;  Surgeon: Algernon Huxley, MD;  Location: [facility];  Service: Cardiovascular;  Laterality: N/A;    Family History  Problem Relation Age of Onset  . Breast cancer Mother   . Breast cancer Maternal  Aunt   . Stomach cancer Paternal Uncle   . Leukemia Paternal Uncle     Social History:  reports that she has been smoking Cigarettes.  She has been smoking about 0.25 packs per day. She has never used smokeless tobacco. She reports that she drinks alcohol. She reports that she does not use illicit drugs.  She has smoked 1/2-1 pack a day since age 58. She has a 30-40 pack year smoking history.  She drinks 4 beers a day. She lives in [place] alone with her cats and dogs. She previously worked at [employer], but notes all positions were recently eliminated. The patient is alone today.  Allergies:  Allergies  Allergen Reactions  . Penicillins Hives and Other (See Comments)    Has patient had a PCN reaction causing immediate rash, facial/tongue/throat swelling, SOB or lightheadedness with hypotension: No Has patient had a PCN reaction causing severe rash involving mucus membranes or skin necrosis: No Has patient had a PCN reaction that required hospitalization No Has patient had a PCN reaction occurring within the last 10 years: No If all of the above answers are "NO", then may proceed with Cephalosporin use.    Current Medications: Current Outpatient Prescriptions  Medication Sig Dispense Refill  . benzonatate (TESSALON) 200 MG capsule Take by mouth.    . fluticasone (FLONASE) 50 MCG/ACT nasal spray Place 1 spray into both nostrils daily as needed for allergies or  rhinitis.    Marland Kitchen guaifenesin (ROBITUSSIN) 100 MG/5ML syrup Take 200 mg by mouth 3 (three) times daily as needed for cough or congestion.    Marland Kitchen ibuprofen (ADVIL,MOTRIN) 200 MG tablet Take by mouth.    . lidocaine-prilocaine (EMLA) cream Apply 1 application topically as needed. 30 g 6  . metoprolol tartrate (LOPRESSOR) 25 MG tablet Take 1 tablet (25 mg total) by mouth 2 (two) times daily. 60 tablet 2  . nicotine (NICODERM CQ - DOSED IN MG/24 HOURS) 21 mg/24hr patch Place 1 patch (21 mg total) onto the skin daily. 28 patch 0  .  ondansetron (ZOFRAN) 8 MG tablet Take 1 tablet (8 mg total) by mouth 2 (two) times daily as needed for refractory nausea / vomiting. Start on day 3 after carboplatin chemo. 30 tablet 1  . potassium chloride (K-DUR) 10 MEQ tablet Take 1 tablet (10 mEq total) by mouth 2 (two) times daily. For 1 week 20 tablet 0  . prochlorperazine (COMPAZINE) 10 MG tablet Take 1 tablet (10 mg total) by mouth every 6 (six) hours as needed for nausea or vomiting. 30 tablet 6   No current facility-administered medications for this visit.   Facility-Administered Medications Ordered in Other Visits  Medication Dose Route Frequency Provider Last Rate Last Dose  . heparin lock flush 100 unit/mL  500 Units Intravenous Once Lequita Asal, MD      . sodium chloride flush (NS) 0.9 % injection 10 mL  10 mL Intravenous PRN Lequita Asal, MD   10 mL at 04/29/16 0355    Review of Systems:  GENERAL: Fatigue. No fevers or sweats.  Weight stable. PERFORMANCE STATUS (ECOG): 1 HEENT: No visual changes, runny nose, sore throat, mouth sores or tenderness. Lungs: No shortness of breath. Chronic cough s/p Levaquin and azithromycin. Sputum white to clear.  No hemoptysis. Cardiac: No chest pain, palpitations, orthopnea, or PND. GI: Poor oral intake.  Able to eat soft foods.  Food sometimes causes nausea.  No vomiting, diarrhea, constipation, melena or hematochezia. GU: No urgency, frequency, dysuria, or hematuria. Musculoskeletal: No back pain. No joint pain. No muscle tenderness. Extremities: Chronic left lower extremity edema. No pain. Skin: No rashes or skin changes. Neuro: Short term memory issues.  No headache, numbness or weakness, balance or coordination issues. Endocrine: No diabetes, thyroid issues, hot flashes or night sweats. Psych: No mood changes, depression or anxiety. Pain: Pain associated with recent port-a-cath, relieved with ibuprofen. Review of systems: All other systems reviewed and  found to be negative  Physical Exam: Blood pressure 113/67, pulse 118, temperature 97.9 F (36.6 C), temperature source Tympanic, resp. rate 17, height '5\' 5"'  (1.651 m), weight 103 lb 2.8 oz (46.8 kg), last menstrual period 04/14/2004. GENERAL: Thin woman sitting comfortably in the exam room in no acute distress. MENTAL STATUS: Alert and oriented to person, place and time. HEAD: Shoulder length blonde hair. Normocephalic, atraumatic, face symmetric, no Cushingoid features. EYES: Blue eyes. Pupils equal round and reactive to light and accomodation. No conjunctivitis or scleral icterus. ENT: Oropharynx clear without lesion. Tongue normal. Mucous membranes moist.  RESPIRATORY: Clear to auscultation without rales, wheezes or rhonchi. CARDIOVASCULAR: Regular rate and rhythm without murmur, rub or gallop. CHEST  WALL:  Port-a-cath site unremarkable. ABDOMEN: Soft, non-tender, with active bowel sounds, and no hepatosplenomegaly. No masses. SKIN: Spider angiomas on chest.  No rashes, ulcers or lesions. EXTREMITIES: Slight left lower extremity edema. No skin discoloration or tenderness. No palpable cords. LYMPH NODES: No palpable cervical, supraclavicular,  axillary or inguinal adenopathy  NEUROLOGICAL: Appropriate. PSYCH:  Appropriate.   Infusion on 04/30/2016  Component Date Value Ref Range Status  . WBC 04/30/2016 32.6* 3.6 - 11.0 K/uL Final  . RBC 04/30/2016 2.91* 3.80 - 5.20 MIL/uL Final  . Hemoglobin 04/30/2016 9.0* 12.0 - 16.0 g/dL Final  . HCT 04/30/2016 26.6* 35.0 - 47.0 % Final  . MCV 04/30/2016 91.4  80.0 - 100.0 fL Final  . MCH 04/30/2016 30.8  26.0 - 34.0 pg Final  . MCHC 04/30/2016 33.7  32.0 - 36.0 g/dL Final  . RDW 04/30/2016 14.7* 11.5 - 14.5 % Final  . Platelets 04/30/2016 445* 150 - 440 K/uL Final  . Neutrophils Relative % 04/30/2016 92   Final  . Neutro Abs 04/30/2016 30.1* 1.4 - 6.5 K/uL Final  . Lymphocytes Relative 04/30/2016 2   Final  . Lymphs Abs  04/30/2016 0.7* 1.0 - 3.6 K/uL Final  . Monocytes Relative 04/30/2016 5   Final  . Monocytes Absolute 04/30/2016 1.6* 0.2 - 0.9 K/uL Final  . Eosinophils Relative 04/30/2016 0   Final  . Eosinophils Absolute 04/30/2016 0.0  0 - 0.7 K/uL Final  . Basophils Relative 04/30/2016 1   Final  . Basophils Absolute 04/30/2016 0.2* 0 - 0.1 K/uL Final  . Sodium 04/30/2016 129* 135 - 145 mmol/L Final  . Potassium 04/30/2016 3.4* 3.5 - 5.1 mmol/L Final  . Chloride 04/30/2016 96* 101 - 111 mmol/L Final  . CO2 04/30/2016 22  22 - 32 mmol/L Final  . Glucose, Bld 04/30/2016 189* 65 - 99 mg/dL Final  . BUN 04/30/2016 8  6 - 20 mg/dL Final  . Creatinine, Ser 04/30/2016 0.66  0.44 - 1.00 mg/dL Final  . Calcium 04/30/2016 8.9  8.9 - 10.3 mg/dL Final  . Total Protein 04/30/2016 6.3* 6.5 - 8.1 g/dL Final  . Albumin 04/30/2016 2.5* 3.5 - 5.0 g/dL Final  . AST 04/30/2016 76* 15 - 41 U/L Final  . ALT 04/30/2016 100* 14 - 54 U/L Final  . Alkaline Phosphatase 04/30/2016 172* 38 - 126 U/L Final  . Total Bilirubin 04/30/2016 1.0  0.3 - 1.2 mg/dL Final  . GFR calc non Af Amer 04/30/2016 >60  >60 mL/min Final  . GFR calc Af Amer 04/30/2016 >60  >60 mL/min Final   Comment: (NOTE) The eGFR has been calculated using the CKD EPI equation. This calculation has not been validated in all clinical situations. eGFR's persistently <60 mL/min signify possible Chronic Kidney Disease.   . Anion gap 04/30/2016 11  5 - 15 Final  Clinical Support on 04/29/2016  Component Date Value Ref Range Status  . WBC 04/29/2016 29.0* 3.6 - 11.0 K/uL Final  . RBC 04/29/2016 3.06* 3.80 - 5.20 MIL/uL Final  . Hemoglobin 04/29/2016 9.3* 12.0 - 16.0 g/dL Final  . HCT 04/29/2016 28.0* 35.0 - 47.0 % Final  . MCV 04/29/2016 91.6  80.0 - 100.0 fL Final  . MCH 04/29/2016 30.4  26.0 - 34.0 pg Final  . MCHC 04/29/2016 33.2  32.0 - 36.0 g/dL Final  . RDW 04/29/2016 14.7* 11.5 - 14.5 % Final  . Platelets 04/29/2016 494* 150 - 440 K/uL Final  .  Neutrophils Relative % 04/29/2016 92   Final  . Neutro Abs 04/29/2016 26.6* 1.4 - 6.5 K/uL Final  . Lymphocytes Relative 04/29/2016 2   Final  . Lymphs Abs 04/29/2016 0.7* 1.0 - 3.6 K/uL Final  . Monocytes Relative 04/29/2016 6   Final  . Monocytes Absolute 04/29/2016 1.7*  0.2 - 0.9 K/uL Final  . Eosinophils Relative 04/29/2016 0   Final  . Eosinophils Absolute 04/29/2016 0.0  0 - 0.7 K/uL Final  . Basophils Relative 04/29/2016 0   Final  . Basophils Absolute 04/29/2016 0.1  0 - 0.1 K/uL Final  . Sodium 04/29/2016 130* 135 - 145 mmol/L Final  . Potassium 04/29/2016 3.3* 3.5 - 5.1 mmol/L Final  . Chloride 04/29/2016 96* 101 - 111 mmol/L Final  . CO2 04/29/2016 26  22 - 32 mmol/L Final  . Glucose, Bld 04/29/2016 199* 65 - 99 mg/dL Final  . BUN 04/29/2016 7  6 - 20 mg/dL Final  . Creatinine, Ser 04/29/2016 0.67  0.44 - 1.00 mg/dL Final  . Calcium 04/29/2016 9.1  8.9 - 10.3 mg/dL Final  . Total Protein 04/29/2016 6.2* 6.5 - 8.1 g/dL Final  . Albumin 04/29/2016 2.5* 3.5 - 5.0 g/dL Final  . AST 04/29/2016 164* 15 - 41 U/L Final  . ALT 04/29/2016 124* 14 - 54 U/L Final  . Alkaline Phosphatase 04/29/2016 191* 38 - 126 U/L Final  . Total Bilirubin 04/29/2016 0.9  0.3 - 1.2 mg/dL Final  . GFR calc non Af Amer 04/29/2016 >60  >60 mL/min Final  . GFR calc Af Amer 04/29/2016 >60  >60 mL/min Final   Comment: (NOTE) The eGFR has been calculated using the CKD EPI equation. This calculation has not been validated in all clinical situations. eGFR's persistently <60 mL/min signify possible Chronic Kidney Disease.   . Anion gap 04/29/2016 8  5 - 15 Final  . HCV Ab 04/29/2016 <0.1  0.0 - 0.9 s/co ratio Final   Comment: (NOTE)                                  Negative:     < 0.8                             Indeterminate: 0.8 - 0.9                                  Positive:     > 0.9 The CDC recommends that a positive HCV antibody result be followed up with a HCV Nucleic Acid Amplification test  (003704). Performed At: [facility] [place], [place] [id number] Lindon Romp MD [id number]   . Color, Urine 04/29/2016 YELLOW* YELLOW Final  . APPearance 04/29/2016 CLEAR* CLEAR Final  . Glucose, UA 04/29/2016 NEGATIVE  NEGATIVE mg/dL Final  . Bilirubin Urine 04/29/2016 NEGATIVE  NEGATIVE Final  . Ketones, ur 04/29/2016 NEGATIVE  NEGATIVE mg/dL Final  . Specific Gravity, Urine 04/29/2016 1.006  1.005 - 1.030 Final  . Hgb urine dipstick 04/29/2016 1+* NEGATIVE Final  . pH 04/29/2016 6.0  5.0 - 8.0 Final  . Protein, ur 04/29/2016 30* NEGATIVE mg/dL Final  . Nitrite 04/29/2016 NEGATIVE  NEGATIVE Final  . Leukocytes, UA 04/29/2016 NEGATIVE  NEGATIVE Final  . RBC / HPF 04/29/2016 0-5  0 - 5 RBC/hpf Final  . WBC, UA 04/29/2016 0-5  0 - 5 WBC/hpf Final  . Bacteria, UA 04/29/2016 RARE* NONE SEEN Final  . Squamous Epithelial / LPF 04/29/2016 0-5* NONE SEEN Final  . Specimen Description 04/29/2016 BLOOD LEFT WRIST   Final  . Special Requests 04/29/2016 BOTTLES DRAWN AEROBIC AND  ANAEROBIC 6 CC   Final  . Culture 04/29/2016 NO GROWTH < 24 HOURS   Final  . Report Status 04/29/2016 PENDING   Incomplete  . Specimen Description 04/29/2016 BLOOD LEFT ARM   Final  . Special Requests 04/29/2016 BOTTLES DRAWN AEROBIC AND ANAEROBIC 7 CC   Final  . Culture 04/29/2016 NO GROWTH < 24 HOURS   Final  . Report Status 04/29/2016 PENDING   Incomplete  . Hep B Core Total Ab 04/29/2016 Negative  Negative Final   Comment: (NOTE) Performed At: [facility] [place], [place] [id number] Lindon Romp MD [id number]   Admission on 04/28/2016, Discharged on 04/28/2016  Component Date Value Ref Range Status  . Glucose-Capillary 04/28/2016 142* 65 - 99 mg/dL Final    Assessment:  Christina Mckee is a 55 y.o. female with metastatic squamous cell lung cancer. Molecular studies were negative studies for EGFR, ALK, and ROS1. PD-L1 was 1%.  Chest CT angiogram  on 03/05/2016 revealed 8.7 x 5.7 x 3.3 cm mass in the medial aspect of the superior segment of the left lower lobe extending into the medial aspect of the left upper lobe and into the left hilum. There was vascular and endobronchial encasement and narrowing. There were multiple small left lung nodules. Abdomen and pelvic CT scan revealed diffuse hepatic steatosis and no evidence of metastatic disease in the abdomen or pelvis.  CT guided left lower lobe biopsy on 03/29/2016 revealed moderately differentiated squamous cell carcinoma. CEA and LDH were normal on 04/05/2016.  PET scan on 04/14/2016 revealed hypermetabolic left lower lobe mass with hypermetabolic left hilar adenopathy and left hepatic lobe lesion. Findings were consistent with stage IV primary bronchogenic carcinoma. There were mildly hypermetabolic subcarinal lymph node versus mild esophageal uptake. There was hepatic steatosis. Head CT on 04/15/2016 was normal.  She has a history of an extensive left lower extremity thromboses in 12/2012. Because of the extensive clot burden, she underwent TPA with angioplasty on 12/28/2012. An IVC filter was placed on 12/29/2012 and still remains. Hypercoagulable workup was negative for Factor V Leiden, prothrombin gene mutation, lupus anticoagulant, and antithrombin III. She was on Xarelto for 6 months.  Fever work-up on 04/29/2016 is negative to date.  RUQ ultrasound on 04/29/2016 revealed no gallstones or biliary ductal dilatation.  She had hepatomegaly with a 3.6 cm solid lesion in the left hepatic lobe.  Hepatitis B core antibody and hepatitis C antibody were negative on 04/29/2016.  Symptomatically, she has mild tenderness associated with her newly placed port. Fever has resolved. Cultures are negative to date.  WBC remains elevated. Liver function tests are slightly improved. Alkaline phosphatase remains elevated.  She has hypokalemia on oral potassium.  Plan: 1.  Review extensive work-up  from yesterday.  Cultures negative to date.  No evidence of infection.  CXR stable.  RUQ ultrasound with no biliary ductal dilatation, only known liver lesion. 2.  Discuss chemotherapy plan.  Patient consented to treatment. 3.  Labs today:  CBC with diff, CMP. 4.  Day 1 of cycle #1 carboplatin and Abraxane. 5.  Continue potassium supplementation. 6.  RTC in 1 week for MD assess, labs (CBC with diff, CMP), and day 8 Abraxane.  Addendum:  Alkaline phosphatase is elevated.  Consider bone scan as PET is negative.   Lequita Asal, MD  04/30/2016, 11:02 AM

## 2016-04-30 NOTE — Progress Notes (Signed)
Pt reports she is now taking potassium and took everything in her to eat banana.  Continues with pain in right side of neck related to port taking Ibuprofen to relieve pain. Pt reports she cannot sleep and would like to know if she can take anything.

## 2016-05-04 ENCOUNTER — Ambulatory Visit
Admission: RE | Admit: 2016-05-04 | Discharge: 2016-05-04 | Disposition: A | Discharge status: Home / Self Care | Payer: BLUE CROSS/BLUE SHIELD | Source: Ambulatory Visit | Attending: Hematology and Oncology | Admitting: Hematology and Oncology

## 2016-05-04 DIAGNOSIS — R748 Abnormal levels of other serum enzymes: Secondary | ICD-10-CM | POA: Diagnosis present

## 2016-05-04 DIAGNOSIS — C7800 Secondary malignant neoplasm of unspecified lung: Secondary | ICD-10-CM | POA: Insufficient documentation

## 2016-05-04 LAB — CULTURE, BLOOD (ROUTINE X 2)
Culture: NO GROWTH
Culture: NO GROWTH

## 2016-05-04 MED ORDER — TECHNETIUM TC 99M MEDRONATE IV KIT
22.3500 | PACK | Freq: Once | INTRAVENOUS | Status: AC | PRN
  Administered 2016-05-04: 22.35 via INTRAVENOUS

## 2016-05-05 ENCOUNTER — Telehealth: Payer: Self-pay | Admitting: *Deleted

## 2016-05-05 NOTE — Telephone Encounter (Signed)
Called pt and left message . Pt had originally got Friday appt for 6/15, and 6/22.  Since she did not get started til 6/9 due to her fever and she had to be ruled out for infection her next infusion should be 6/16.  Dr. Mike Gip always wants to see pt a week after they get their first treatment and if she can come at 1:30 on Friday . Could she call me back to let me know she can come and that if she ahs any questions

## 2016-05-06 ENCOUNTER — Inpatient Hospital Stay: Payer: BLUE CROSS/BLUE SHIELD

## 2016-05-07 ENCOUNTER — Other Ambulatory Visit: Payer: Self-pay | Admitting: Hematology and Oncology

## 2016-05-07 ENCOUNTER — Inpatient Hospital Stay: Payer: BLUE CROSS/BLUE SHIELD

## 2016-05-07 ENCOUNTER — Encounter: Payer: Self-pay | Admitting: Hematology and Oncology

## 2016-05-07 ENCOUNTER — Inpatient Hospital Stay (HOSPITAL_BASED_OUTPATIENT_CLINIC_OR_DEPARTMENT_OTHER): Payer: BLUE CROSS/BLUE SHIELD | Admitting: Hematology and Oncology

## 2016-05-07 VITALS — BP 109/70 | HR 101 | Temp 96.9°F | Resp 18 | Ht 65.0 in | Wt 105.4 lb

## 2016-05-07 DIAGNOSIS — D6481 Anemia due to antineoplastic chemotherapy: Secondary | ICD-10-CM

## 2016-05-07 DIAGNOSIS — K760 Fatty (change of) liver, not elsewhere classified: Secondary | ICD-10-CM | POA: Diagnosis not present

## 2016-05-07 DIAGNOSIS — E876 Hypokalemia: Secondary | ICD-10-CM | POA: Diagnosis not present

## 2016-05-07 DIAGNOSIS — E871 Hypo-osmolality and hyponatremia: Secondary | ICD-10-CM

## 2016-05-07 DIAGNOSIS — C3492 Malignant neoplasm of unspecified part of left bronchus or lung: Secondary | ICD-10-CM

## 2016-05-07 DIAGNOSIS — T451X5A Adverse effect of antineoplastic and immunosuppressive drugs, initial encounter: Secondary | ICD-10-CM

## 2016-05-07 DIAGNOSIS — Z86718 Personal history of other venous thrombosis and embolism: Secondary | ICD-10-CM

## 2016-05-07 DIAGNOSIS — C3432 Malignant neoplasm of lower lobe, left bronchus or lung: Secondary | ICD-10-CM

## 2016-05-07 DIAGNOSIS — Z79899 Other long term (current) drug therapy: Secondary | ICD-10-CM

## 2016-05-07 DIAGNOSIS — C787 Secondary malignant neoplasm of liver and intrahepatic bile duct: Secondary | ICD-10-CM

## 2016-05-07 DIAGNOSIS — D649 Anemia, unspecified: Secondary | ICD-10-CM

## 2016-05-07 DIAGNOSIS — E46 Unspecified protein-calorie malnutrition: Secondary | ICD-10-CM

## 2016-05-07 DIAGNOSIS — F17210 Nicotine dependence, cigarettes, uncomplicated: Secondary | ICD-10-CM

## 2016-05-07 LAB — COMPREHENSIVE METABOLIC PANEL
ALT: 64 U/L — ABNORMAL HIGH (ref 14–54)
AST: 60 U/L — ABNORMAL HIGH (ref 15–41)
Albumin: 2.3 g/dL — ABNORMAL LOW (ref 3.5–5.0)
Alkaline Phosphatase: 137 U/L — ABNORMAL HIGH (ref 38–126)
Anion gap: 8 (ref 5–15)
BUN: 12 mg/dL (ref 6–20)
CO2: 26 mmol/L (ref 22–32)
Calcium: 8.4 mg/dL — ABNORMAL LOW (ref 8.9–10.3)
Chloride: 97 mmol/L — ABNORMAL LOW (ref 101–111)
Creatinine, Ser: 0.51 mg/dL (ref 0.44–1.00)
GFR calc Af Amer: >60 mL/min (ref >60)
GFR calc non Af Amer: >60 mL/min (ref >60)
Glucose, Bld: 144 mg/dL — ABNORMAL HIGH (ref 65–99)
Potassium: 3 mmol/L — ABNORMAL LOW (ref 3.5–5.1)
Sodium: 131 mmol/L — ABNORMAL LOW (ref 135–145)
Total Bilirubin: 0.7 mg/dL (ref 0.3–1.2)
Total Protein: 5.4 g/dL — ABNORMAL LOW (ref 6.5–8.1)

## 2016-05-07 LAB — CBC WITH DIFFERENTIAL/PLATELET
Basophils Absolute: 0.1 10*3/uL (ref 0–0.1)
Basophils Relative: 1 %
Eosinophils Absolute: 0 10*3/uL (ref 0–0.7)
Eosinophils Relative: 0 %
HCT: 21.9 % — ABNORMAL LOW (ref 35.0–47.0)
Hemoglobin: 7.3 g/dL — ABNORMAL LOW (ref 12.0–16.0)
Lymphocytes Relative: 14 %
Lymphs Abs: 1.5 10*3/uL (ref 1.0–3.6)
MCH: 30.5 pg (ref 26.0–34.0)
MCHC: 33.2 g/dL (ref 32.0–36.0)
MCV: 91.8 fL (ref 80.0–100.0)
Monocytes Absolute: 1.6 10*3/uL — ABNORMAL HIGH (ref 0.2–0.9)
Monocytes Relative: 15 %
Neutro Abs: 7.5 10*3/uL — ABNORMAL HIGH (ref 1.4–6.5)
Neutrophils Relative %: 70 %
Platelets: 138 10*3/uL — ABNORMAL LOW (ref 150–440)
RBC: 2.39 MIL/uL — ABNORMAL LOW (ref 3.80–5.20)
RDW: 14.6 % — ABNORMAL HIGH (ref 11.5–14.5)
WBC: 10.6 10*3/uL (ref 3.6–11.0)

## 2016-05-07 LAB — IRON AND TIBC
Saturation Ratios: UNDETERMINED % (ref 10.4–31.8)
TIBC: 152 ug/dL — AB (ref 250–450)
UIBC: UNDETERMINED ug/dL

## 2016-05-07 LAB — RETICULOCYTES
RBC.: 2.38 MIL/uL — ABNORMAL LOW (ref 3.80–5.20)
RETIC COUNT ABSOLUTE: 16.7 10*3/uL — AB (ref 19.0–183.0)
Retic Ct Pct: 0.7 % (ref 0.4–3.1)

## 2016-05-07 LAB — FERRITIN: Ferritin: 461 ng/mL — ABNORMAL HIGH (ref 11–307)

## 2016-05-07 LAB — TSH: TSH: 5.12 u[IU]/mL — AB (ref 0.350–4.500)

## 2016-05-07 LAB — VITAMIN B12: Vitamin B-12: 1486 pg/mL — ABNORMAL HIGH (ref 180–914)

## 2016-05-07 LAB — FOLATE: Folate: 8.2 ng/mL (ref >5.9)

## 2016-05-07 LAB — SAMPLE TO BLOOD BANK

## 2016-05-07 MED ORDER — PACLITAXEL PROTEIN-BOUND CHEMO INJECTION 100 MG
100.0000 mg/m2 | Freq: Once | INTRAVENOUS | Status: AC
  Administered 2016-05-07: 150 mg via INTRAVENOUS
  Filled 2016-05-07: qty 30

## 2016-05-07 MED ORDER — HEPARIN SOD (PORK) LOCK FLUSH 100 UNIT/ML IV SOLN
500.0000 [IU] | Freq: Once | INTRAVENOUS | Status: AC | PRN
  Administered 2016-05-07: 500 [IU]
  Filled 2016-05-07: qty 5

## 2016-05-07 MED ORDER — POTASSIUM CHLORIDE ER 10 MEQ PO TBCR: 20.0000 meq | EXTENDED_RELEASE_TABLET | Freq: Two times a day (BID) | ORAL | Status: DC

## 2016-05-07 MED ORDER — SODIUM CHLORIDE 0.9 % IV SOLN
INTRAVENOUS | Status: DC
  Administered 2016-05-07: 15:00:00 via INTRAVENOUS
  Filled 2016-05-07: qty 100

## 2016-05-07 MED ORDER — SODIUM CHLORIDE 0.9% FLUSH
10.0000 mL | INTRAVENOUS | Status: DC | PRN
  Administered 2016-05-07: 10 mL
  Filled 2016-05-07: qty 10

## 2016-05-07 MED ORDER — SODIUM CHLORIDE 0.9 % IV SOLN
Freq: Once | INTRAVENOUS | Status: AC
  Administered 2016-05-07: 16:00:00 via INTRAVENOUS
  Filled 2016-05-07: qty 1000

## 2016-05-07 MED ORDER — SODIUM CHLORIDE 0.9 % IV SOLN
Freq: Once | INTRAVENOUS | Status: AC
  Administered 2016-05-07: 16:00:00 via INTRAVENOUS
  Filled 2016-05-07: qty 4

## 2016-05-07 NOTE — Progress Notes (Signed)
Pt here for treatment today. She would like her chemo trobe changed to Thursday for her work sch.  She has left ear ache that she has had for a while but would like md to check it. She cont. To have cough and clear sputum. She feels like she is weak but now has inc. Calories to 1200 calories a day and her boss is letting her eat and drink at her desk. She has bilateral leg pain at below knee to feet and she noticed after chemo and she takes ibuprofen for it and it helps some

## 2016-05-07 NOTE — Progress Notes (Signed)
[facility] day:  05/07/2016  Chief Complaint: Christina Mckee is a 55 y.o. female with metastatic squamous cell lung cancer who is seen for assessment prior to day 8 of cycle #1 carboplatin and Abraxane.  HPI: The patient was last seen in the medical oncology clinic on 04/30/2016.  At that time, she received day 1 of cycle #1 carboplatin and Abraxane.  At that time, she had a chronic cough and ongoing discomfort s/p port-a-cath placement.  She tolerated her chemotherapy well.   She experience leg discomfort post chemotherapy.  She denies any increased lower extremity swelling.  She states that she still has a cough.  She feels cold.  She has had some night sweats.  She denies any fever.  She has a left sided earache.  Her thumb felt numb.     Past Medical History  Diagnosis Date  . DVT (deep venous thrombosis) ([place])     in 2015  . Menopause     prior to 2014    Past Surgical History  Procedure Laterality Date  . Cesarean section    . Ivc filter placement ([facility] hx)    . Breast cyst excision      left breast removal  . Peripheral vascular catheterization N/A 04/28/2016    Procedure: Glori Luis Cath Insertion;  Surgeon: Algernon Huxley, MD;  Location: [facility];  Service: Cardiovascular;  Laterality: N/A;    Family History  Problem Relation Age of Onset  . Breast cancer Mother   . Breast cancer Maternal Aunt   . Stomach cancer Paternal Uncle   . Leukemia Paternal Uncle     Social History:  reports that she has been smoking Cigarettes.  She has been smoking about 0.25 packs per day. She has never used smokeless tobacco. She reports that she drinks alcohol. She reports that she does not use illicit drugs.  She has smoked 1/2-1 pack a day since age 53. She has a 30-40 pack year smoking history.  She drinks 4 beers a day. She lives in [place] alone with her cats and dogs. She previously worked at [employer], but notes all positions were  recently eliminated. Patients wants treatment on Thursdays secondary to work related issues.  The patient is alone today.  Allergies:  Allergies  Allergen Reactions  . Penicillins Hives and Other (See Comments)    Has patient had a PCN reaction causing immediate rash, facial/tongue/throat swelling, SOB or lightheadedness with hypotension: No Has patient had a PCN reaction causing severe rash involving mucus membranes or skin necrosis: No Has patient had a PCN reaction that required hospitalization No Has patient had a PCN reaction occurring within the last 10 years: No If all of the above answers are "NO", then may proceed with Cephalosporin use.    Current Medications: Current Outpatient Prescriptions  Medication Sig Dispense Refill  . guaifenesin (ROBITUSSIN) 100 MG/5ML syrup Take 200 mg by mouth 3 (three) times daily as needed for cough or congestion.    Marland Kitchen ibuprofen (ADVIL,MOTRIN) 200 MG tablet Take by mouth.    . lidocaine-prilocaine (EMLA) cream Apply 1 application topically as needed. 30 g 6  . metoprolol tartrate (LOPRESSOR) 25 MG tablet Take 1 tablet (25 mg total) by mouth 2 (two) times daily. 60 tablet 2  . nicotine (NICODERM CQ - DOSED IN MG/24 HOURS) 21 mg/24hr patch Place 1 patch (21 mg total) onto the skin daily. 28 patch 0  . ondansetron (ZOFRAN) 8  MG tablet Take 1 tablet (8 mg total) by mouth 2 (two) times daily as needed for refractory nausea / vomiting. Start on day 3 after carboplatin chemo. 30 tablet 1  . prochlorperazine (COMPAZINE) 10 MG tablet Take 1 tablet (10 mg total) by mouth every 6 (six) hours as needed for nausea or vomiting. 30 tablet 6  . potassium chloride (K-DUR) 10 MEQ tablet Take 1 tablet (10 mEq total) by mouth 2 (two) times daily. For 1 week 20 tablet 0   No current facility-administered medications for this visit.   Facility-Administered Medications Ordered in Other Visits  Medication Dose Route Frequency Provider Last Rate Last Dose  . sodium  chloride flush (NS) 0.9 % injection 10 mL  10 mL Intravenous PRN Lequita Asal, MD   10 mL at 04/29/16 0906  . sodium chloride flush (NS) 0.9 % injection 10 mL  10 mL Intracatheter PRN Lequita Asal, MD        Review of Systems:  GENERAL: Feels "ok". No fevers.  Some night sweats. Weight ip 2 pounds. PERFORMANCE STATUS (ECOG): 1 HEENT: No visual changes, runny nose, sore throat, mouth sores or tenderness. Lungs: No shortness of breath. Chronic cough.  No hemoptysis. Cardiac: No chest pain, palpitations, orthopnea, or PND. GI: Eating more.  No vomiting, diarrhea, constipation, melena or hematochezia. GU: No urgency, frequency, dysuria, or hematuria. Musculoskeletal: No back pain. No joint pain. No muscle tenderness. Extremities: Chronic left lower extremity edema. No pain. Skin: No rashes or skin changes. Neuro: Short term memory issues.  No headache, numbness or weakness, balance or coordination issues. Endocrine: No diabetes, thyroid issues, hot flashes or night sweats. Psych: No mood changes, depression or anxiety. Pain: No focal pain. Review of systems: All other systems reviewed and found to be negative  Physical Exam: Blood pressure 109/70, pulse 101, temperature 96.9 F (36.1 C), temperature source Tympanic, resp. rate 18, height '5\' 5"'  (1.651 m), weight 105 lb 6.1 oz (47.8 kg), last menstrual period 04/14/2004. GENERAL: Thin woman sitting comfortably in the exam room in no acute distress. MENTAL STATUS: Alert and oriented to person, place and time. HEAD: Shoulder length hair. Normocephalic, atraumatic, face symmetric, no Cushingoid features. EYES: Blue eyes. Pupils equal round and reactive to light and accomodation. No conjunctivitis or scleral icterus. ENT: Oropharynx clear without lesion. Tongue normal. Mucous membranes moist.  Tympanic membranes are benign. RESPIRATORY: Clear to auscultation without rales, wheezes or  rhonchi. CARDIOVASCULAR: Regular rate and rhythm without murmur, rub or gallop. ABDOMEN: Soft, non-tender, with active bowel sounds, and no hepatosplenomegaly. No masses. SKIN: Spider angiomas on chest.  No rashes, ulcers or lesions. EXTREMITIES: Slight left lower extremity edema (stable). No skin discoloration or tenderness. No palpable cords. LYMPH NODES: No palpable cervical, supraclavicular, axillary or inguinal adenopathy  NEUROLOGICAL: Appropriate. PSYCH:  Appropriate.   Appointment on 05/07/2016  Component Date Value Ref Range Status  . WBC 05/07/2016 10.6  3.6 - 11.0 K/uL Final  . RBC 05/07/2016 2.39* 3.80 - 5.20 MIL/uL Final  . Hemoglobin 05/07/2016 7.3* 12.0 - 16.0 g/dL Final  . HCT 05/07/2016 21.9* 35.0 - 47.0 % Final  . MCV 05/07/2016 91.8  80.0 - 100.0 fL Final  . MCH 05/07/2016 30.5  26.0 - 34.0 pg Final  . MCHC 05/07/2016 33.2  32.0 - 36.0 g/dL Final  . RDW 05/07/2016 14.6* 11.5 - 14.5 % Final  . Platelets 05/07/2016 138* 150 - 440 K/uL Final  . Neutrophils Relative % 05/07/2016 70   Final  .  Neutro Abs 05/07/2016 7.5* 1.4 - 6.5 K/uL Final  . Lymphocytes Relative 05/07/2016 14   Final  . Lymphs Abs 05/07/2016 1.5  1.0 - 3.6 K/uL Final  . Monocytes Relative 05/07/2016 15   Final  . Monocytes Absolute 05/07/2016 1.6* 0.2 - 0.9 K/uL Final  . Eosinophils Relative 05/07/2016 0   Final  . Eosinophils Absolute 05/07/2016 0.0  0 - 0.7 K/uL Final  . Basophils Relative 05/07/2016 1   Final  . Basophils Absolute 05/07/2016 0.1  0 - 0.1 K/uL Final  . Sodium 05/07/2016 131* 135 - 145 mmol/L Final  . Potassium 05/07/2016 3.0* 3.5 - 5.1 mmol/L Final  . Chloride 05/07/2016 97* 101 - 111 mmol/L Final  . CO2 05/07/2016 26  22 - 32 mmol/L Final  . Glucose, Bld 05/07/2016 144* 65 - 99 mg/dL Final  . BUN 05/07/2016 12  6 - 20 mg/dL Final  . Creatinine, Ser 05/07/2016 0.51  0.44 - 1.00 mg/dL Final  . Calcium 05/07/2016 8.4* 8.9 - 10.3 mg/dL Final  . Total Protein 05/07/2016 5.4*  6.5 - 8.1 g/dL Final  . Albumin 05/07/2016 2.3* 3.5 - 5.0 g/dL Final  . AST 05/07/2016 60* 15 - 41 U/L Final  . ALT 05/07/2016 64* 14 - 54 U/L Final  . Alkaline Phosphatase 05/07/2016 137* 38 - 126 U/L Final  . Total Bilirubin 05/07/2016 0.7  0.3 - 1.2 mg/dL Final  . GFR calc non Af Amer 05/07/2016 >60  >60 mL/min Final  . GFR calc Af Amer 05/07/2016 >60  >60 mL/min Final   Comment: (NOTE) The eGFR has been calculated using the CKD EPI equation. This calculation has not been validated in all clinical situations. eGFR's persistently <60 mL/min signify possible Chronic Kidney Disease.   . Anion gap 05/07/2016 8  5 - 15 Final    Assessment:  BREANDA GREENLAW is a 55 y.o. female with metastatic squamous cell lung cancer. Molecular studies were negative studies for EGFR, ALK, and ROS1. PD-L1 was 1%.  Chest CT angiogram on 03/05/2016 revealed 8.7 x 5.7 x 3.3 cm mass in the medial aspect of the superior segment of the left lower lobe extending into the medial aspect of the left upper lobe and into the left hilum. There was vascular and endobronchial encasement and narrowing. There were multiple small left lung nodules. Abdomen and pelvic CT scan revealed diffuse hepatic steatosis and no evidence of metastatic disease in the abdomen or pelvis.  CT guided left lower lobe biopsy on 03/29/2016 revealed moderately differentiated squamous cell carcinoma. CEA and LDH were normal on 04/05/2016.  PET scan on 04/14/2016 revealed hypermetabolic left lower lobe mass with hypermetabolic left hilar adenopathy and left hepatic lobe lesion. Findings were consistent with stage IV primary bronchogenic carcinoma. There were mildly hypermetabolic subcarinal lymph node versus mild esophageal uptake. There was hepatic steatosis. Head CT on 04/15/2016 was normal.  She has a history of an extensive left lower extremity thromboses in 12/2012. Because of the extensive clot burden, she underwent TPA with angioplasty on  12/28/2012. An IVC filter was placed on 12/29/2012 and still remains. Hypercoagulable workup was negative for Factor V Leiden, prothrombin gene mutation, lupus anticoagulant, and antithrombin III. She was on Xarelto for 6 months.  Fever work-up on 04/29/2016 was negative. RUQ ultrasound on 04/29/2016 revealed no gallstones or biliary ductal dilatation. She had hepatomegaly with a 3.6 cm solid lesion in the left hepatic lobe. Hepatitis B core antibody and hepatitis C antibody were negative on 04/29/2016.  She is day 8 of cycle #1 carboplatin and Abraxane (04/30/2016).  WBC has dropped dramatically, but remains normal.  She has a normocytic anemia.  Symptomatically, she has a chronic cough.  She has had some achiness post chemotherapy.  LFTs are improving.  Sodium is low (131).  Potassium is low (3.0).  Plan: 1.  Labs today:  CBC with diff, CMP, ferritin, iron studies, B12, folate, TSH, hold tube. 2.  Discuss progressive anemia and planned work-up.  Discuss potential need for transfusion.  Patient does not want transfusions. 3.  Discuss low albumen and caloric intake.  Discuss fluids with electrolytes.  Discuss iron rich foods. 4.  Discuss code status issues.  Patient does "not want heroic measures for a lost cause". 5.  Day 8 of cycle #1 Abraxane today. 6.  Potassium chloride 20 meq IV today. 7,  Refill potassium. 8.  RTC on 05/11/2016 for labs (CBC, hold tube, potassium). 9.  RTC on 05/13/2016 for MD assessment, labs (CBC with diff, CMP), and day 15 Abraxane.   Lequita Asal, MD  05/07/2016, 2:03 PM

## 2016-05-07 NOTE — Progress Notes (Signed)
Hbg 7.3. Proceed with Abraxane per Dr Mike Gip

## 2016-05-07 NOTE — Telephone Encounter (Signed)
Erroneous encounter

## 2016-05-10 ENCOUNTER — Other Ambulatory Visit: Payer: Self-pay | Admitting: Hematology and Oncology

## 2016-05-10 DIAGNOSIS — D649 Anemia, unspecified: Secondary | ICD-10-CM

## 2016-05-11 ENCOUNTER — Telehealth: Payer: Self-pay

## 2016-05-11 ENCOUNTER — Inpatient Hospital Stay: Payer: BLUE CROSS/BLUE SHIELD

## 2016-05-11 DIAGNOSIS — D649 Anemia, unspecified: Secondary | ICD-10-CM

## 2016-05-11 DIAGNOSIS — C3432 Malignant neoplasm of lower lobe, left bronchus or lung: Secondary | ICD-10-CM | POA: Diagnosis not present

## 2016-05-11 LAB — CBC WITH DIFFERENTIAL/PLATELET
Basophils Absolute: 0.1 10*3/uL (ref 0–0.1)
Basophils Relative: 1 %
Eosinophils Absolute: 0.1 10*3/uL (ref 0–0.7)
Eosinophils Relative: 1 %
HCT: 24.4 % — ABNORMAL LOW (ref 35.0–47.0)
Hemoglobin: 8.1 g/dL — ABNORMAL LOW (ref 12.0–16.0)
Lymphocytes Relative: 30 %
Lymphs Abs: 1.3 10*3/uL (ref 1.0–3.6)
MCH: 30.8 pg (ref 26.0–34.0)
MCHC: 33.3 g/dL (ref 32.0–36.0)
MCV: 92.6 fL (ref 80.0–100.0)
Monocytes Absolute: 0.2 10*3/uL (ref 0.2–0.9)
Monocytes Relative: 4 %
Neutro Abs: 2.7 10*3/uL (ref 1.4–6.5)
Neutrophils Relative %: 64 %
Platelets: 175 10*3/uL (ref 150–440)
RBC: 2.63 MIL/uL — ABNORMAL LOW (ref 3.80–5.20)
RDW: 14.9 % — ABNORMAL HIGH (ref 11.5–14.5)
WBC: 4.2 10*3/uL (ref 3.6–11.0)

## 2016-05-11 LAB — SAMPLE TO BLOOD BANK

## 2016-05-11 LAB — POTASSIUM: Potassium: 4.1 mmol/L (ref 3.5–5.1)

## 2016-05-11 LAB — T4, FREE: Free T4: 1.12 ng/dL (ref 0.61–1.12)

## 2016-05-11 LAB — SEDIMENTATION RATE: Sed Rate: 75 mm/hr — ABNORMAL HIGH (ref 0–30)

## 2016-05-11 NOTE — Telephone Encounter (Signed)
Called pt left VM for pt to return call regarding message from Dr. Mike Gip.

## 2016-05-12 ENCOUNTER — Telehealth: Payer: Self-pay | Admitting: Hematology and Oncology

## 2016-05-12 ENCOUNTER — Other Ambulatory Visit: Payer: Self-pay | Admitting: *Deleted

## 2016-05-12 MED ORDER — GUAIFENESIN-CODEINE 100-10 MG/5ML PO SYRP: 5.0000 mL | ORAL_SOLUTION | Freq: Three times a day (TID) | ORAL | Status: AC | PRN

## 2016-05-12 NOTE — Telephone Encounter (Signed)
Daughter Christina Mckee called back and told me that mom did not hear back from lab results.  I told her that Brandy called yest. But did not hear from her.  The daughter states that she has lost her voice and it happened 2 days ago, she has clear drainage from her nose but sometimes it is thicker and not clear looking. She says that her mother did not tell her she had a fever.  Coughing so bad - she took allergy pills and it did not help. Pt was wanting zpak.  And something for her cough and if any rx given to please send it to [facility].  I told Christina Mckee I will check with corcoran and call her back.

## 2016-05-12 NOTE — Telephone Encounter (Signed)
I called daughter Caryl Pina back and told no on the Zpak but wanted to call a rx for coughing and the pt needs to come to her appt tom. So we can evaluate her.  Also the daughter says that the pt wants rx sent [facility] [address] [facility]  So I called [facility] and [facility] and [facility]  [address] with rx for cheratussin.Daughter will let pt know.

## 2016-05-12 NOTE — Telephone Encounter (Signed)
Patient has been coughing so much that she can no longer talk, throat very dry and sore. They want advice/Rx to handle that and also want to ask some questions about her blood levels and getting chemo. Also the FMLA that was faxed did not include start and end dates and so they need that resent with that info included please. Thanks. Please call Caryl Pina at: [phone]

## 2016-05-13 ENCOUNTER — Other Ambulatory Visit: Payer: Self-pay | Admitting: Hematology and Oncology

## 2016-05-13 ENCOUNTER — Other Ambulatory Visit: Payer: Self-pay | Admitting: *Deleted

## 2016-05-13 ENCOUNTER — Ambulatory Visit: Payer: BLUE CROSS/BLUE SHIELD

## 2016-05-13 ENCOUNTER — Encounter: Payer: Self-pay | Admitting: Hematology and Oncology

## 2016-05-13 ENCOUNTER — Inpatient Hospital Stay (HOSPITAL_BASED_OUTPATIENT_CLINIC_OR_DEPARTMENT_OTHER): Payer: BLUE CROSS/BLUE SHIELD | Admitting: Hematology and Oncology

## 2016-05-13 ENCOUNTER — Inpatient Hospital Stay: Payer: BLUE CROSS/BLUE SHIELD

## 2016-05-13 VITALS — BP 129/79 | HR 103 | Temp 96.9°F | Resp 19 | Ht 65.0 in | Wt 102.0 lb

## 2016-05-13 DIAGNOSIS — K760 Fatty (change of) liver, not elsewhere classified: Secondary | ICD-10-CM

## 2016-05-13 DIAGNOSIS — C787 Secondary malignant neoplasm of liver and intrahepatic bile duct: Secondary | ICD-10-CM

## 2016-05-13 DIAGNOSIS — F17210 Nicotine dependence, cigarettes, uncomplicated: Secondary | ICD-10-CM

## 2016-05-13 DIAGNOSIS — C3492 Malignant neoplasm of unspecified part of left bronchus or lung: Secondary | ICD-10-CM

## 2016-05-13 DIAGNOSIS — C3432 Malignant neoplasm of lower lobe, left bronchus or lung: Secondary | ICD-10-CM

## 2016-05-13 DIAGNOSIS — D649 Anemia, unspecified: Secondary | ICD-10-CM | POA: Diagnosis not present

## 2016-05-13 DIAGNOSIS — Z79899 Other long term (current) drug therapy: Secondary | ICD-10-CM

## 2016-05-13 DIAGNOSIS — E876 Hypokalemia: Secondary | ICD-10-CM | POA: Diagnosis not present

## 2016-05-13 DIAGNOSIS — Z86718 Personal history of other venous thrombosis and embolism: Secondary | ICD-10-CM

## 2016-05-13 LAB — CBC WITH DIFFERENTIAL/PLATELET
Basophils Absolute: 0.1 10*3/uL (ref 0–0.1)
Basophils Relative: 1 %
Eosinophils Absolute: 0 10*3/uL (ref 0–0.7)
Eosinophils Relative: 1 %
HCT: 23.9 % — ABNORMAL LOW (ref 35.0–47.0)
Hemoglobin: 7.9 g/dL — ABNORMAL LOW (ref 12.0–16.0)
Lymphocytes Relative: 25 %
Lymphs Abs: 1.1 10*3/uL (ref 1.0–3.6)
MCH: 30.9 pg (ref 26.0–34.0)
MCHC: 33.1 g/dL (ref 32.0–36.0)
MCV: 93.2 fL (ref 80.0–100.0)
Monocytes Absolute: 0.2 10*3/uL (ref 0.2–0.9)
Monocytes Relative: 5 %
Neutro Abs: 3.1 10*3/uL (ref 1.4–6.5)
Neutrophils Relative %: 68 %
Platelets: 164 10*3/uL (ref 150–440)
RBC: 2.56 MIL/uL — ABNORMAL LOW (ref 3.80–5.20)
RDW: 14.8 % — ABNORMAL HIGH (ref 11.5–14.5)
WBC: 4.5 10*3/uL (ref 3.6–11.0)

## 2016-05-13 LAB — COMPREHENSIVE METABOLIC PANEL
ALT: 35 U/L (ref 14–54)
AST: 32 U/L (ref 15–41)
Albumin: 2.7 g/dL — ABNORMAL LOW (ref 3.5–5.0)
Alkaline Phosphatase: 94 U/L (ref 38–126)
Anion gap: 8 (ref 5–15)
BUN: 8 mg/dL (ref 6–20)
CO2: 24 mmol/L (ref 22–32)
Calcium: 8.2 mg/dL — ABNORMAL LOW (ref 8.9–10.3)
Chloride: 105 mmol/L (ref 101–111)
Creatinine, Ser: 0.43 mg/dL — ABNORMAL LOW (ref 0.44–1.00)
GFR calc Af Amer: >60 mL/min (ref >60)
GFR calc non Af Amer: >60 mL/min (ref >60)
Glucose, Bld: 131 mg/dL — ABNORMAL HIGH (ref 65–99)
Potassium: 3.3 mmol/L — ABNORMAL LOW (ref 3.5–5.1)
Sodium: 137 mmol/L (ref 135–145)
Total Bilirubin: 0.4 mg/dL (ref 0.3–1.2)
Total Protein: 5.6 g/dL — ABNORMAL LOW (ref 6.5–8.1)

## 2016-05-13 LAB — MAGNESIUM: Magnesium: 1.9 mg/dL (ref 1.7–2.4)

## 2016-05-13 MED ORDER — PACLITAXEL PROTEIN-BOUND CHEMO INJECTION 100 MG
100.0000 mg/m2 | Freq: Once | INTRAVENOUS | Status: AC
  Administered 2016-05-13: 150 mg via INTRAVENOUS
  Filled 2016-05-13: qty 30

## 2016-05-13 MED ORDER — SODIUM CHLORIDE 0.9 % IV SOLN
Freq: Once | INTRAVENOUS | Status: AC
  Administered 2016-05-13: 10:00:00 via INTRAVENOUS
  Filled 2016-05-13: qty 1000

## 2016-05-13 MED ORDER — HEPARIN SOD (PORK) LOCK FLUSH 100 UNIT/ML IV SOLN
500.0000 [IU] | Freq: Once | INTRAVENOUS | Status: DC
Start: 2016-05-13 — End: 2016-05-13

## 2016-05-13 MED ORDER — SODIUM CHLORIDE 0.9% FLUSH
10.0000 mL | INTRAVENOUS | Status: DC | PRN
  Administered 2016-05-13: 10 mL via INTRAVENOUS
  Filled 2016-05-13: qty 10

## 2016-05-13 MED ORDER — HEPARIN SOD (PORK) LOCK FLUSH 100 UNIT/ML IV SOLN
INTRAVENOUS | Status: AC
  Filled 2016-05-13: qty 5

## 2016-05-13 MED ORDER — HEPARIN SOD (PORK) LOCK FLUSH 100 UNIT/ML IV SOLN
500.0000 [IU] | Freq: Once | INTRAVENOUS | Status: AC | PRN
  Administered 2016-05-13: 500 [IU]

## 2016-05-13 MED ORDER — SODIUM CHLORIDE 0.9 % IV SOLN
Freq: Once | INTRAVENOUS | Status: AC
  Administered 2016-05-13: 10:00:00 via INTRAVENOUS
  Filled 2016-05-13: qty 4

## 2016-05-13 NOTE — Progress Notes (Signed)
[facility] day:  05/13/2016  Chief Complaint: Christina Mckee is a 55 y.o. female with metastatic squamous cell lung cancer who is seen for assessment prior to day 15 of cycle #1 carboplatin and Abraxane.  HPI: The patient was last seen in the medical oncology clinic on 05/07/2016.  At that time, she received day 8 of cycle #1 carboplatin and Abraxane.  She was anemic (HCT 21.9, hemoglobin 7.3).  She declined transfusion.  Anemia work-up revealed a retic of 0.7%, ferritin of 461, iron <5, TIBC 152 (low), B12 1486, folate 8.2, TSH 5.120 (high) and a free T4 of 1.12 (normal).  During the interim, she has had some drainage in the back of her throat.  Her voice is decreased today.  She has some coughing spurts at night.  Cough is worse laying down.  She continues to have night sweats.  She has no "freezing spells".  She started a MVI with iron.   Past Medical History  Diagnosis Date  . DVT (deep venous thrombosis) ([place])     in 2015  . Menopause     prior to 2014    Past Surgical History  Procedure Laterality Date  . Cesarean section    . Ivc filter placement ([facility] hx)    . Breast cyst excision      left breast removal  . Peripheral vascular catheterization N/A 04/28/2016    Procedure: Glori Luis Cath Insertion;  Surgeon: Algernon Huxley, MD;  Location: [facility];  Service: Cardiovascular;  Laterality: N/A;    Family History  Problem Relation Age of Onset  . Breast cancer Mother   . Breast cancer Maternal Aunt   . Stomach cancer Paternal Uncle   . Leukemia Paternal Uncle     Social History:  reports that she has been smoking Cigarettes.  She has been smoking about 0.25 packs per day. She has never used smokeless tobacco. She reports that she drinks alcohol. She reports that she does not use illicit drugs.  She has smoked 1/2-1 pack a day since age 45. She has a 30-40 pack year smoking history.  She drinks 4 beers a day. She lives in  [place] alone with her cats and dogs. She previously worked at [employer], but notes all positions were recently eliminated. The patient is alone today.  Allergies:  Allergies  Allergen Reactions  . Penicillins Hives and Other (See Comments)    Has patient had a PCN reaction causing immediate rash, facial/tongue/throat swelling, SOB or lightheadedness with hypotension: No Has patient had a PCN reaction causing severe rash involving mucus membranes or skin necrosis: No Has patient had a PCN reaction that required hospitalization No Has patient had a PCN reaction occurring within the last 10 years: No If all of the above answers are "NO", then may proceed with Cephalosporin use.    Current Medications: Current Outpatient Prescriptions  Medication Sig Dispense Refill  . guaifenesin (ROBITUSSIN) 100 MG/5ML syrup Take 200 mg by mouth 3 (three) times daily as needed for cough or congestion.    Marland Kitchen guaiFENesin-codeine (CHERATUSSIN AC) 100-10 MG/5ML syrup Take 5 mLs by mouth 3 (three) times daily as needed for cough. 120 mL 0  . ibuprofen (ADVIL,MOTRIN) 200 MG tablet Take by mouth.    . lidocaine-prilocaine (EMLA) cream Apply 1 application topically as needed. 30 g 6  . metoprolol tartrate (LOPRESSOR) 25 MG tablet Take 1 tablet (25 mg total) by mouth 2 (two) times daily.  60 tablet 2  . nicotine (NICODERM CQ - DOSED IN MG/24 HOURS) 21 mg/24hr patch Place 1 patch (21 mg total) onto the skin daily. 28 patch 0  . ondansetron (ZOFRAN) 8 MG tablet Take 1 tablet (8 mg total) by mouth 2 (two) times daily as needed for refractory nausea / vomiting. Start on day 3 after carboplatin chemo. 30 tablet 1  . prochlorperazine (COMPAZINE) 10 MG tablet Take 1 tablet (10 mg total) by mouth every 6 (six) hours as needed for nausea or vomiting. 30 tablet 6  . potassium chloride (K-DUR) 10 MEQ tablet Take 2 tablets (20 mEq total) by mouth 2 (two) times daily. For 1 week (Patient not taking: Reported on 05/13/2016) 120 tablet  0   No current facility-administered medications for this visit.   Facility-Administered Medications Ordered in Other Visits  Medication Dose Route Frequency Provider Last Rate Last Dose  . heparin lock flush 100 unit/mL  500 Units Intravenous Once Lequita Asal, MD      . sodium chloride flush (NS) 0.9 % injection 10 mL  10 mL Intravenous PRN Lequita Asal, MD   10 mL at 04/29/16 0906  . sodium chloride flush (NS) 0.9 % injection 10 mL  10 mL Intravenous PRN Lequita Asal, MD        Review of Systems:  GENERAL: Feels "ok". No fevers.  No freezing spells.  Night sweats.  Weight down 4 pounds. PERFORMANCE STATUS (ECOG): 1 HEENT: Nasal drainage.  Voice soft.  No visual changes sore throat, mouth sores or tenderness. Lungs: Cough worse when lying down.  No shortness of breath. No hemoptysis. Cardiac: No chest pain, palpitations, orthopnea, or PND. GI: Trying to eat more.  Appetite 75%.  No vomiting, diarrhea, constipation, melena or hematochezia. GU: No urgency, frequency, dysuria, or hematuria. Musculoskeletal: No back pain. No joint pain. No muscle tenderness. Extremities: Chronic left lower extremity edema. No pain. Skin: No rashes or skin changes. Neuro: Short term memory issues.  No headache, numbness or weakness, balance or coordination issues. Endocrine: No diabetes, thyroid issues, hot flashes or night sweats. Psych: No mood changes, depression or anxiety. Pain: No pain. Review of systems: All other systems reviewed and found to be negative  Physical Exam: Blood pressure 129/79, pulse 103, temperature 96.9 F (36.1 C), temperature source Tympanic, resp. rate 19, height '5\' 5"'  (1.651 m), weight 101 lb 15.4 oz (46.25 kg), last menstrual period 04/14/2004, SpO2 96 %. GENERAL: Thin woman sitting comfortably in the exam room in no acute distress. MENTAL STATUS: Alert and oriented to person, place and time. HEAD: Shoulder length gray/blonde hair.  Normocephalic, atraumatic, face symmetric, no Cushingoid features. EYES: Blue eyes. Pupils equal round and reactive to light and accomodation. No conjunctivitis or scleral icterus. ENT: Oropharynx clear without lesion. Tongue normal. Mucous membranes moist.  RESPIRATORY: Clear to auscultation without rales, wheezes or rhonchi. CARDIOVASCULAR: Regular rate and rhythm without murmur, rub or gallop. CHEST  WALL:  Port-a-cath site unremarkable. ABDOMEN: Soft, non-tender, with active bowel sounds, and no hepatosplenomegaly. No masses. SKIN: Spider angiomas on chest.  No rashes, ulcers or lesions. EXTREMITIES: Slight left lower extremity edema. No skin discoloration or tenderness. No palpable cords. LYMPH NODES: No palpable cervical, supraclavicular, axillary or inguinal adenopathy  NEUROLOGICAL: Appropriate. PSYCH:  Appropriate.   Infusion on 05/13/2016  Component Date Value Ref Range Status  . WBC 05/13/2016 4.5  3.6 - 11.0 K/uL Final  . RBC 05/13/2016 2.56* 3.80 - 5.20 MIL/uL Final  . Hemoglobin  05/13/2016 7.9* 12.0 - 16.0 g/dL Final  . HCT 05/13/2016 23.9* 35.0 - 47.0 % Final  . MCV 05/13/2016 93.2  80.0 - 100.0 fL Final  . MCH 05/13/2016 30.9  26.0 - 34.0 pg Final  . MCHC 05/13/2016 33.1  32.0 - 36.0 g/dL Final  . RDW 05/13/2016 14.8* 11.5 - 14.5 % Final  . Platelets 05/13/2016 164  150 - 440 K/uL Final  . Neutrophils Relative % 05/13/2016 68   Final  . Neutro Abs 05/13/2016 3.1  1.4 - 6.5 K/uL Final  . Lymphocytes Relative 05/13/2016 25   Final  . Lymphs Abs 05/13/2016 1.1  1.0 - 3.6 K/uL Final  . Monocytes Relative 05/13/2016 5   Final  . Monocytes Absolute 05/13/2016 0.2  0.2 - 0.9 K/uL Final  . Eosinophils Relative 05/13/2016 1   Final  . Eosinophils Absolute 05/13/2016 0.0  0 - 0.7 K/uL Final  . Basophils Relative 05/13/2016 1   Final  . Basophils Absolute 05/13/2016 0.1  0 - 0.1 K/uL Final  . Sodium 05/13/2016 137  135 - 145 mmol/L Final  . Potassium 05/13/2016  3.3* 3.5 - 5.1 mmol/L Final  . Chloride 05/13/2016 105  101 - 111 mmol/L Final  . CO2 05/13/2016 24  22 - 32 mmol/L Final  . Glucose, Bld 05/13/2016 131* 65 - 99 mg/dL Final  . BUN 05/13/2016 8  6 - 20 mg/dL Final  . Creatinine, Ser 05/13/2016 0.43* 0.44 - 1.00 mg/dL Final  . Calcium 05/13/2016 8.2* 8.9 - 10.3 mg/dL Final  . Total Protein 05/13/2016 5.6* 6.5 - 8.1 g/dL Final  . Albumin 05/13/2016 2.7* 3.5 - 5.0 g/dL Final  . AST 05/13/2016 32  15 - 41 U/L Final  . ALT 05/13/2016 35  14 - 54 U/L Final  . Alkaline Phosphatase 05/13/2016 94  38 - 126 U/L Final  . Total Bilirubin 05/13/2016 0.4  0.3 - 1.2 mg/dL Final  . GFR calc non Af Amer 05/13/2016 >60  >60 mL/min Final  . GFR calc Af Amer 05/13/2016 >60  >60 mL/min Final   Comment: (NOTE) The eGFR has been calculated using the CKD EPI equation. This calculation has not been validated in all clinical situations. eGFR's persistently <60 mL/min signify possible Chronic Kidney Disease.   . Anion gap 05/13/2016 8  5 - 15 Final  Appointment on 05/11/2016  Component Date Value Ref Range Status  . WBC 05/11/2016 4.2  3.6 - 11.0 K/uL Final  . RBC 05/11/2016 2.63* 3.80 - 5.20 MIL/uL Final  . Hemoglobin 05/11/2016 8.1* 12.0 - 16.0 g/dL Final  . HCT 05/11/2016 24.4* 35.0 - 47.0 % Final  . MCV 05/11/2016 92.6  80.0 - 100.0 fL Final  . MCH 05/11/2016 30.8  26.0 - 34.0 pg Final  . MCHC 05/11/2016 33.3  32.0 - 36.0 g/dL Final  . RDW 05/11/2016 14.9* 11.5 - 14.5 % Final  . Platelets 05/11/2016 175  150 - 440 K/uL Final  . Neutrophils Relative % 05/11/2016 64   Final  . Neutro Abs 05/11/2016 2.7  1.4 - 6.5 K/uL Final  . Lymphocytes Relative 05/11/2016 30   Final  . Lymphs Abs 05/11/2016 1.3  1.0 - 3.6 K/uL Final  . Monocytes Relative 05/11/2016 4   Final  . Monocytes Absolute 05/11/2016 0.2  0.2 - 0.9 K/uL Final  . Eosinophils Relative 05/11/2016 1   Final  . Eosinophils Absolute 05/11/2016 0.1  0 - 0.7 K/uL Final  . Basophils Relative  05/11/2016 1  Final  . Basophils Absolute 05/11/2016 0.1  0 - 0.1 K/uL Final  . Blood Bank Specimen 05/11/2016 SAMPLE AVAILABLE FOR TESTING   Final  . Sample Expiration 05/11/2016 05/14/2016   Final  . Potassium 05/11/2016 4.1  3.5 - 5.1 mmol/L Final  . Sed Rate 05/11/2016 75* 0 - 30 mm/hr Final  . Free T4 05/11/2016 1.12  0.61 - 1.12 ng/dL Final   Comment: (NOTE) Biotin ingestion may interfere with free T4 tests. If the results are inconsistent with the TSH level, previous test results, or the clinical presentation, then consider biotin interference. If needed, order repeat testing after stopping biotin.     Assessment:  MAYLIE ASHTON is a 55 y.o. female with metastatic squamous cell lung cancer. Molecular studies were negative studies for EGFR, ALK, and ROS1. PD-L1 was 1%.  Chest CT angiogram on 03/05/2016 revealed 8.7 x 5.7 x 3.3 cm mass in the medial aspect of the superior segment of the left lower lobe extending into the medial aspect of the left upper lobe and into the left hilum. There was vascular and endobronchial encasement and narrowing. There were multiple small left lung nodules. Abdomen and pelvic CT scan revealed diffuse hepatic steatosis and no evidence of metastatic disease in the abdomen or pelvis.  CT guided left lower lobe biopsy on 03/29/2016 revealed moderately differentiated squamous cell carcinoma. CEA and LDH were normal on 04/05/2016.  PET scan on 04/14/2016 revealed hypermetabolic left lower lobe mass with hypermetabolic left hilar adenopathy and left hepatic lobe lesion. Findings were consistent with stage IV primary bronchogenic carcinoma. There were mildly hypermetabolic subcarinal lymph node versus mild esophageal uptake. There was hepatic steatosis. Head CT on 04/15/2016 was normal.  CEA and LDH are normal.  Bone scan on 05/04/2016 revealed no evidence of metastatic disease.  She has a history of an extensive left lower extremity thromboses in  12/2012. Because of the extensive clot burden, she underwent TPA with angioplasty on 12/28/2012. An IVC filter was placed on 12/29/2012 and still remains. Hypercoagulable workup was negative for Factor V Leiden, prothrombin gene mutation, lupus anticoagulant, and antithrombin III. She was on Xarelto for 6 months.  She has a history of elevated liver function tests.  Hepatitis B core antibody total and hepatitis C testing was negative.  She has a normocytic anemia likely secondary to chronic disease.  Work-up on 05/07/2016 revealed a low retic  (0.7%), normal ferritin (461), low iron (<5), low TIBC (152), normal B12 and folate 8.2, and high TSH (5.120) with a normal free T4 (1.12).  She is on a MVI with iron.  She is day 15 of cycle #1 carboplatin and Abraxane (04/30/2016).  She is tolerating her chemotherapy well.  Symptomatically, she feels pretty good.  She has a little drainage in the back of her throat.  She has had no fever.  She has recurrent hypokalemia (3.3).  Plan: 1.  Labs today:  CBC with diff, CMP, Mg. 2.  Discuss anemia.  Suspect anemia of chronic disease and chemotherapy induced anemia. 3.  Day 15 Abraxane 4.  Continue potassium supplementation. 5.  RTC on 05/18/2016 for labs (CBC with diff, hold tube, potassium). 6.  RTC on 05/20/2016 for MD assess, labs (CBC with diff, CMP, Mg), and cycle #2 carboplatin and Abraxane.   Lequita Asal, MD  05/13/2016, 8:55 AM

## 2016-05-13 NOTE — Progress Notes (Signed)
Pt reports having a tickle in the back of her throat and coughing up a moderate amount of clear mucous and sometimes frothy.  Pt reports having no fever.  Voice is a whisper today and could still whisper when she woke up this way last Sunday

## 2016-05-17 NOTE — Progress Notes (Unsigned)
PSN left patient a message, today, asking that she call back to schedule a time to meet to discuss her financial concerns.

## 2016-05-18 ENCOUNTER — Inpatient Hospital Stay: Payer: BLUE CROSS/BLUE SHIELD

## 2016-05-18 DIAGNOSIS — C3492 Malignant neoplasm of unspecified part of left bronchus or lung: Secondary | ICD-10-CM

## 2016-05-18 DIAGNOSIS — E876 Hypokalemia: Secondary | ICD-10-CM

## 2016-05-18 DIAGNOSIS — D649 Anemia, unspecified: Secondary | ICD-10-CM

## 2016-05-18 DIAGNOSIS — C3432 Malignant neoplasm of lower lobe, left bronchus or lung: Secondary | ICD-10-CM | POA: Diagnosis not present

## 2016-05-18 LAB — CBC WITH DIFFERENTIAL/PLATELET
Basophils Absolute: 0.1 10*3/uL (ref 0–0.1)
Basophils Relative: 2 %
Eosinophils Absolute: 0.1 10*3/uL (ref 0–0.7)
Eosinophils Relative: 2 %
HCT: 25.4 % — ABNORMAL LOW (ref 35.0–47.0)
Hemoglobin: 8.3 g/dL — ABNORMAL LOW (ref 12.0–16.0)
Lymphocytes Relative: 41 %
Lymphs Abs: 1.9 10*3/uL (ref 1.0–3.6)
MCH: 30.9 pg (ref 26.0–34.0)
MCHC: 32.9 g/dL (ref 32.0–36.0)
MCV: 94.1 fL (ref 80.0–100.0)
Monocytes Absolute: 0.2 10*3/uL (ref 0.2–0.9)
Monocytes Relative: 5 %
Neutro Abs: 2.3 10*3/uL (ref 1.4–6.5)
Neutrophils Relative %: 50 %
Platelets: 132 10*3/uL — ABNORMAL LOW (ref 150–440)
RBC: 2.7 MIL/uL — ABNORMAL LOW (ref 3.80–5.20)
RDW: 15.7 % — ABNORMAL HIGH (ref 11.5–14.5)
WBC: 4.6 10*3/uL (ref 3.6–11.0)

## 2016-05-18 LAB — SAMPLE TO BLOOD BANK

## 2016-05-20 ENCOUNTER — Ambulatory Visit: Payer: BLUE CROSS/BLUE SHIELD

## 2016-05-20 ENCOUNTER — Inpatient Hospital Stay (HOSPITAL_BASED_OUTPATIENT_CLINIC_OR_DEPARTMENT_OTHER): Payer: BLUE CROSS/BLUE SHIELD | Admitting: Hematology and Oncology

## 2016-05-20 ENCOUNTER — Inpatient Hospital Stay: Payer: BLUE CROSS/BLUE SHIELD

## 2016-05-20 ENCOUNTER — Other Ambulatory Visit: Payer: BLUE CROSS/BLUE SHIELD

## 2016-05-20 ENCOUNTER — Other Ambulatory Visit: Payer: Self-pay | Admitting: Hematology and Oncology

## 2016-05-20 VITALS — BP 125/78 | HR 80 | Temp 98.4°F | Resp 17 | Wt 103.4 lb

## 2016-05-20 DIAGNOSIS — R21 Rash and other nonspecific skin eruption: Secondary | ICD-10-CM

## 2016-05-20 DIAGNOSIS — R600 Localized edema: Secondary | ICD-10-CM

## 2016-05-20 DIAGNOSIS — C3492 Malignant neoplasm of unspecified part of left bronchus or lung: Secondary | ICD-10-CM

## 2016-05-20 DIAGNOSIS — C787 Secondary malignant neoplasm of liver and intrahepatic bile duct: Secondary | ICD-10-CM

## 2016-05-20 DIAGNOSIS — D649 Anemia, unspecified: Secondary | ICD-10-CM

## 2016-05-20 DIAGNOSIS — C3432 Malignant neoplasm of lower lobe, left bronchus or lung: Secondary | ICD-10-CM | POA: Diagnosis not present

## 2016-05-20 DIAGNOSIS — E876 Hypokalemia: Secondary | ICD-10-CM

## 2016-05-20 DIAGNOSIS — Z79899 Other long term (current) drug therapy: Secondary | ICD-10-CM

## 2016-05-20 DIAGNOSIS — E46 Unspecified protein-calorie malnutrition: Secondary | ICD-10-CM

## 2016-05-20 DIAGNOSIS — F17210 Nicotine dependence, cigarettes, uncomplicated: Secondary | ICD-10-CM

## 2016-05-20 DIAGNOSIS — Z86718 Personal history of other venous thrombosis and embolism: Secondary | ICD-10-CM

## 2016-05-20 DIAGNOSIS — K760 Fatty (change of) liver, not elsewhere classified: Secondary | ICD-10-CM

## 2016-05-20 LAB — CBC WITH DIFFERENTIAL/PLATELET
Basophils Absolute: 0.1 10*3/uL (ref 0–0.1)
Basophils Relative: 2 %
Eosinophils Absolute: 0.1 10*3/uL (ref 0–0.7)
Eosinophils Relative: 3 %
HCT: 25.9 % — ABNORMAL LOW (ref 35.0–47.0)
Hemoglobin: 8.6 g/dL — ABNORMAL LOW (ref 12.0–16.0)
Lymphocytes Relative: 33 %
Lymphs Abs: 1.6 10*3/uL (ref 1.0–3.6)
MCH: 31.6 pg (ref 26.0–34.0)
MCHC: 33.3 g/dL (ref 32.0–36.0)
MCV: 94.8 fL (ref 80.0–100.0)
Monocytes Absolute: 0.4 10*3/uL (ref 0.2–0.9)
Monocytes Relative: 8 %
Neutro Abs: 2.7 10*3/uL (ref 1.4–6.5)
Neutrophils Relative %: 54 %
Platelets: 142 10*3/uL — ABNORMAL LOW (ref 150–440)
RBC: 2.73 MIL/uL — ABNORMAL LOW (ref 3.80–5.20)
RDW: 15.9 % — ABNORMAL HIGH (ref 11.5–14.5)
WBC: 4.9 10*3/uL (ref 3.6–11.0)

## 2016-05-20 LAB — MAGNESIUM: Magnesium: 2 mg/dL (ref 1.7–2.4)

## 2016-05-20 LAB — COMPREHENSIVE METABOLIC PANEL
ALT: 35 U/L (ref 14–54)
AST: 35 U/L (ref 15–41)
Albumin: 3.2 g/dL — ABNORMAL LOW (ref 3.5–5.0)
Alkaline Phosphatase: 101 U/L (ref 38–126)
Anion gap: 7 (ref 5–15)
BUN: 12 mg/dL (ref 6–20)
CO2: 25 mmol/L (ref 22–32)
Calcium: 8.1 mg/dL — ABNORMAL LOW (ref 8.9–10.3)
Chloride: 107 mmol/L (ref 101–111)
Creatinine, Ser: 0.44 mg/dL (ref 0.44–1.00)
GFR calc Af Amer: >60 mL/min (ref >60)
GFR calc non Af Amer: >60 mL/min (ref >60)
Glucose, Bld: 134 mg/dL — ABNORMAL HIGH (ref 65–99)
Potassium: 3.6 mmol/L (ref 3.5–5.1)
Sodium: 139 mmol/L (ref 135–145)
Total Bilirubin: 0.3 mg/dL (ref 0.3–1.2)
Total Protein: 6.1 g/dL — ABNORMAL LOW (ref 6.5–8.1)

## 2016-05-20 MED ORDER — HEPARIN SOD (PORK) LOCK FLUSH 100 UNIT/ML IV SOLN
500.0000 [IU] | Freq: Once | INTRAVENOUS | Status: DC
  Filled 2016-05-20: qty 5

## 2016-05-20 MED ORDER — HEPARIN SOD (PORK) LOCK FLUSH 100 UNIT/ML IV SOLN
500.0000 [IU] | Freq: Once | INTRAVENOUS | Status: AC
  Administered 2016-05-20: 500 [IU] via INTRAVENOUS

## 2016-05-20 MED ORDER — HEPARIN SOD (PORK) LOCK FLUSH 100 UNIT/ML IV SOLN
INTRAVENOUS | Status: AC
  Filled 2016-05-20: qty 5

## 2016-05-20 NOTE — Progress Notes (Signed)
[facility] day:  05/20/2016  Chief Complaint: Christina Mckee is a 55 y.o. female with metastatic squamous cell lung cancer who is seen for assessment prior to day 1 of cycle #2 carboplatin and Abraxane.  HPI: The patient was last seen in the medical oncology clinic on 05/13/2016.  At that time, she received day 15 of cycle #1 carboplatin and Abraxane.  She was tolerating chemotherapy well.  Her hematocrit had improved slightly.  She continued to have hypokalemia on supplementation.    During the interim, she stats that her voice has improved.  Her cough has improved.  She is sleeping better.  She notes some right ankle soreness.  She notes no increase in lower extremity edema.  She has a rash on her hands and face for which she is using vitamin C ointment.  She has gained 2 pounds.   Past Medical History  Diagnosis Date  . DVT (deep venous thrombosis) ([place])     in 2015  . Menopause     prior to 2014    Past Surgical History  Procedure Laterality Date  . Cesarean section    . Ivc filter placement ([facility] hx)    . Breast cyst excision      left breast removal  . Peripheral vascular catheterization N/A 04/28/2016    Procedure: Glori Luis Cath Insertion;  Surgeon: Algernon Huxley, MD;  Location: [facility];  Service: Cardiovascular;  Laterality: N/A;    Family History  Problem Relation Age of Onset  . Breast cancer Mother   . Breast cancer Maternal Aunt   . Stomach cancer Paternal Uncle   . Leukemia Paternal Uncle     Social History:  reports that she has been smoking Cigarettes.  She has been smoking about 0.25 packs per day. She has never used smokeless tobacco. She reports that she drinks alcohol. She reports that she does not use illicit drugs.  She has smoked 1/2-1 pack a day since age 86. She has a 30-40 pack year smoking history.  She drinks 4 beers a day. She lives in [place] alone with her cats and dogs. She previously worked  at [employer], but notes all positions were recently eliminated. She continues to work.  Thursdays are better treatment days for her.  The patient is alone today.  Allergies:  Allergies  Allergen Reactions  . Penicillins Hives and Other (See Comments)    Has patient had a PCN reaction causing immediate rash, facial/tongue/throat swelling, SOB or lightheadedness with hypotension: No Has patient had a PCN reaction causing severe rash involving mucus membranes or skin necrosis: No Has patient had a PCN reaction that required hospitalization No Has patient had a PCN reaction occurring within the last 10 years: No If all of the above answers are "NO", then may proceed with Cephalosporin use.    Current Medications: Current Outpatient Prescriptions  Medication Sig Dispense Refill  . guaifenesin (ROBITUSSIN) 100 MG/5ML syrup Take 200 mg by mouth 3 (three) times daily as needed for cough or congestion.    Marland Kitchen guaiFENesin-codeine (CHERATUSSIN AC) 100-10 MG/5ML syrup Take 5 mLs by mouth 3 (three) times daily as needed for cough. 120 mL 0  . ibuprofen (ADVIL,MOTRIN) 200 MG tablet Take by mouth.    . lidocaine-prilocaine (EMLA) cream Apply 1 application topically as needed. 30 g 6  . metoprolol tartrate (LOPRESSOR) 25 MG tablet Take 1 tablet (25 mg total) by mouth 2 (two) times daily. [place]  tablet 2  . nicotine (NICODERM CQ - DOSED IN MG/24 HOURS) 21 mg/24hr patch Place 1 patch (21 mg total) onto the skin daily. 28 patch 0  . ondansetron (ZOFRAN) 8 MG tablet Take 1 tablet (8 mg total) by mouth 2 (two) times daily as needed for refractory nausea / vomiting. Start on day 3 after carboplatin chemo. 30 tablet 1  . potassium chloride (K-DUR) 10 MEQ tablet Take 2 tablets (20 mEq total) by mouth 2 (two) times daily. For 1 week 120 tablet 0  . prochlorperazine (COMPAZINE) 10 MG tablet Take 1 tablet (10 mg total) by mouth every 6 (six) hours as needed for nausea or vomiting. 30 tablet 6   No current facility-administered  medications for this visit.   Facility-Administered Medications Ordered in Other Visits  Medication Dose Route Frequency Provider Last Rate Last Dose  . sodium chloride flush (NS) 0.9 % injection 10 mL  10 mL Intravenous PRN Lequita Asal, MD   10 mL at 04/29/16 6283    Review of Systems:  GENERAL: Fatigue. No fevers or sweats.  Weight up 2 pounds. PERFORMANCE STATUS (ECOG): 1 HEENT: Voice improved.  No visual changes, runny nose, sore throat, mouth sores or tenderness. Lungs: Chronic cough, improved.No increased shortness of breath.  No hemoptysis. Cardiac: No chest pain, palpitations, orthopnea, or PND. GI: gaining weight.  Food sometimes causes nausea.  No vomiting, diarrhea, constipation, melena or hematochezia. GU: No urgency, frequency, dysuria, or hematuria. Musculoskeletal: No back pain. Right ankle pain. No muscle tenderness. Extremities: Chronic left lower extremity edema. No pain. Skin: No rashes or skin changes. Neuro: Short term memory issues.  No headache, numbness or weakness, balance or coordination issues. Endocrine: No diabetes, thyroid issues, hot flashes or night sweats. Psych: No mood changes, depression or anxiety. Pain: Ankle pain. Review of systems: All other systems reviewed and found to be negative  Physical Exam: Blood pressure 125/78, pulse 80, temperature 98.4 F (36.9 C), temperature source Oral, resp. rate 17, weight 103 lb 6.3 oz (46.9 kg), last menstrual period 04/14/2004, SpO2 100 %. GENERAL: Thin woman sitting comfortably in the exam room in no acute distress. MENTAL STATUS: Alert and oriented to person, place and time. HEAD: Thin white hair. Normocephalic, atraumatic, face symmetric, no Cushingoid features. EYES: Blue eyes. Pupils equal round and reactive to light and accomodation. No conjunctivitis or scleral icterus. ENT: Oropharynx clear without lesion. Tongue normal. Mucous membranes moist.  RESPIRATORY: Clear  to auscultation without rales, wheezes or rhonchi. CARDIOVASCULAR: Regular rate and rhythm without murmur, rub or gallop. ABDOMEN: Soft, non-tender, with active bowel sounds, and no hepatosplenomegaly. No masses. SKIN: Spider angiomas on chest.  No rashes, ulcers or lesions. EXTREMITIES: Slight left lower extremity edema (stable). Right ankle unremarkable.  No skin discoloration or tenderness. No palpable cords. LYMPH NODES: No palpable cervical, supraclavicular, axillary or inguinal adenopathy  NEUROLOGICAL: Appropriate. PSYCH:  Appropriate.   Appointment on 05/20/2016  Component Date Value Ref Range Status  . WBC 05/20/2016 4.9  3.6 - 11.0 K/uL Final  . RBC 05/20/2016 2.73* 3.80 - 5.20 MIL/uL Final  . Hemoglobin 05/20/2016 8.6* 12.0 - 16.0 g/dL Final  . HCT 05/20/2016 25.9* 35.0 - 47.0 % Final  . MCV 05/20/2016 94.8  80.0 - 100.0 fL Final  . MCH 05/20/2016 31.6  26.0 - 34.0 pg Final  . MCHC 05/20/2016 33.3  32.0 - 36.0 g/dL Final  . RDW 05/20/2016 15.9* 11.5 - 14.5 % Final  . Platelets 05/20/2016 142* 150 - 440  K/uL Final  . Neutrophils Relative % 05/20/2016 54   Final  . Neutro Abs 05/20/2016 2.7  1.4 - 6.5 K/uL Final  . Lymphocytes Relative 05/20/2016 33   Final  . Lymphs Abs 05/20/2016 1.6  1.0 - 3.6 K/uL Final  . Monocytes Relative 05/20/2016 8   Final  . Monocytes Absolute 05/20/2016 0.4  0.2 - 0.9 K/uL Final  . Eosinophils Relative 05/20/2016 3   Final  . Eosinophils Absolute 05/20/2016 0.1  0 - 0.7 K/uL Final  . Basophils Relative 05/20/2016 2   Final  . Basophils Absolute 05/20/2016 0.1  0 - 0.1 K/uL Final  . Sodium 05/20/2016 139  135 - 145 mmol/L Final  . Potassium 05/20/2016 3.6  3.5 - 5.1 mmol/L Final  . Chloride 05/20/2016 107  101 - 111 mmol/L Final  . CO2 05/20/2016 25  22 - 32 mmol/L Final  . Glucose, Bld 05/20/2016 134* 65 - 99 mg/dL Final  . BUN 05/20/2016 12  6 - 20 mg/dL Final  . Creatinine, Ser 05/20/2016 0.44  0.44 - 1.00 mg/dL Final  . Calcium  05/20/2016 8.1* 8.9 - 10.3 mg/dL Final  . Total Protein 05/20/2016 6.1* 6.5 - 8.1 g/dL Final  . Albumin 05/20/2016 3.2* 3.5 - 5.0 g/dL Final  . AST 05/20/2016 35  15 - 41 U/L Final  . ALT 05/20/2016 35  14 - 54 U/L Final  . Alkaline Phosphatase 05/20/2016 101  38 - 126 U/L Final  . Total Bilirubin 05/20/2016 PENDING  0.3 - 1.2 mg/dL Incomplete  . GFR calc non Af Amer 05/20/2016 >60  >60 mL/min Final  . GFR calc Af Amer 05/20/2016 >60  >60 mL/min Final   Comment: (NOTE) The eGFR has been calculated using the CKD EPI equation. This calculation has not been validated in all clinical situations. eGFR's persistently <60 mL/min signify possible Chronic Kidney Disease.   . Anion gap 05/20/2016 7  5 - 15 Final  . Magnesium 05/20/2016 2.0  1.7 - 2.4 mg/dL Final  Appointment on 05/18/2016  Component Date Value Ref Range Status  . WBC 05/18/2016 4.6  3.6 - 11.0 K/uL Final  . RBC 05/18/2016 2.70* 3.80 - 5.20 MIL/uL Final  . Hemoglobin 05/18/2016 8.3* 12.0 - 16.0 g/dL Final  . HCT 05/18/2016 25.4* 35.0 - 47.0 % Final  . MCV 05/18/2016 94.1  80.0 - 100.0 fL Final  . MCH 05/18/2016 30.9  26.0 - 34.0 pg Final  . MCHC 05/18/2016 32.9  32.0 - 36.0 g/dL Final  . RDW 05/18/2016 15.7* 11.5 - 14.5 % Final  . Platelets 05/18/2016 132* 150 - 440 K/uL Final  . Neutrophils Relative % 05/18/2016 50   Final  . Neutro Abs 05/18/2016 2.3  1.4 - 6.5 K/uL Final  . Lymphocytes Relative 05/18/2016 41   Final  . Lymphs Abs 05/18/2016 1.9  1.0 - 3.6 K/uL Final  . Monocytes Relative 05/18/2016 5   Final  . Monocytes Absolute 05/18/2016 0.2  0.2 - 0.9 K/uL Final  . Eosinophils Relative 05/18/2016 2   Final  . Eosinophils Absolute 05/18/2016 0.1  0 - 0.7 K/uL Final  . Basophils Relative 05/18/2016 2   Final  . Basophils Absolute 05/18/2016 0.1  0 - 0.1 K/uL Final  . Blood Bank Specimen 05/18/2016 SAMPLE AVAILABLE FOR TESTING   Final  . Sample Expiration 05/18/2016 05/21/2016   Final    Assessment:  Christina Mckee  is a 55 y.o. female with metastatic squamous cell lung cancer. Molecular studies  were negative studies for EGFR, ALK, and ROS1. PD-L1 was 1%.  Chest CT angiogram on 03/05/2016 revealed 8.7 x 5.7 x 3.3 cm mass in the medial aspect of the superior segment of the left lower lobe extending into the medial aspect of the left upper lobe and into the left hilum. There was vascular and endobronchial encasement and narrowing. There were multiple small left lung nodules. Abdomen and pelvic CT scan revealed diffuse hepatic steatosis and no evidence of metastatic disease in the abdomen or pelvis.  CT guided left lower lobe biopsy on 03/29/2016 revealed moderately differentiated squamous cell carcinoma. CEA and LDH were normal on 04/05/2016.  PET scan on 04/14/2016 revealed hypermetabolic left lower lobe mass with hypermetabolic left hilar adenopathy and left hepatic lobe lesion. Findings were consistent with stage IV primary bronchogenic carcinoma. There were mildly hypermetabolic subcarinal lymph node versus mild esophageal uptake. There was hepatic steatosis. Head CT on 04/15/2016 was normal.  CEA and LDH are normal.  She has a history of an extensive left lower extremity thromboses in 12/2012. Because of the extensive clot burden, she underwent TPA with angioplasty on 12/28/2012. An IVC filter was placed on 12/29/2012 and still remains. Hypercoagulable workup was negative for Factor V Leiden, prothrombin gene mutation, lupus anticoagulant, and antithrombin III. She was on Xarelto for 6 months.  She is s/p 1 cycle of carboplatin and Abraxane (04/30/2016 - 05/13/2016).  She is tolerating her chemotherapy well.  Symptomatically, she is slightly fatigued.  Her cough has improved.  Her albumen (2.3 to 3.2) and hematocrit (21.9 to 25.9) have improved.  She has gained 2 pounds.   Potassium is normal (3.6).  WBC is 4600.  Platelet count is 132,000.  Plan: 1.  Labs today:  CBC with diff, CMP, Mg. 2.  Review  labs and imaging per patient request.  Discuss clinical improvement.  Discuss myelosuppression associated with carboplatin + Abraxane.  Discuss postponing initiation of cycle #2 for 1 week.  Discuss ongoing caloric intake. 3.  Postpone cycle #2 today. 4.  Continue potassium supplementation (1/2 dose). 5.  RTC in 1 week for MD assessment, labs (CBC with diff, CMP, Mg), and day 1 of cycle #2 carboplatin and Abraxane.   Lequita Asal, MD  05/20/2016, 10:41 AM

## 2016-05-20 NOTE — Progress Notes (Signed)
Here for her next chemotherapy. Complaining of hoarseness now. Cough seems better. RN noticed rash like bumps on hands . Pt states this started after chemotherapy. Continues to have R ankle pain esp at nighttime which interfered with sleep last night.

## 2016-05-21 ENCOUNTER — Other Ambulatory Visit: Payer: Self-pay

## 2016-05-21 ENCOUNTER — Telehealth: Payer: Self-pay | Admitting: *Deleted

## 2016-05-21 DIAGNOSIS — C3492 Malignant neoplasm of unspecified part of left bronchus or lung: Secondary | ICD-10-CM

## 2016-05-21 NOTE — Telephone Encounter (Signed)
Per computer, she was ordered #`120 tabs on 6/16, Per pharmacy, they called and spoke with someone who told them to just give her #60. Verbal given to give her #360  Tabs with no refills

## 2016-05-21 NOTE — Telephone Encounter (Signed)
   Ref Range 1d ago    Sodium 135 - 145 mmol/L 139   Potassium 3.5 - 5.1 mmol/L 3.6

## 2016-05-22 ENCOUNTER — Encounter: Payer: Self-pay | Admitting: Hematology and Oncology

## 2016-05-22 DIAGNOSIS — E46 Unspecified protein-calorie malnutrition: Secondary | ICD-10-CM | POA: Insufficient documentation

## 2016-05-28 ENCOUNTER — Inpatient Hospital Stay: Payer: BLUE CROSS/BLUE SHIELD

## 2016-05-28 ENCOUNTER — Inpatient Hospital Stay (HOSPITAL_BASED_OUTPATIENT_CLINIC_OR_DEPARTMENT_OTHER): Payer: BLUE CROSS/BLUE SHIELD | Admitting: Hematology and Oncology

## 2016-05-28 ENCOUNTER — Encounter: Payer: Self-pay | Admitting: Hematology and Oncology

## 2016-05-28 ENCOUNTER — Encounter (INDEPENDENT_AMBULATORY_CARE_PROVIDER_SITE_OTHER): Payer: Self-pay

## 2016-05-28 ENCOUNTER — Inpatient Hospital Stay: Payer: BLUE CROSS/BLUE SHIELD | Attending: Hematology and Oncology

## 2016-05-28 VITALS — BP 115/67 | HR 81 | Temp 97.2°F | Ht 65.0 in | Wt 106.4 lb

## 2016-05-28 DIAGNOSIS — Z86718 Personal history of other venous thrombosis and embolism: Secondary | ICD-10-CM

## 2016-05-28 DIAGNOSIS — C3432 Malignant neoplasm of lower lobe, left bronchus or lung: Secondary | ICD-10-CM

## 2016-05-28 DIAGNOSIS — C3492 Malignant neoplasm of unspecified part of left bronchus or lung: Secondary | ICD-10-CM

## 2016-05-28 DIAGNOSIS — F17210 Nicotine dependence, cigarettes, uncomplicated: Secondary | ICD-10-CM | POA: Insufficient documentation

## 2016-05-28 DIAGNOSIS — K760 Fatty (change of) liver, not elsewhere classified: Secondary | ICD-10-CM

## 2016-05-28 DIAGNOSIS — Z79899 Other long term (current) drug therapy: Secondary | ICD-10-CM | POA: Diagnosis not present

## 2016-05-28 DIAGNOSIS — C787 Secondary malignant neoplasm of liver and intrahepatic bile duct: Secondary | ICD-10-CM

## 2016-05-28 LAB — COMPREHENSIVE METABOLIC PANEL
ALT: 16 U/L (ref 14–54)
AST: 17 U/L (ref 15–41)
Albumin: 3.5 g/dL (ref 3.5–5.0)
Alkaline Phosphatase: 112 U/L (ref 38–126)
Anion gap: 5 (ref 5–15)
BUN: 8 mg/dL (ref 6–20)
CO2: 25 mmol/L (ref 22–32)
Calcium: 8.6 mg/dL — ABNORMAL LOW (ref 8.9–10.3)
Chloride: 107 mmol/L (ref 101–111)
Creatinine, Ser: 0.37 mg/dL — ABNORMAL LOW (ref 0.44–1.00)
GFR calc Af Amer: >60 mL/min (ref >60)
GFR calc non Af Amer: >60 mL/min (ref >60)
Glucose, Bld: 95 mg/dL (ref 65–99)
Potassium: 4.1 mmol/L (ref 3.5–5.1)
Sodium: 137 mmol/L (ref 135–145)
Total Bilirubin: 0.3 mg/dL (ref 0.3–1.2)
Total Protein: 6.6 g/dL (ref 6.5–8.1)

## 2016-05-28 LAB — CBC WITH DIFFERENTIAL/PLATELET
Basophils Absolute: 0.1 10*3/uL (ref 0–0.1)
Basophils Relative: 1 %
Eosinophils Absolute: 0.2 10*3/uL (ref 0–0.7)
Eosinophils Relative: 3 %
HCT: 29.5 % — ABNORMAL LOW (ref 35.0–47.0)
Hemoglobin: 9.8 g/dL — ABNORMAL LOW (ref 12.0–16.0)
Lymphocytes Relative: 28 %
Lymphs Abs: 2 10*3/uL (ref 1.0–3.6)
MCH: 31.9 pg (ref 26.0–34.0)
MCHC: 33.3 g/dL (ref 32.0–36.0)
MCV: 95.8 fL (ref 80.0–100.0)
Monocytes Absolute: 0.9 10*3/uL (ref 0.2–0.9)
Monocytes Relative: 12 %
Neutro Abs: 4.1 10*3/uL (ref 1.4–6.5)
Neutrophils Relative %: 56 %
Platelets: 321 10*3/uL (ref 150–440)
RBC: 3.08 MIL/uL — ABNORMAL LOW (ref 3.80–5.20)
RDW: 19.7 % — ABNORMAL HIGH (ref 11.5–14.5)
WBC: 7.2 10*3/uL (ref 3.6–11.0)

## 2016-05-28 LAB — MAGNESIUM: Magnesium: 2.1 mg/dL (ref 1.7–2.4)

## 2016-05-28 MED ORDER — SODIUM CHLORIDE 0.9% FLUSH
10.0000 mL | INTRAVENOUS | Status: DC | PRN
  Administered 2016-05-28: 10 mL
  Filled 2016-05-28: qty 10

## 2016-05-28 MED ORDER — HEPARIN SOD (PORK) LOCK FLUSH 100 UNIT/ML IV SOLN
500.0000 [IU] | Freq: Once | INTRAVENOUS | Status: DC | PRN
  Filled 2016-05-28 (×2): qty 5

## 2016-05-28 MED ORDER — SODIUM CHLORIDE 0.9 % IV SOLN
493.8000 mg | Freq: Once | INTRAVENOUS | Status: AC
  Administered 2016-05-28: 490 mg via INTRAVENOUS
  Filled 2016-05-28: qty 49

## 2016-05-28 MED ORDER — PALONOSETRON HCL INJECTION 0.25 MG/5ML
0.2500 mg | Freq: Once | INTRAVENOUS | Status: AC
  Administered 2016-05-28: 0.25 mg via INTRAVENOUS
  Filled 2016-05-28: qty 5

## 2016-05-28 MED ORDER — PACLITAXEL PROTEIN-BOUND CHEMO INJECTION 100 MG
100.0000 mg/m2 | Freq: Once | INTRAVENOUS | Status: AC
  Administered 2016-05-28: 150 mg via INTRAVENOUS
  Filled 2016-05-28: qty 30

## 2016-05-28 MED ORDER — SODIUM CHLORIDE 0.9 % IV SOLN
Freq: Once | INTRAVENOUS | Status: AC
  Administered 2016-05-28: 11:00:00 via INTRAVENOUS
  Filled 2016-05-28: qty 1000

## 2016-05-28 MED ORDER — SODIUM CHLORIDE 0.9 % IV SOLN
10.0000 mg | Freq: Once | INTRAVENOUS | Status: AC
  Administered 2016-05-28: 10 mg via INTRAVENOUS
  Filled 2016-05-28: qty 1

## 2016-05-28 MED ORDER — HEPARIN SOD (PORK) LOCK FLUSH 100 UNIT/ML IV SOLN
500.0000 [IU] | Freq: Once | INTRAVENOUS | Status: AC
  Administered 2016-05-28: 500 [IU] via INTRAVENOUS

## 2016-05-28 NOTE — Progress Notes (Signed)
Patient here for pretreatment/follow up check. Patient has gained 5 pounds in the last three weeks. She has been experiencing a "new" pain in her left side. Pain some times reaches a level 10. She states that 2 ibuprofen relives the pain.

## 2016-05-28 NOTE — Progress Notes (Signed)
[facility] day:  05/28/2016  Chief Complaint: Christina Mckee is a 55 y.o. female with metastatic squamous cell lung cancer who is seen for assessment prior to day 1 of cycle #2 carboplatin and Abraxane.  HPI: The patient was last seen in the medical oncology clinic on 05/20/2016.  At that time, decision was made to postpone cycle #2 chemotherapy.   During the interim, she has felt better.  She describes being less foggy.  Her knees have not been buckling.  She has had itchy feet and is using Nivea moisturizer and compression stockings.  She sometimes has a pain in her left side similar to a pain she had in her right side associated with muscle strain.  She denies any change in lower extremity edema, calf pain, pleuritic chest pain, shortness of breath or hemoptysis.  She has worked all week.  She has gained 3 pounds.   Past Medical History  Diagnosis Date  . DVT (deep venous thrombosis) ([place])     in 2015  . Menopause     prior to 2014    Past Surgical History  Procedure Laterality Date  . Cesarean section    . Ivc filter placement ([facility] hx)    . Breast cyst excision      left breast removal  . Peripheral vascular catheterization N/A 04/28/2016    Procedure: Glori Luis Cath Insertion;  Surgeon: Algernon Huxley, MD;  Location: [facility];  Service: Cardiovascular;  Laterality: N/A;    Family History  Problem Relation Age of Onset  . Breast cancer Mother   . Breast cancer Maternal Aunt   . Stomach cancer Paternal Uncle   . Leukemia Paternal Uncle     Social History:  reports that she has been smoking Cigarettes.  She has been smoking about 0.25 packs per day. She has never used smokeless tobacco. She reports that she drinks alcohol. She reports that she does not use illicit drugs.  She has smoked 1/2-1 pack a day since age 29. She has a 30-40 pack year smoking history.  She drinks 4 beers a day. She lives in [place] alone with her  cats and dogs. She previously worked at [employer], but notes all positions were recently eliminated. She continues to work.  Thursdays are better treatment days for her.  The patient is alone today.  Allergies:  Allergies  Allergen Reactions  . Penicillins Hives and Other (See Comments)    Has patient had a PCN reaction causing immediate rash, facial/tongue/throat swelling, SOB or lightheadedness with hypotension: No Has patient had a PCN reaction causing severe rash involving mucus membranes or skin necrosis: No Has patient had a PCN reaction that required hospitalization No Has patient had a PCN reaction occurring within the last 10 years: No If all of the above answers are "NO", then may proceed with Cephalosporin use.    Current Medications: Current Outpatient Prescriptions  Medication Sig Dispense Refill  . guaifenesin (ROBITUSSIN) 100 MG/5ML syrup Take 200 mg by mouth 3 (three) times daily as needed for cough or congestion.    Marland Kitchen guaiFENesin-codeine (CHERATUSSIN AC) 100-10 MG/5ML syrup Take 5 mLs by mouth 3 (three) times daily as needed for cough. 120 mL 0  . ibuprofen (ADVIL,MOTRIN) 200 MG tablet Take by mouth.    . lidocaine-prilocaine (EMLA) cream Apply 1 application topically as needed. 30 g 6  . metoprolol tartrate (LOPRESSOR) 25 MG tablet Take 1 tablet (25 mg total) by  mouth 2 (two) times daily. 60 tablet 2  . nicotine (NICODERM CQ - DOSED IN MG/24 HOURS) 21 mg/24hr patch Place 1 patch (21 mg total) onto the skin daily. 28 patch 0  . ondansetron (ZOFRAN) 8 MG tablet Take 1 tablet (8 mg total) by mouth 2 (two) times daily as needed for refractory nausea / vomiting. Start on day 3 after carboplatin chemo. 30 tablet 1  . potassium chloride (K-DUR) 10 MEQ tablet Take 2 tablets (20 mEq total) by mouth 2 (two) times daily. For 1 week 120 tablet 0  . prochlorperazine (COMPAZINE) 10 MG tablet Take 1 tablet (10 mg total) by mouth every 6 (six) hours as needed for nausea or vomiting. 30 tablet 6    No current facility-administered medications for this visit.   Facility-Administered Medications Ordered in Other Visits  Medication Dose Route Frequency Provider Last Rate Last Dose  . sodium chloride flush (NS) 0.9 % injection 10 mL  10 mL Intravenous PRN Lequita Asal, MD   10 mL at 04/29/16 3149    Review of Systems:  GENERAL: Feels better. No fevers or sweats.  Weight up 3 pounds. PERFORMANCE STATUS (ECOG): 1 HEENT: No visual changes, runny nose, sore throat, mouth sores or tenderness. Lungs: Chronic cough, improved.No increased shortness of breath.  No hemoptysis. Cardiac: No chest pain, palpitations, orthopnea, or PND. GI: Gaining weight.  Eating better.  No vomiting, diarrhea, constipation, melena or hematochezia. GU: No urgency, frequency, dysuria, or hematuria. Musculoskeletal: Left sided musculoskeletal pain.  No back pain. No muscle tenderness. Extremities: Chronic left lower extremity edema. No pain. Skin: itchy feet, using moisturizer.  No rashes or skin changes. Neuro: Short term memory issues.  No headache, numbness or weakness, balance or coordination issues. Endocrine: No diabetes, thyroid issues, hot flashes or night sweats. Psych: No mood changes, depression or anxiety. Pain: Ankle pain. Review of systems: All other systems reviewed and found to be negative  Physical Exam: Blood pressure 115/67, pulse 81, temperature 97.2 F (36.2 C), temperature source Oral, height _0  (1.651 m), weight 106 lb 6 oz (48.25 kg), last menstrual period 04/14/2004. GENERAL: Thin woman sitting comfortably in the exam room in no acute distress. MENTAL STATUS: Alert and oriented to person, place and time. HEAD: Wearing a beige crochet hat.  Thin white hair. Normocephalic, atraumatic, face symmetric, no Cushingoid features. EYES: Blue eyes. Pupils equal round and reactive to light and accomodation. No conjunctivitis or scleral icterus. ENT: Oropharynx  clear without lesion. Tongue normal. Mucous membranes moist.  RESPIRATORY: Clear to auscultation without rales, wheezes or rhonchi. CARDIOVASCULAR: Regular rate and rhythm without murmur, rub or gallop. CHEST WALL:  Pain on palpation left chest wall.  No erythema or induration. ABDOMEN: Soft, non-tender, with active bowel sounds, and no hepatosplenomegaly. No masses. SKIN: Spider angiomas on chest.  No rashes, ulcers or lesions. EXTREMITIES: Wearing compression stockings.  No edema.  No skin discoloration or tenderness. No palpable cords. LYMPH NODES: No palpable cervical, supraclavicular, axillary or inguinal adenopathy  NEUROLOGICAL: Appropriate. PSYCH:  Appropriate.   Appointment on 05/28/2016  Component Date Value Ref Range Status  . WBC 05/28/2016 7.2  3.6 - 11.0 K/uL Final  . RBC 05/28/2016 3.08* 3.80 - 5.20 MIL/uL Final  . Hemoglobin 05/28/2016 9.8* 12.0 - 16.0 g/dL Final  . HCT 05/28/2016 29.5* 35.0 - 47.0 % Final  . MCV 05/28/2016 95.8  80.0 - 100.0 fL Final  . MCH 05/28/2016 31.9  26.0 - 34.0 pg Final  . MCHC 05/28/2016  33.3  32.0 - 36.0 g/dL Final  . RDW 05/28/2016 19.7* 11.5 - 14.5 % Final  . Platelets 05/28/2016 321  150 - 440 K/uL Final  . Neutrophils Relative % 05/28/2016 56   Final  . Neutro Abs 05/28/2016 4.1  1.4 - 6.5 K/uL Final  . Lymphocytes Relative 05/28/2016 28   Final  . Lymphs Abs 05/28/2016 2.0  1.0 - 3.6 K/uL Final  . Monocytes Relative 05/28/2016 12   Final  . Monocytes Absolute 05/28/2016 0.9  0.2 - 0.9 K/uL Final  . Eosinophils Relative 05/28/2016 3   Final  . Eosinophils Absolute 05/28/2016 0.2  0 - 0.7 K/uL Final  . Basophils Relative 05/28/2016 1   Final  . Basophils Absolute 05/28/2016 0.1  0 - 0.1 K/uL Final  . Sodium 05/28/2016 137  135 - 145 mmol/L Final  . Potassium 05/28/2016 4.1  3.5 - 5.1 mmol/L Final  . Chloride 05/28/2016 107  101 - 111 mmol/L Final  . CO2 05/28/2016 25  22 - 32 mmol/L Final  . Glucose, Bld 05/28/2016 95  65 -  99 mg/dL Final  . BUN 05/28/2016 8  6 - 20 mg/dL Final  . Creatinine, Ser 05/28/2016 0.37* 0.44 - 1.00 mg/dL Final  . Calcium 05/28/2016 8.6* 8.9 - 10.3 mg/dL Final  . Total Protein 05/28/2016 6.6  6.5 - 8.1 g/dL Final  . Albumin 05/28/2016 3.5  3.5 - 5.0 g/dL Final  . AST 05/28/2016 17  15 - 41 U/L Final  . ALT 05/28/2016 16  14 - 54 U/L Final  . Alkaline Phosphatase 05/28/2016 112  38 - 126 U/L Final  . Total Bilirubin 05/28/2016 0.3  0.3 - 1.2 mg/dL Final  . GFR calc non Af Amer 05/28/2016 >60  >60 mL/min Final  . GFR calc Af Amer 05/28/2016 >60  >60 mL/min Final   Comment: (NOTE) The eGFR has been calculated using the CKD EPI equation. This calculation has not been validated in all clinical situations. eGFR's persistently <60 mL/min signify possible Chronic Kidney Disease.   . Anion gap 05/28/2016 5  5 - 15 Final  . Magnesium 05/28/2016 2.1  1.7 - 2.4 mg/dL Final    Assessment:  Christina Mckee is a 55 y.o. female with metastatic squamous cell lung cancer. Molecular studies were negative studies for EGFR, ALK, and ROS1. PD-L1 was 1%.  Chest CT angiogram on 03/05/2016 revealed 8.7 x 5.7 x 3.3 cm mass in the medial aspect of the superior segment of the left lower lobe extending into the medial aspect of the left upper lobe and into the left hilum. There was vascular and endobronchial encasement and narrowing. There were multiple small left lung nodules. Abdomen and pelvic CT scan revealed diffuse hepatic steatosis and no evidence of metastatic disease in the abdomen or pelvis.  CT guided left lower lobe biopsy on 03/29/2016 revealed moderately differentiated squamous cell carcinoma. CEA and LDH were normal on 04/05/2016.  PET scan on 04/14/2016 revealed hypermetabolic left lower lobe mass with hypermetabolic left hilar adenopathy and left hepatic lobe lesion. Findings were consistent with stage IV primary bronchogenic carcinoma. There were mildly hypermetabolic subcarinal lymph node  versus mild esophageal uptake. There was hepatic steatosis. Head CT on 04/15/2016 was normal.  CEA (2.6) and LDH (128) were normal on 04/05/2016.  She has a history of an extensive left lower extremity thromboses in 12/2012. Because of the extensive clot burden, she underwent TPA with angioplasty on 12/28/2012. An IVC filter was placed on 12/29/2012  and still remains. Hypercoagulable workup was negative for Factor V Leiden, prothrombin gene mutation, lupus anticoagulant, and antithrombin III. She was on Xarelto for 6 months.  She is s/p 1 cycle of carboplatin and Abraxane (04/30/2016 - 05/13/2016).  She is tolerating her chemotherapy well.  Symptomatically, she feels better.  She has some minor musculoskeletal pain.  Her cough has improved.  Her albumen (2.3 to 3.5) and hematocrit (21.9 to 29.5) have improved.  She has gained 3 more pounds.   Potassium is normal (4.1).  WBC is 7200.  Platelet count is 321,000.  Plan: 1.  Labs today:  CBC with diff, CMP, Mg. 2.  Begin cycle #2 carboplatin and Abraxane. 3.  Discuss next cycle (3 weeks on/1 week off or continuous).  Patient will decide as cycle #2 proceeds. 4.  RTC in 1 week for labs (CBC with diff, CMP) and day 8 Abraxane 5.  RTC in 2 weeks for labs (CBC with diff, CMP) and day 15 Abraxane 6.  RTC in 3 weeks  for MD assessment, labs (CBC with diff, CMP, Mg), and cycle #3 carboplatin and Abraxane.   Lequita Asal, MD  05/28/2016, 10:27 AM

## 2016-06-02 ENCOUNTER — Other Ambulatory Visit: Payer: Self-pay | Admitting: Hematology and Oncology

## 2016-06-03 ENCOUNTER — Telehealth: Payer: Self-pay | Admitting: *Deleted

## 2016-06-03 ENCOUNTER — Inpatient Hospital Stay: Payer: BLUE CROSS/BLUE SHIELD

## 2016-06-03 ENCOUNTER — Other Ambulatory Visit: Payer: Self-pay | Admitting: Hematology and Oncology

## 2016-06-03 ENCOUNTER — Other Ambulatory Visit: Payer: Self-pay | Admitting: *Deleted

## 2016-06-03 VITALS — BP 133/78 | HR 94 | Temp 97.8°F | Resp 18

## 2016-06-03 DIAGNOSIS — C3432 Malignant neoplasm of lower lobe, left bronchus or lung: Secondary | ICD-10-CM | POA: Diagnosis not present

## 2016-06-03 DIAGNOSIS — C3492 Malignant neoplasm of unspecified part of left bronchus or lung: Secondary | ICD-10-CM

## 2016-06-03 LAB — COMPREHENSIVE METABOLIC PANEL
ALT: 29 U/L (ref 14–54)
AST: 23 U/L (ref 15–41)
Albumin: 3.6 g/dL (ref 3.5–5.0)
Alkaline Phosphatase: 108 U/L (ref 38–126)
Anion gap: 5 (ref 5–15)
BUN: 16 mg/dL (ref 6–20)
CO2: 27 mmol/L (ref 22–32)
Calcium: 8.8 mg/dL — ABNORMAL LOW (ref 8.9–10.3)
Chloride: 103 mmol/L (ref 101–111)
Creatinine, Ser: 0.31 mg/dL — ABNORMAL LOW (ref 0.44–1.00)
GFR calc Af Amer: >60 mL/min (ref >60)
GFR calc non Af Amer: >60 mL/min (ref >60)
Glucose, Bld: 128 mg/dL — ABNORMAL HIGH (ref 65–99)
Potassium: 3.3 mmol/L — ABNORMAL LOW (ref 3.5–5.1)
Sodium: 135 mmol/L (ref 135–145)
Total Bilirubin: 0.2 mg/dL — ABNORMAL LOW (ref 0.3–1.2)
Total Protein: 6.9 g/dL (ref 6.5–8.1)

## 2016-06-03 LAB — CBC WITH DIFFERENTIAL/PLATELET
Basophils Absolute: 0.1 10*3/uL (ref 0–0.1)
Basophils Relative: 1 %
Eosinophils Absolute: 0.1 10*3/uL (ref 0–0.7)
Eosinophils Relative: 2 %
HCT: 27.2 % — ABNORMAL LOW (ref 35.0–47.0)
Hemoglobin: 9.3 g/dL — ABNORMAL LOW (ref 12.0–16.0)
Lymphocytes Relative: 25 %
Lymphs Abs: 2.2 10*3/uL (ref 1.0–3.6)
MCH: 32.6 pg (ref 26.0–34.0)
MCHC: 34.1 g/dL (ref 32.0–36.0)
MCV: 95.6 fL (ref 80.0–100.0)
Monocytes Absolute: 0.6 10*3/uL (ref 0.2–0.9)
Monocytes Relative: 7 %
Neutro Abs: 5.7 10*3/uL (ref 1.4–6.5)
Neutrophils Relative %: 65 %
Platelets: 275 10*3/uL (ref 150–440)
RBC: 2.85 MIL/uL — ABNORMAL LOW (ref 3.80–5.20)
RDW: 18.6 % — ABNORMAL HIGH (ref 11.5–14.5)
WBC: 8.7 10*3/uL (ref 3.6–11.0)

## 2016-06-03 MED ORDER — HEPARIN SOD (PORK) LOCK FLUSH 100 UNIT/ML IV SOLN
500.0000 [IU] | Freq: Once | INTRAVENOUS | Status: AC | PRN
  Administered 2016-06-03: 500 [IU]
  Filled 2016-06-03: qty 5

## 2016-06-03 MED ORDER — SODIUM CHLORIDE 0.9 % IV SOLN
Freq: Once | INTRAVENOUS | Status: AC
  Administered 2016-06-03: 15:00:00 via INTRAVENOUS
  Filled 2016-06-03: qty 1000

## 2016-06-03 MED ORDER — PACLITAXEL PROTEIN-BOUND CHEMO INJECTION 100 MG
100.0000 mg/m2 | Freq: Once | INTRAVENOUS | Status: AC
  Administered 2016-06-03: 150 mg via INTRAVENOUS
  Filled 2016-06-03: qty 30

## 2016-06-03 MED ORDER — SODIUM CHLORIDE 0.9% FLUSH
10.0000 mL | INTRAVENOUS | Status: DC | PRN
  Administered 2016-06-03: 10 mL
  Filled 2016-06-03: qty 10

## 2016-06-03 MED ORDER — POTASSIUM CHLORIDE ER 10 MEQ PO TBCR: EXTENDED_RELEASE_TABLET | ORAL | Status: DC

## 2016-06-03 MED ORDER — SODIUM CHLORIDE 0.9 % IV SOLN
Freq: Once | INTRAVENOUS | Status: AC
  Administered 2016-06-03: 15:00:00 via INTRAVENOUS
  Filled 2016-06-03: qty 4

## 2016-06-03 NOTE — Telephone Encounter (Signed)
-----   Message from Lequita Asal, MD sent at 06/03/2016  4:00 PM EDT ----- Regarding: Please call patient  She needs to be back on her potassium.  M  ----- Message -----    From: [facility] [place]: 06/03/2016   2:31 PM      To: Lequita Asal, MD

## 2016-06-03 NOTE — Telephone Encounter (Signed)
Per allison in infusion pt is taking 1 pill bid.  When I looked up rx she was to take 2 tablets bid x 1 week and then it was dec. To 1 tablet bid.  I have called her and left message that she now needs to go back up to 2 tablets bid. I also told pt on voicemail that I will send another rx for her potassium to make sure she has enough med. We will recheck level next week. If she has questions to please call.

## 2016-06-04 ENCOUNTER — Ambulatory Visit: Payer: BLUE CROSS/BLUE SHIELD

## 2016-06-04 ENCOUNTER — Other Ambulatory Visit: Payer: BLUE CROSS/BLUE SHIELD

## 2016-06-10 ENCOUNTER — Inpatient Hospital Stay: Payer: BLUE CROSS/BLUE SHIELD

## 2016-06-10 ENCOUNTER — Telehealth: Payer: Self-pay

## 2016-06-10 ENCOUNTER — Other Ambulatory Visit: Payer: Self-pay | Admitting: Hematology and Oncology

## 2016-06-10 VITALS — BP 120/76 | HR 96 | Temp 98.6°F | Resp 18

## 2016-06-10 DIAGNOSIS — C3492 Malignant neoplasm of unspecified part of left bronchus or lung: Secondary | ICD-10-CM

## 2016-06-10 DIAGNOSIS — C3432 Malignant neoplasm of lower lobe, left bronchus or lung: Secondary | ICD-10-CM | POA: Diagnosis not present

## 2016-06-10 LAB — COMPREHENSIVE METABOLIC PANEL
ALT: 17 U/L (ref 14–54)
AST: 18 U/L (ref 15–41)
Albumin: 3.5 g/dL (ref 3.5–5.0)
Alkaline Phosphatase: 89 U/L (ref 38–126)
Anion gap: 6 (ref 5–15)
BUN: 13 mg/dL (ref 6–20)
CO2: 24 mmol/L (ref 22–32)
Calcium: 8.7 mg/dL — ABNORMAL LOW (ref 8.9–10.3)
Chloride: 106 mmol/L (ref 101–111)
Creatinine, Ser: 0.46 mg/dL (ref 0.44–1.00)
GFR calc Af Amer: >60 mL/min (ref >60)
GFR calc non Af Amer: >60 mL/min (ref >60)
Glucose, Bld: 113 mg/dL — ABNORMAL HIGH (ref 65–99)
Potassium: 3.3 mmol/L — ABNORMAL LOW (ref 3.5–5.1)
Sodium: 136 mmol/L (ref 135–145)
Total Bilirubin: 0.2 mg/dL — ABNORMAL LOW (ref 0.3–1.2)
Total Protein: 6.7 g/dL (ref 6.5–8.1)

## 2016-06-10 LAB — CBC WITH DIFFERENTIAL/PLATELET
Basophils Absolute: 0 10*3/uL (ref 0–0.1)
Basophils Relative: 1 %
Eosinophils Absolute: 0.1 10*3/uL (ref 0–0.7)
Eosinophils Relative: 1 %
HCT: 26.6 % — ABNORMAL LOW (ref 35.0–47.0)
Hemoglobin: 9 g/dL — ABNORMAL LOW (ref 12.0–16.0)
Lymphocytes Relative: 28 %
Lymphs Abs: 2.1 10*3/uL (ref 1.0–3.6)
MCH: 32.6 pg (ref 26.0–34.0)
MCHC: 33.8 g/dL (ref 32.0–36.0)
MCV: 96.4 fL (ref 80.0–100.0)
Monocytes Absolute: 0.9 10*3/uL (ref 0.2–0.9)
Monocytes Relative: 12 %
Neutro Abs: 4.5 10*3/uL (ref 1.4–6.5)
Neutrophils Relative %: 58 %
Platelets: 258 10*3/uL (ref 150–440)
RBC: 2.76 MIL/uL — ABNORMAL LOW (ref 3.80–5.20)
RDW: 19.3 % — ABNORMAL HIGH (ref 11.5–14.5)
WBC: 7.5 10*3/uL (ref 3.6–11.0)

## 2016-06-10 MED ORDER — SODIUM CHLORIDE 0.9 % IV SOLN
Freq: Once | INTRAVENOUS | Status: AC
Start: 2016-06-10 — End: 2016-06-10
  Administered 2016-06-10: 14:00:00 via INTRAVENOUS
  Filled 2016-06-10: qty 1000

## 2016-06-10 MED ORDER — HEPARIN SOD (PORK) LOCK FLUSH 100 UNIT/ML IV SOLN
500.0000 [IU] | Freq: Once | INTRAVENOUS | Status: AC | PRN
  Administered 2016-06-10: 500 [IU]
  Filled 2016-06-10: qty 5

## 2016-06-10 MED ORDER — SODIUM CHLORIDE 0.9% FLUSH
10.0000 mL | INTRAVENOUS | Status: DC | PRN
  Filled 2016-06-10: qty 10

## 2016-06-10 MED ORDER — PACLITAXEL PROTEIN-BOUND CHEMO INJECTION 100 MG
100.0000 mg/m2 | Freq: Once | INTRAVENOUS | Status: AC
  Administered 2016-06-10: 150 mg via INTRAVENOUS
  Filled 2016-06-10: qty 30

## 2016-06-10 MED ORDER — SODIUM CHLORIDE 0.9 % IV SOLN
Freq: Once | INTRAVENOUS | Status: AC
  Administered 2016-06-10: 14:00:00 via INTRAVENOUS
  Filled 2016-06-10: qty 4

## 2016-06-10 NOTE — Telephone Encounter (Signed)
Left message on patient mobile number

## 2016-06-10 NOTE — Telephone Encounter (Signed)
-----   Message from Lequita Asal, MD sent at 06/10/2016  3:41 PM EDT ----- Regarding: How much potassium is she taking?  How much K is she taking?  We need to go up.  M ----- Message -----    From: [facility] [place] Interface    Sent: 06/10/2016   1:42 PM      To: Lequita Asal, MD

## 2016-06-10 NOTE — Telephone Encounter (Signed)
Patient called back, she is taking one tablet BID. Per Mike Gip Patient is to take two tablets BID. Advised patient of the change in her dosage she voiced understanding.

## 2016-06-11 ENCOUNTER — Ambulatory Visit: Payer: BLUE CROSS/BLUE SHIELD

## 2016-06-11 ENCOUNTER — Other Ambulatory Visit: Payer: BLUE CROSS/BLUE SHIELD

## 2016-06-16 ENCOUNTER — Other Ambulatory Visit: Payer: Self-pay | Admitting: Hematology and Oncology

## 2016-06-16 NOTE — Progress Notes (Signed)
[facility] day:  06/17/16  Chief Complaint: Christina Mckee is a 55 y.o. female with metastatic squamous cell lung cancer who is seen for assessment prior to day 1 of cycle #3 carboplatin and Abraxane.  HPI: The patient was last seen in the medical oncology clinic on 05/28/2016.  At that time, she was seen prior to cycle #2.   Symptomatically, she felt better.  She had some minor musculoskeletal pain.  Her cough had improved.  Her albumen (2.3 to 3.5) and hematocrit (21.9 to 29.5) had improved.  She had gained 3 more pounds.   Potassium was normal (4.1).  Counts were good.  She proceeded with cycle #2.   During the interim, she has done "as well as I can".  She is taking potassium twice a day.  Weight is stable.  She denies any respiratory symptoms.  She is working full time.   Past Medical History:  Diagnosis Date  . DVT (deep venous thrombosis) ([place])    in 2015  . Menopause    prior to 2014    Past Surgical History:  Procedure Laterality Date  . BREAST CYST EXCISION     left breast removal  . CESAREAN SECTION    . IVC FILTER PLACEMENT ([facility] HX)    . PERIPHERAL VASCULAR CATHETERIZATION N/A 04/28/2016   Procedure: Glori Luis Cath Insertion;  Surgeon: Algernon Huxley, MD;  Location: [facility];  Service: Cardiovascular;  Laterality: N/A;    Family History  Problem Relation Age of Onset  . Breast cancer Mother   . Breast cancer Maternal Aunt   . Stomach cancer Paternal Uncle   . Leukemia Paternal Uncle     Social History:  reports that she has been smoking Cigarettes.  She has been smoking about 0.25 packs per day. She has never used smokeless tobacco. She reports that she drinks alcohol. She reports that she does not use drugs.  She has smoked 1/2-1 pack a day since age 37. She has a 30-40 pack year smoking history.  She drinks 4 beers a day. She lives in [place] alone with her cats and dogs. She has a dog named Production assistant, radio (lab and  shepherd mix).  She previously worked at [employer], but notes all positions were recently eliminated. She continues to work.  Thursdays are better treatment days for her.  The patient is alone today.  Allergies:  Allergies  Allergen Reactions  . Penicillins Hives and Other (See Comments)    Has patient had a PCN reaction causing immediate rash, facial/tongue/throat swelling, SOB or lightheadedness with hypotension: No Has patient had a PCN reaction causing severe rash involving mucus membranes or skin necrosis: No Has patient had a PCN reaction that required hospitalization No Has patient had a PCN reaction occurring within the last 10 years: No If all of the above answers are "NO", then may proceed with Cephalosporin use.    Current Medications: Current Outpatient Prescriptions  Medication Sig Dispense Refill  . guaifenesin (ROBITUSSIN) 100 MG/5ML syrup Take 200 mg by mouth 3 (three) times daily as needed for cough or congestion.    Marland Kitchen guaiFENesin-codeine (CHERATUSSIN AC) 100-10 MG/5ML syrup Take 5 mLs by mouth 3 (three) times daily as needed for cough. 120 mL 0  . ibuprofen (ADVIL,MOTRIN) 200 MG tablet Take by mouth.    . lidocaine-prilocaine (EMLA) cream Apply 1 application topically as needed. 30 g 6  . metoprolol tartrate (LOPRESSOR) 25 MG tablet Take 1  tablet (25 mg total) by mouth 2 (two) times daily. 60 tablet 2  . nicotine (NICODERM CQ - DOSED IN MG/24 HOURS) 21 mg/24hr patch Place 1 patch (21 mg total) onto the skin daily. 28 patch 0  . ondansetron (ZOFRAN) 8 MG tablet Take 1 tablet (8 mg total) by mouth 2 (two) times daily as needed for refractory nausea / vomiting. Start on day 3 after carboplatin chemo. 30 tablet 1  . potassium chloride (K-DUR) 10 MEQ tablet Until pt returns to clinic next week for recheck and will give further instructions. 120 tablet 0  . prochlorperazine (COMPAZINE) 10 MG tablet Take 1 tablet (10 mg total) by mouth every 6 (six) hours as needed for nausea or  vomiting. 30 tablet 6   No current facility-administered medications for this visit.    Facility-Administered Medications Ordered in Other Visits  Medication Dose Route Frequency Provider Last Rate Last Dose  . sodium chloride flush (NS) 0.9 % injection 10 mL  10 mL Intravenous PRN Lequita Asal, MD   10 mL at 04/29/16 1683    Review of Systems:  GENERAL: Feels "as well as I can". No fevers or sweats.  Weight stable PERFORMANCE STATUS (ECOG): 1 HEENT: No visual changes, runny nose, sore throat, mouth sores or tenderness. Lungs: Chronic cough, improved.No increased shortness of breath.  No hemoptysis. Cardiac: No chest pain, palpitations, orthopnea, or PND. GI: Gaining weight.  Eating better.  No vomiting, diarrhea, constipation, melena or hematochezia. GU: No urgency, frequency, dysuria, or hematuria. Musculoskeletal: Left sided musculoskeletal pain.  No back pain. No muscle tenderness. Extremities: Chronic left lower extremity edema. No pain. Skin: Itchy feet, using moisturizer.  No rashes or skin changes. Neuro: Short term memory issues.  No headache, numbness or weakness, balance or coordination issues. Endocrine: No diabetes, thyroid issues, hot flashes or night sweats. Psych: No mood changes, depression or anxiety. Pain: Ankle pain. Review of systems: All other systems reviewed and found to be negative  Physical Exam: Blood pressure 117/64, pulse 100, resp. rate 18, weight 106 lb 11.2 oz (48.4 kg), last menstrual period 04/14/2004. GENERAL: Thin woman sitting comfortably in the exam room in no acute distress. MENTAL STATUS: Alert and oriented to person, place and time. HEAD: Wearing a beige cap.  Thin white hair. Normocephalic, atraumatic, face symmetric, no Cushingoid features. EYES: Blue eyes. Pupils equal round and reactive to light and accomodation. No conjunctivitis or scleral icterus. ENT: Oropharynx clear without lesion. Tongue normal. Mucous  membranes moist.  RESPIRATORY: Clear to auscultation without rales, wheezes or rhonchi. CARDIOVASCULAR: Regular rate and rhythm without murmur, rub or gallop. ABDOMEN: Soft, non-tender, with active bowel sounds, and no hepatosplenomegaly. No masses. SKIN: Spider angiomas on chest.  No rashes, ulcers or lesions. EXTREMITIES: Wearing compression stockings.  No edema.  No skin discoloration or tenderness. No palpable cords. LYMPH NODES: No palpable cervical, supraclavicular, axillary or inguinal adenopathy  NEUROLOGICAL: Appropriate. PSYCH:  Appropriate.   Appointment on 06/17/2016  Component Date Value Ref Range Status  . WBC 06/17/2016 8.9  3.6 - 11.0 K/uL Final  . RBC 06/17/2016 2.88* 3.80 - 5.20 MIL/uL Final  . Hemoglobin 06/17/2016 9.5* 12.0 - 16.0 g/dL Final  . HCT 06/17/2016 27.9* 35.0 - 47.0 % Final  . MCV 06/17/2016 96.9  80.0 - 100.0 fL Final  . MCH 06/17/2016 33.0  26.0 - 34.0 pg Final  . MCHC 06/17/2016 34.1  32.0 - 36.0 g/dL Final  . RDW 06/17/2016 19.4* 11.5 - 14.5 % Final  .  Platelets 06/17/2016 165  150 - 440 K/uL Final  . Neutrophils Relative % 06/17/2016 74  % Final  . Neutro Abs 06/17/2016 6.6* 1.4 - 6.5 K/uL Final  . Lymphocytes Relative 06/17/2016 15  % Final  . Lymphs Abs 06/17/2016 1.3  1.0 - 3.6 K/uL Final  . Monocytes Relative 06/17/2016 10  % Final  . Monocytes Absolute 06/17/2016 0.9  0.2 - 0.9 K/uL Final  . Eosinophils Relative 06/17/2016 0  % Final  . Eosinophils Absolute 06/17/2016 0.0  0 - 0.7 K/uL Final  . Basophils Relative 06/17/2016 1  % Final  . Basophils Absolute 06/17/2016 0.1  0 - 0.1 K/uL Final  . Sodium 06/17/2016 133* 135 - 145 mmol/L Final  . Potassium 06/17/2016 3.3* 3.5 - 5.1 mmol/L Final  . Chloride 06/17/2016 103  101 - 111 mmol/L Final  . CO2 06/17/2016 22  22 - 32 mmol/L Final  . Glucose, Bld 06/17/2016 198* 65 - 99 mg/dL Final  . BUN 06/17/2016 9  6 - 20 mg/dL Final  . Creatinine, Ser 06/17/2016 0.54  0.44 - 1.00 mg/dL Final   . Calcium 06/17/2016 8.5* 8.9 - 10.3 mg/dL Final  . Total Protein 06/17/2016 6.9  6.5 - 8.1 g/dL Final  . Albumin 06/17/2016 3.6  3.5 - 5.0 g/dL Final  . AST 06/17/2016 20  15 - 41 U/L Final  . ALT 06/17/2016 14  14 - 54 U/L Final  . Alkaline Phosphatase 06/17/2016 83  38 - 126 U/L Final  . Total Bilirubin 06/17/2016 0.3  0.3 - 1.2 mg/dL Final  . GFR calc non Af Amer 06/17/2016 >60  >60 mL/min Final  . GFR calc Af Amer 06/17/2016 >60  >60 mL/min Final   Comment: (NOTE) The eGFR has been calculated using the CKD EPI equation. This calculation has not been validated in all clinical situations. eGFR's persistently <60 mL/min signify possible Chronic Kidney Disease.   . Anion gap 06/17/2016 8  5 - 15 Final  . Magnesium 06/17/2016 1.8  1.7 - 2.4 mg/dL Final    Assessment:  Christina Mckee is a 55 y.o. female with metastatic squamous cell lung cancer. Molecular studies were negative studies for EGFR, ALK, and ROS1. PD-L1 was 1%.  Chest CT angiogram on 03/05/2016 revealed 8.7 x 5.7 x 3.3 cm mass in the medial aspect of the superior segment of the left lower lobe extending into the medial aspect of the left upper lobe and into the left hilum. There was vascular and endobronchial encasement and narrowing. There were multiple small left lung nodules. Abdomen and pelvic CT scan revealed diffuse hepatic steatosis and no evidence of metastatic disease in the abdomen or pelvis.  CT guided left lower lobe biopsy on 03/29/2016 revealed moderately differentiated squamous cell carcinoma. CEA and LDH were normal on 04/05/2016.  PET scan on 04/14/2016 revealed hypermetabolic left lower lobe mass with hypermetabolic left hilar adenopathy and left hepatic lobe lesion. Findings were consistent with stage IV primary bronchogenic carcinoma. There were mildly hypermetabolic subcarinal lymph node versus mild esophageal uptake. There was hepatic steatosis. Head CT on 04/15/2016 was normal.  CEA (2.6) and LDH  (128) were normal on 04/05/2016.  She has a history of an extensive left lower extremity thromboses in 12/2012. Because of the extensive clot burden, she underwent TPA with angioplasty on 12/28/2012. An IVC filter was placed on 12/29/2012 and still remains. Hypercoagulable workup was negative for Factor V Leiden, prothrombin gene mutation, lupus anticoagulant, and antithrombin III. She was on  Xarelto for 6 months.  She is s/p 2 cycles of carboplatin and Abraxane (04/30/2016 - 05/28/2016).  She is tolerating her chemotherapy well.  Symptomatically, she feels "ok". Her albumen (2.3 to 3.6) and hematocrit (21.9 to 27.9) have improved.  Weight is stable.   Potassium is low (3.3).  Counts are good.  Plan: 1.  Labs today:  CBC with diff, CMP, Mg. 2.  Begin cycle #3 carboplatin and Abraxane. 3.  Potassium chloride 20 meq IV today. 4.  Continue potassium chloride 20 meq po BID. 5.  Encourage patient to take calcium. 6.  Schedule chest and abdomen CT on 07/05/2016. 7.  RTC in 1 week for labs (CBC with diff, CMP) and day 8 Abraxane 8.  RTC in 2 weeks for labs (CBC with diff, CMP) and day 15 Abraxane 9.  RTC in 3 weeks  for MD assessment, labs (CBC with diff, CMP, Mg), review of CT scans, and cycle #4 carboplatin and Abraxane.   Lequita Asal, MD  06/17/2016, 9:14 AM

## 2016-06-17 ENCOUNTER — Other Ambulatory Visit: Payer: Self-pay | Admitting: *Deleted

## 2016-06-17 ENCOUNTER — Inpatient Hospital Stay: Payer: BLUE CROSS/BLUE SHIELD

## 2016-06-17 ENCOUNTER — Inpatient Hospital Stay (HOSPITAL_BASED_OUTPATIENT_CLINIC_OR_DEPARTMENT_OTHER): Payer: BLUE CROSS/BLUE SHIELD | Admitting: Hematology and Oncology

## 2016-06-17 VITALS — BP 117/64 | HR 100 | Resp 18 | Wt 106.7 lb

## 2016-06-17 VITALS — BP 110/69 | HR 80

## 2016-06-17 DIAGNOSIS — F17210 Nicotine dependence, cigarettes, uncomplicated: Secondary | ICD-10-CM

## 2016-06-17 DIAGNOSIS — C3432 Malignant neoplasm of lower lobe, left bronchus or lung: Secondary | ICD-10-CM | POA: Diagnosis not present

## 2016-06-17 DIAGNOSIS — K760 Fatty (change of) liver, not elsewhere classified: Secondary | ICD-10-CM

## 2016-06-17 DIAGNOSIS — C3492 Malignant neoplasm of unspecified part of left bronchus or lung: Secondary | ICD-10-CM

## 2016-06-17 DIAGNOSIS — E876 Hypokalemia: Secondary | ICD-10-CM

## 2016-06-17 DIAGNOSIS — C787 Secondary malignant neoplasm of liver and intrahepatic bile duct: Secondary | ICD-10-CM

## 2016-06-17 DIAGNOSIS — Z86718 Personal history of other venous thrombosis and embolism: Secondary | ICD-10-CM | POA: Diagnosis not present

## 2016-06-17 DIAGNOSIS — Z79899 Other long term (current) drug therapy: Secondary | ICD-10-CM

## 2016-06-17 DIAGNOSIS — D649 Anemia, unspecified: Secondary | ICD-10-CM

## 2016-06-17 LAB — CBC WITH DIFFERENTIAL/PLATELET
Basophils Absolute: 0.1 10*3/uL (ref 0–0.1)
Basophils Relative: 1 %
Eosinophils Absolute: 0 10*3/uL (ref 0–0.7)
Eosinophils Relative: 0 %
HCT: 27.9 % — ABNORMAL LOW (ref 35.0–47.0)
Hemoglobin: 9.5 g/dL — ABNORMAL LOW (ref 12.0–16.0)
Lymphocytes Relative: 15 %
Lymphs Abs: 1.3 10*3/uL (ref 1.0–3.6)
MCH: 33 pg (ref 26.0–34.0)
MCHC: 34.1 g/dL (ref 32.0–36.0)
MCV: 96.9 fL (ref 80.0–100.0)
Monocytes Absolute: 0.9 10*3/uL (ref 0.2–0.9)
Monocytes Relative: 10 %
Neutro Abs: 6.6 10*3/uL — ABNORMAL HIGH (ref 1.4–6.5)
Neutrophils Relative %: 74 %
Platelets: 165 10*3/uL (ref 150–440)
RBC: 2.88 MIL/uL — ABNORMAL LOW (ref 3.80–5.20)
RDW: 19.4 % — ABNORMAL HIGH (ref 11.5–14.5)
WBC: 8.9 10*3/uL (ref 3.6–11.0)

## 2016-06-17 LAB — COMPREHENSIVE METABOLIC PANEL
ALT: 14 U/L (ref 14–54)
AST: 20 U/L (ref 15–41)
Albumin: 3.6 g/dL (ref 3.5–5.0)
Alkaline Phosphatase: 83 U/L (ref 38–126)
Anion gap: 8 (ref 5–15)
BUN: 9 mg/dL (ref 6–20)
CO2: 22 mmol/L (ref 22–32)
Calcium: 8.5 mg/dL — ABNORMAL LOW (ref 8.9–10.3)
Chloride: 103 mmol/L (ref 101–111)
Creatinine, Ser: 0.54 mg/dL (ref 0.44–1.00)
GFR calc Af Amer: >60 mL/min (ref >60)
GFR calc non Af Amer: >60 mL/min (ref >60)
Glucose, Bld: 198 mg/dL — ABNORMAL HIGH (ref 65–99)
Potassium: 3.3 mmol/L — ABNORMAL LOW (ref 3.5–5.1)
Sodium: 133 mmol/L — ABNORMAL LOW (ref 135–145)
Total Bilirubin: 0.3 mg/dL (ref 0.3–1.2)
Total Protein: 6.9 g/dL (ref 6.5–8.1)

## 2016-06-17 LAB — MAGNESIUM: Magnesium: 1.8 mg/dL (ref 1.7–2.4)

## 2016-06-17 MED ORDER — HEPARIN SOD (PORK) LOCK FLUSH 100 UNIT/ML IV SOLN
500.0000 [IU] | Freq: Once | INTRAVENOUS | Status: AC | PRN
  Administered 2016-06-17: 500 [IU]
  Filled 2016-06-17: qty 5

## 2016-06-17 MED ORDER — SODIUM CHLORIDE 0.9 % IV SOLN
10.0000 mg | Freq: Once | INTRAVENOUS | Status: AC
  Administered 2016-06-17: 10 mg via INTRAVENOUS
  Filled 2016-06-17: qty 1

## 2016-06-17 MED ORDER — PALONOSETRON HCL INJECTION 0.25 MG/5ML
0.2500 mg | Freq: Once | INTRAVENOUS | Status: AC
  Administered 2016-06-17: 0.25 mg via INTRAVENOUS
  Filled 2016-06-17: qty 5

## 2016-06-17 MED ORDER — SODIUM CHLORIDE 0.9 % IV SOLN
INTRAVENOUS | Status: DC
  Administered 2016-06-17: 10:00:00 via INTRAVENOUS
  Filled 2016-06-17 (×2): qty 100

## 2016-06-17 MED ORDER — PACLITAXEL PROTEIN-BOUND CHEMO INJECTION 100 MG
100.0000 mg/m2 | Freq: Once | INTRAVENOUS | Status: AC
  Administered 2016-06-17: 150 mg via INTRAVENOUS
  Filled 2016-06-17: qty 30

## 2016-06-17 MED ORDER — SODIUM CHLORIDE 0.9 % IV SOLN
Freq: Once | INTRAVENOUS | Status: AC
Start: 2016-06-17 — End: 2016-06-17
  Administered 2016-06-17: 10:00:00 via INTRAVENOUS
  Filled 2016-06-17: qty 1000

## 2016-06-17 MED ORDER — SODIUM CHLORIDE 0.9 % IV SOLN
493.8000 mg | Freq: Once | INTRAVENOUS | Status: AC
  Administered 2016-06-17: 490 mg via INTRAVENOUS
  Filled 2016-06-17: qty 49

## 2016-06-17 NOTE — Progress Notes (Signed)
Patient is here for folow up, she notes pain under her ribcage on the left side and sometimes notes it on the right side.   Temp: 99%

## 2016-06-20 ENCOUNTER — Encounter: Payer: Self-pay | Admitting: Hematology and Oncology

## 2016-06-22 ENCOUNTER — Other Ambulatory Visit: Payer: Self-pay | Admitting: Hematology and Oncology

## 2016-06-24 ENCOUNTER — Inpatient Hospital Stay: Payer: BLUE CROSS/BLUE SHIELD

## 2016-06-24 ENCOUNTER — Encounter (INDEPENDENT_AMBULATORY_CARE_PROVIDER_SITE_OTHER): Payer: Self-pay

## 2016-06-24 ENCOUNTER — Inpatient Hospital Stay: Payer: BLUE CROSS/BLUE SHIELD | Attending: Hematology and Oncology

## 2016-06-24 VITALS — BP 108/70 | HR 90 | Temp 98.7°F | Resp 20

## 2016-06-24 DIAGNOSIS — Z5111 Encounter for antineoplastic chemotherapy: Secondary | ICD-10-CM | POA: Diagnosis not present

## 2016-06-24 DIAGNOSIS — C3492 Malignant neoplasm of unspecified part of left bronchus or lung: Secondary | ICD-10-CM

## 2016-06-24 DIAGNOSIS — C3432 Malignant neoplasm of lower lobe, left bronchus or lung: Secondary | ICD-10-CM | POA: Diagnosis present

## 2016-06-24 DIAGNOSIS — K760 Fatty (change of) liver, not elsewhere classified: Secondary | ICD-10-CM | POA: Insufficient documentation

## 2016-06-24 DIAGNOSIS — Z86718 Personal history of other venous thrombosis and embolism: Secondary | ICD-10-CM | POA: Insufficient documentation

## 2016-06-24 DIAGNOSIS — C7951 Secondary malignant neoplasm of bone: Secondary | ICD-10-CM | POA: Insufficient documentation

## 2016-06-24 DIAGNOSIS — C787 Secondary malignant neoplasm of liver and intrahepatic bile duct: Secondary | ICD-10-CM | POA: Insufficient documentation

## 2016-06-24 DIAGNOSIS — E876 Hypokalemia: Secondary | ICD-10-CM | POA: Diagnosis not present

## 2016-06-24 DIAGNOSIS — R197 Diarrhea, unspecified: Secondary | ICD-10-CM | POA: Diagnosis not present

## 2016-06-24 DIAGNOSIS — Z79899 Other long term (current) drug therapy: Secondary | ICD-10-CM | POA: Insufficient documentation

## 2016-06-24 DIAGNOSIS — F17210 Nicotine dependence, cigarettes, uncomplicated: Secondary | ICD-10-CM | POA: Diagnosis not present

## 2016-06-24 LAB — COMPREHENSIVE METABOLIC PANEL
ALT: 18 U/L (ref 14–54)
AST: 20 U/L (ref 15–41)
Albumin: 3.7 g/dL (ref 3.5–5.0)
Alkaline Phosphatase: 79 U/L (ref 38–126)
Anion gap: 9 (ref 5–15)
BUN: 14 mg/dL (ref 6–20)
CO2: 23 mmol/L (ref 22–32)
Calcium: 9 mg/dL (ref 8.9–10.3)
Chloride: 103 mmol/L (ref 101–111)
Creatinine, Ser: 0.4 mg/dL — ABNORMAL LOW (ref 0.44–1.00)
GFR calc Af Amer: >60 mL/min (ref >60)
GFR calc non Af Amer: >60 mL/min (ref >60)
Glucose, Bld: 127 mg/dL — ABNORMAL HIGH (ref 65–99)
Potassium: 3.1 mmol/L — ABNORMAL LOW (ref 3.5–5.1)
Sodium: 135 mmol/L (ref 135–145)
Total Bilirubin: 0.3 mg/dL (ref 0.3–1.2)
Total Protein: 6.7 g/dL (ref 6.5–8.1)

## 2016-06-24 LAB — CBC WITH DIFFERENTIAL/PLATELET
Basophils Absolute: 0 10*3/uL (ref 0–0.1)
Basophils Relative: 0 %
Eosinophils Absolute: 0.1 10*3/uL (ref 0–0.7)
Eosinophils Relative: 1 %
HCT: 26.4 % — ABNORMAL LOW (ref 35.0–47.0)
Hemoglobin: 9.1 g/dL — ABNORMAL LOW (ref 12.0–16.0)
Lymphocytes Relative: 17 %
Lymphs Abs: 1.7 10*3/uL (ref 1.0–3.6)
MCH: 33.2 pg (ref 26.0–34.0)
MCHC: 34.4 g/dL (ref 32.0–36.0)
MCV: 96.7 fL (ref 80.0–100.0)
Monocytes Absolute: 0.6 10*3/uL (ref 0.2–0.9)
Monocytes Relative: 6 %
Neutro Abs: 7.4 10*3/uL — ABNORMAL HIGH (ref 1.4–6.5)
Neutrophils Relative %: 76 %
Platelets: 98 10*3/uL — ABNORMAL LOW (ref 150–440)
RBC: 2.73 MIL/uL — ABNORMAL LOW (ref 3.80–5.20)
RDW: 19.3 % — ABNORMAL HIGH (ref 11.5–14.5)
WBC: 9.7 10*3/uL (ref 3.6–11.0)

## 2016-06-24 MED ORDER — SODIUM CHLORIDE 0.9 % IV SOLN
Freq: Once | INTRAVENOUS | Status: AC
  Administered 2016-06-24: 15:00:00 via INTRAVENOUS
  Filled 2016-06-24: qty 1000

## 2016-06-24 MED ORDER — SODIUM CHLORIDE 0.9 % IV SOLN
Freq: Once | INTRAVENOUS | Status: AC
  Administered 2016-06-24: 15:00:00 via INTRAVENOUS
  Filled 2016-06-24: qty 4

## 2016-06-24 MED ORDER — SODIUM CHLORIDE 0.9% FLUSH
10.0000 mL | INTRAVENOUS | Status: DC | PRN
  Filled 2016-06-24: qty 10

## 2016-06-24 MED ORDER — POTASSIUM CHLORIDE 2 MEQ/ML IV SOLN
Freq: Once | INTRAVENOUS | Status: AC
  Administered 2016-06-24: 15:00:00 via INTRAVENOUS
  Filled 2016-06-24: qty 250

## 2016-06-24 MED ORDER — HEPARIN SOD (PORK) LOCK FLUSH 100 UNIT/ML IV SOLN
500.0000 [IU] | Freq: Once | INTRAVENOUS | Status: AC | PRN
  Administered 2016-06-24: 500 [IU]
  Filled 2016-06-24: qty 5

## 2016-06-24 MED ORDER — PACLITAXEL PROTEIN-BOUND CHEMO INJECTION 100 MG
100.0000 mg/m2 | Freq: Once | INTRAVENOUS | Status: AC
Start: 2016-06-24 — End: 2016-06-24
  Administered 2016-06-24: 150 mg via INTRAVENOUS
  Filled 2016-06-24: qty 30

## 2016-06-24 NOTE — Progress Notes (Unsigned)
Platelet count 98000 today and potassium 3.1. Potassium Ordered and okay to proceed with abraxane per Dr Grayland Ormond

## 2016-07-01 ENCOUNTER — Other Ambulatory Visit: Payer: Self-pay | Admitting: Nurse Practitioner

## 2016-07-01 ENCOUNTER — Inpatient Hospital Stay: Payer: BLUE CROSS/BLUE SHIELD

## 2016-07-01 ENCOUNTER — Other Ambulatory Visit: Payer: Self-pay | Admitting: Hematology and Oncology

## 2016-07-01 VITALS — BP 117/70 | HR 96 | Temp 98.4°F | Resp 18

## 2016-07-01 DIAGNOSIS — C3432 Malignant neoplasm of lower lobe, left bronchus or lung: Secondary | ICD-10-CM | POA: Diagnosis not present

## 2016-07-01 DIAGNOSIS — C3492 Malignant neoplasm of unspecified part of left bronchus or lung: Secondary | ICD-10-CM

## 2016-07-01 LAB — COMPREHENSIVE METABOLIC PANEL
ALT: 13 U/L — ABNORMAL LOW (ref 14–54)
AST: 16 U/L (ref 15–41)
Albumin: 3.5 g/dL (ref 3.5–5.0)
Alkaline Phosphatase: 65 U/L (ref 38–126)
Anion gap: 8 (ref 5–15)
BUN: 11 mg/dL (ref 6–20)
CO2: 25 mmol/L (ref 22–32)
Calcium: 9 mg/dL (ref 8.9–10.3)
Chloride: 103 mmol/L (ref 101–111)
Creatinine, Ser: 0.41 mg/dL — ABNORMAL LOW (ref 0.44–1.00)
GFR calc Af Amer: >60 mL/min (ref >60)
GFR calc non Af Amer: >60 mL/min (ref >60)
Glucose, Bld: 142 mg/dL — ABNORMAL HIGH (ref 65–99)
Potassium: 3 mmol/L — ABNORMAL LOW (ref 3.5–5.1)
Sodium: 136 mmol/L (ref 135–145)
Total Bilirubin: 0.2 mg/dL — ABNORMAL LOW (ref 0.3–1.2)
Total Protein: 6.7 g/dL (ref 6.5–8.1)

## 2016-07-01 LAB — CBC WITH DIFFERENTIAL/PLATELET
Basophils Absolute: 0 10*3/uL (ref 0–0.1)
Basophils Relative: 0 %
Eosinophils Absolute: 0 10*3/uL (ref 0–0.7)
Eosinophils Relative: 0 %
HCT: 24.8 % — ABNORMAL LOW (ref 35.0–47.0)
Hemoglobin: 8.4 g/dL — ABNORMAL LOW (ref 12.0–16.0)
Lymphocytes Relative: 34 %
Lymphs Abs: 1.7 10*3/uL (ref 1.0–3.6)
MCH: 33.7 pg (ref 26.0–34.0)
MCHC: 34.1 g/dL (ref 32.0–36.0)
MCV: 98.8 fL (ref 80.0–100.0)
Monocytes Absolute: 0.5 10*3/uL (ref 0.2–0.9)
Monocytes Relative: 11 %
Neutro Abs: 2.7 10*3/uL (ref 1.4–6.5)
Neutrophils Relative %: 55 %
Platelets: 151 10*3/uL (ref 150–440)
RBC: 2.51 MIL/uL — ABNORMAL LOW (ref 3.80–5.20)
RDW: 19.1 % — ABNORMAL HIGH (ref 11.5–14.5)
WBC: 5 10*3/uL (ref 3.6–11.0)

## 2016-07-01 MED ORDER — HEPARIN SOD (PORK) LOCK FLUSH 100 UNIT/ML IV SOLN
500.0000 [IU] | Freq: Once | INTRAVENOUS | Status: AC | PRN
  Administered 2016-07-01: 500 [IU]

## 2016-07-01 MED ORDER — PACLITAXEL PROTEIN-BOUND CHEMO INJECTION 100 MG
100.0000 mg/m2 | Freq: Once | INTRAVENOUS | Status: AC
  Administered 2016-07-01: 150 mg via INTRAVENOUS
  Filled 2016-07-01: qty 30

## 2016-07-01 MED ORDER — SODIUM CHLORIDE 0.9 % IV SOLN
Freq: Once | INTRAVENOUS | Status: AC
  Administered 2016-07-01: 14:00:00 via INTRAVENOUS
  Filled 2016-07-01: qty 1000

## 2016-07-01 MED ORDER — POTASSIUM CHLORIDE ER 10 MEQ PO TBCR: EXTENDED_RELEASE_TABLET | ORAL | 0 refills | Status: DC

## 2016-07-01 MED ORDER — SODIUM CHLORIDE 0.9 % IV SOLN
Freq: Once | INTRAVENOUS | Status: AC
  Administered 2016-07-01: 16:00:00 via INTRAVENOUS
  Filled 2016-07-01: qty 100

## 2016-07-01 MED ORDER — SODIUM CHLORIDE 0.9 % IV SOLN
Freq: Once | INTRAVENOUS | Status: AC
  Administered 2016-07-01: 15:00:00 via INTRAVENOUS
  Filled 2016-07-01: qty 4

## 2016-07-01 MED ORDER — HEPARIN SOD (PORK) LOCK FLUSH 100 UNIT/ML IV SOLN
INTRAVENOUS | Status: AC
  Filled 2016-07-01: qty 5

## 2016-07-05 ENCOUNTER — Ambulatory Visit
Admission: RE | Admit: 2016-07-05 | Discharge: 2016-07-05 | Disposition: A | Discharge status: Home / Self Care | Payer: BLUE CROSS/BLUE SHIELD | Source: Ambulatory Visit | Attending: Hematology and Oncology | Admitting: Hematology and Oncology

## 2016-07-05 DIAGNOSIS — M899 Disorder of bone, unspecified: Secondary | ICD-10-CM | POA: Insufficient documentation

## 2016-07-05 DIAGNOSIS — C787 Secondary malignant neoplasm of liver and intrahepatic bile duct: Secondary | ICD-10-CM

## 2016-07-05 DIAGNOSIS — N289 Disorder of kidney and ureter, unspecified: Secondary | ICD-10-CM | POA: Insufficient documentation

## 2016-07-05 DIAGNOSIS — C3492 Malignant neoplasm of unspecified part of left bronchus or lung: Secondary | ICD-10-CM | POA: Diagnosis present

## 2016-07-05 DIAGNOSIS — K8020 Calculus of gallbladder without cholecystitis without obstruction: Secondary | ICD-10-CM | POA: Diagnosis not present

## 2016-07-05 DIAGNOSIS — C7951 Secondary malignant neoplasm of bone: Secondary | ICD-10-CM | POA: Insufficient documentation

## 2016-07-05 HISTORY — DX: Malignant neoplasm of unspecified part of unspecified bronchus or lung: C34.90

## 2016-07-05 MED ORDER — IOPAMIDOL (ISOVUE-300) INJECTION 61%
85.0000 mL | Freq: Once | INTRAVENOUS | Status: AC | PRN
  Administered 2016-07-05: 85 mL via INTRAVENOUS

## 2016-07-07 NOTE — Progress Notes (Addendum)
[facility] day:  07/08/2016  Chief Complaint: Christina Mckee is a 55 y.o. female with metastatic squamous cell lung cancer who is seen for review of interval scans and assessment prior to cycle #4 carboplatin and Abraxane.  HPI: The patient was last seen in the medical oncology clinic on 06/17/2016.  At that time, she was seen prior to cycle #3.   She felt "ok".  Weight was stable.   Her nutritional status had improved (albumen).  Hematocrit had improved.  Potassium was low (3.3).  Potassium chloride was increased to 20 meq po BID.  Chest, abdomen, and pelvic CT scan on 07/05/2016 revealed response to therapy of dominant left lower lobe lung mass (5.7 x 3.3 cm to 5.5 x 2.3 cm).  There was no thoracic adenopathy.  Liver metastasis was likely progressive (3.4 x 2 cm; relatively CT occult on the prior exam).  There was development of multiple bilateral renal lesions, consistent with renal metastasis.  There was new anterior L3 lucent and sclerotic lesion, suspicious for osseous metastasis.  Symptomatically, she notes a dry hacking cough.  She has had some left sided chest discomfort associated with her disease.  She denies any pleuritic chest pain or lower extremity edema.   Past Medical History:  Diagnosis Date  . DVT (deep venous thrombosis) ([place])    in 2015  . Menopause    prior to 2014  . Squamous cell lung cancer [facility])     Past Surgical History:  Procedure Laterality Date  . BREAST CYST EXCISION     left breast removal  . CESAREAN SECTION    . IVC FILTER PLACEMENT ([facility] HX)    . PERIPHERAL VASCULAR CATHETERIZATION N/A 04/28/2016   Procedure: Glori Luis Cath Insertion;  Surgeon: Algernon Huxley, MD;  Location: [facility];  Service: Cardiovascular;  Laterality: N/A;    Family History  Problem Relation Age of Onset  . Breast cancer Mother   . Breast cancer Maternal Aunt   . Stomach cancer Paternal Uncle   . Leukemia Paternal Uncle      Social History:  reports that she has been smoking Cigarettes.  She has been smoking about 0.25 packs per day. She has never used smokeless tobacco. She reports that she drinks alcohol. She reports that she does not use drugs.  She has smoked 1/2-1 pack a day since age 20. She has a 30-40 pack year smoking history.  She drinks 4 beers a day. She lives in [place] alone with her cats and dogs. She has a dog named Production assistant, radio (lab and shepherd mix).  She previously worked at [employer], but notes all positions were recently eliminated. She continues to work.  Thursdays are better treatment days for her.  The patient is alone today.  Allergies:  Allergies  Allergen Reactions  . Penicillins Hives and Other (See Comments)    Has patient had a PCN reaction causing immediate rash, facial/tongue/throat swelling, SOB or lightheadedness with hypotension: No Has patient had a PCN reaction causing severe rash involving mucus membranes or skin necrosis: No Has patient had a PCN reaction that required hospitalization No Has patient had a PCN reaction occurring within the last 10 years: No If all of the above answers are "NO", then may proceed with Cephalosporin use.    Current Medications: Current Outpatient Prescriptions  Medication Sig Dispense Refill  . guaifenesin (ROBITUSSIN) 100 MG/5ML syrup Take 200 mg by mouth 3 (three) times daily as needed  for cough or congestion.    Marland Kitchen guaiFENesin-codeine (CHERATUSSIN AC) 100-10 MG/5ML syrup Take 5 mLs by mouth 3 (three) times daily as needed for cough. 120 mL 0  . ibuprofen (ADVIL,MOTRIN) 200 MG tablet Take by mouth.    . lidocaine-prilocaine (EMLA) cream Apply 1 application topically as needed. 30 g 6  . metoprolol tartrate (LOPRESSOR) 25 MG tablet Take 1 tablet (25 mg total) by mouth 2 (two) times daily. 60 tablet 2  . nicotine (NICODERM CQ - DOSED IN MG/24 HOURS) 21 mg/24hr patch Place 1 patch (21 mg total) onto the skin daily. 28 patch 0  . ondansetron (ZOFRAN)  8 MG tablet Take 1 tablet (8 mg total) by mouth 2 (two) times daily as needed for refractory nausea / vomiting. Start on day 3 after carboplatin chemo. 30 tablet 1  . potassium chloride (K-DUR) 10 MEQ tablet Take 3 tabs PO in the morning and 2 tabs PO in the evening 150 tablet 0  . prochlorperazine (COMPAZINE) 10 MG tablet Take 1 tablet (10 mg total) by mouth every 6 (six) hours as needed for nausea or vomiting. 30 tablet 6   No current facility-administered medications for this visit.    Facility-Administered Medications Ordered in Other Visits  Medication Dose Route Frequency Provider Last Rate Last Dose  . sodium chloride flush (NS) 0.9 % injection 10 mL  10 mL Intravenous PRN Lequita Asal, MD   10 mL at 04/29/16 9470    Review of Systems:  GENERAL: Feels "ok". No fevers or sweats.  Weight stable PERFORMANCE STATUS (ECOG): 1 HEENT: No visual changes, runny nose, sore throat, mouth sores or tenderness. Lungs: Dry hacking cough with left sided chest discomfort.No increased shortness of breath.  No hemoptysis. Cardiac: No chest pain, palpitations, orthopnea, or PND. GI: Appetite 25%.  Diarrhea after CT.  No vomiting, constipation, melena or hematochezia. GU: No urgency, frequency, dysuria, or hematuria. Musculoskeletal: Left sided musculoskeletal pain.  No back pain. No muscle tenderness. Extremities: Chronic left lower extremity edema. No pain. Skin: Itchy skin.  No rashes or skin changes. Neuro: Short term memory issues.  No headache, numbness or weakness, balance or coordination issues. Endocrine: No diabetes, thyroid issues, hot flashes or night sweats. Psych: No mood changes, depression or anxiety. Pain: Ankle pain. Review of systems: All other systems reviewed and found to be negative  Physical Exam: Last menstrual period 04/14/2004. GENERAL: Thin woman sitting comfortably in the exam room in no acute distress.  She is tearful. MENTAL STATUS: Alert and  oriented to person, place and time. HEAD: Normocephalic, atraumatic, face symmetric, no Cushingoid features. EYES: Blue eyes. Pupils equal round and reactive to light and accomodation. No conjunctivitis or scleral icterus. ENT: Oropharynx clear without lesion. Tongue normal. Mucous membranes moist.  RESPIRATORY: Clear to auscultation without rales, wheezes or rhonchi. CARDIOVASCULAR: Regular rate and rhythm without murmur, rub or gallop. ABDOMEN: Soft, non-tender, with active bowel sounds, and no hepatosplenomegaly. No masses. SKIN: Spider angiomas on chest.  No rashes, ulcers or lesions. EXTREMITIES: No edema, skin discoloration or tenderness. No palpable cords. LYMPH NODES: No palpable cervical, supraclavicular, axillary or inguinal adenopathy  NEUROLOGICAL: Appropriate. PSYCH:  Appropriate.   No visits with results within 3 Day(s) from this visit.  Latest known visit with results is:  Infusion on 07/01/2016  Component Date Value Ref Range Status  . WBC 07/01/2016 5.0  3.6 - 11.0 K/uL Final  . RBC 07/01/2016 2.51* 3.80 - 5.20 MIL/uL Final  . Hemoglobin 07/01/2016 8.4* 12.0 -  16.0 g/dL Final  . HCT 07/01/2016 24.8* 35.0 - 47.0 % Final  . MCV 07/01/2016 98.8  80.0 - 100.0 fL Final  . MCH 07/01/2016 33.7  26.0 - 34.0 pg Final  . MCHC 07/01/2016 34.1  32.0 - 36.0 g/dL Final  . RDW 07/01/2016 19.1* 11.5 - 14.5 % Final  . Platelets 07/01/2016 151  150 - 440 K/uL Final  . Neutrophils Relative % 07/01/2016 55  % Final  . Neutro Abs 07/01/2016 2.7  1.4 - 6.5 K/uL Final  . Lymphocytes Relative 07/01/2016 34  % Final  . Lymphs Abs 07/01/2016 1.7  1.0 - 3.6 K/uL Final  . Monocytes Relative 07/01/2016 11  % Final  . Monocytes Absolute 07/01/2016 0.5  0.2 - 0.9 K/uL Final  . Eosinophils Relative 07/01/2016 0  % Final  . Eosinophils Absolute 07/01/2016 0.0  0 - 0.7 K/uL Final  . Basophils Relative 07/01/2016 0  % Final  . Basophils Absolute 07/01/2016 0.0  0 - 0.1 K/uL Final  .  Sodium 07/01/2016 136  135 - 145 mmol/L Final  . Potassium 07/01/2016 3.0* 3.5 - 5.1 mmol/L Final  . Chloride 07/01/2016 103  101 - 111 mmol/L Final  . CO2 07/01/2016 25  22 - 32 mmol/L Final  . Glucose, Bld 07/01/2016 142* 65 - 99 mg/dL Final  . BUN 07/01/2016 11  6 - 20 mg/dL Final  . Creatinine, Ser 07/01/2016 0.41* 0.44 - 1.00 mg/dL Final  . Calcium 07/01/2016 9.0  8.9 - 10.3 mg/dL Final  . Total Protein 07/01/2016 6.7  6.5 - 8.1 g/dL Final  . Albumin 07/01/2016 3.5  3.5 - 5.0 g/dL Final  . AST 07/01/2016 16  15 - 41 U/L Final  . ALT 07/01/2016 13* 14 - 54 U/L Final  . Alkaline Phosphatase 07/01/2016 65  38 - 126 U/L Final  . Total Bilirubin 07/01/2016 0.2* 0.3 - 1.2 mg/dL Final  . GFR calc non Af Amer 07/01/2016 >60  >60 mL/min Final  . GFR calc Af Amer 07/01/2016 >60  >60 mL/min Final   Comment: (NOTE) The eGFR has been calculated using the CKD EPI equation. This calculation has not been validated in all clinical situations. eGFR's persistently <60 mL/min signify possible Chronic Kidney Disease.   . Anion gap 07/01/2016 8  5 - 15 Final    Assessment:  Christina Mckee is a 55 y.o. female with metastatic squamous cell lung cancer. Molecular studies were negative studies for EGFR, ALK, and ROS1. PD-L1 was 1%.  Chest CT angiogram on 03/05/2016 revealed 8.7 x 5.7 x 3.3 cm mass in the medial aspect of the superior segment of the left lower lobe extending into the medial aspect of the left upper lobe and into the left hilum. There was vascular and endobronchial encasement and narrowing. There were multiple small left lung nodules. Abdomen and pelvic CT scan revealed diffuse hepatic steatosis and no evidence of metastatic disease in the abdomen or pelvis.  CT guided left lower lobe biopsy on 03/29/2016 revealed moderately differentiated squamous cell carcinoma. CEA and LDH were normal on 04/05/2016.  PET scan on 04/14/2016 revealed hypermetabolic left lower lobe mass with  hypermetabolic left hilar adenopathy and left hepatic lobe lesion. Findings were consistent with stage IV primary bronchogenic carcinoma. There were mildly hypermetabolic subcarinal lymph node versus mild esophageal uptake. There was hepatic steatosis. Head CT on 04/15/2016 was normal.  CEA (2.6) and LDH (128) were normal on 04/05/2016.  She has a history of an extensive left  lower extremity thromboses in 12/2012. Because of the extensive clot burden, she underwent TPA with angioplasty on 12/28/2012. An IVC filter was placed on 12/29/2012 and still remains. Hypercoagulable workup was negative for Factor V Leiden, prothrombin gene mutation, lupus anticoagulant, and antithrombin III. She was on Xarelto for 6 months.  She received 3 cycles of carboplatin and Abraxane (04/30/2016 - 06/17/2016).  Chest, abdomen, and pelvic CT scan on 07/05/2016 revealed a mixed response.  There was response to therapy of dominant left lower lobe lung mass (5.7 x 3.3 cm to 5.5 x 2.3 cm).  Liver metastasis was likely progressive (3.4 x 2 cm).  There was development of multiple bilateral renal lesions.  There was new anterior L3 lucent and sclerotic lesion.  Symptomatically, she has a dry hacky cough.  She has had some diarrhea.  Exam is stable.  Plan: 1.  Labs today:  CBC with diff, CMP, Mg. 2.  Review CT scans.  Discuss progressive disease.  Discuss discontinuation of carboplatin and Abraxane.  Discuss second line therapy.  Discuss Opdibo (nivolumab).    Discuss side effects of anti PD-L1 monoclonal antibody (check point inhibitor).  Discuss treatment based on results of CheckMate 017 trial (nivolumab versus Taxotere in patients with advanced squamous NSCLC that progressed after a platinum-based doublet).  Overall survival (9 months versus 6 months), 1 year survival (42% versus 24%) and response rate (20% versus 9%) were improved with nivolumab versus Taxotere.  PD-L1 tumor expression did not appear to influence survival  benefit.   3.  Discontinue carboplatin and Abraxane. 4.  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  Side effects reviewed.  Discuss dental evaluation.  5.  Preauth Nivolumab and Xgeva.  Information provided on medications. 6.  RTC in 1 week for MD assessment, labs (CBC with diff, CMP, Mg, TSH, ACTH), Opdivo and Xgeva  Addendum:  After the patient's appointment, the patient was noted to be eligible for SWOG S1400:  Phase II/III Biomarker driven master protocol for 2nd line squamous cell, stage IIIB/IV, 1 prior platinum regimen; testing for eligibility for S1400I: nivolumab +/- ipilumumab (stage IV).  The patient and protocol nurse were contacted.    Lequita Asal, MD  07/08/2016

## 2016-07-08 ENCOUNTER — Inpatient Hospital Stay: Payer: BLUE CROSS/BLUE SHIELD

## 2016-07-08 ENCOUNTER — Encounter (INDEPENDENT_AMBULATORY_CARE_PROVIDER_SITE_OTHER): Payer: Self-pay

## 2016-07-08 ENCOUNTER — Inpatient Hospital Stay (HOSPITAL_BASED_OUTPATIENT_CLINIC_OR_DEPARTMENT_OTHER): Payer: BLUE CROSS/BLUE SHIELD | Admitting: Hematology and Oncology

## 2016-07-08 VITALS — BP 126/71 | HR 101 | Temp 96.4°F | Resp 18 | Wt 106.9 lb

## 2016-07-08 DIAGNOSIS — Z86718 Personal history of other venous thrombosis and embolism: Secondary | ICD-10-CM

## 2016-07-08 DIAGNOSIS — C3492 Malignant neoplasm of unspecified part of left bronchus or lung: Secondary | ICD-10-CM

## 2016-07-08 DIAGNOSIS — C787 Secondary malignant neoplasm of liver and intrahepatic bile duct: Secondary | ICD-10-CM

## 2016-07-08 DIAGNOSIS — Z79899 Other long term (current) drug therapy: Secondary | ICD-10-CM

## 2016-07-08 DIAGNOSIS — K760 Fatty (change of) liver, not elsewhere classified: Secondary | ICD-10-CM

## 2016-07-08 DIAGNOSIS — F17210 Nicotine dependence, cigarettes, uncomplicated: Secondary | ICD-10-CM

## 2016-07-08 DIAGNOSIS — C7951 Secondary malignant neoplasm of bone: Secondary | ICD-10-CM

## 2016-07-08 DIAGNOSIS — C3432 Malignant neoplasm of lower lobe, left bronchus or lung: Secondary | ICD-10-CM | POA: Diagnosis not present

## 2016-07-08 LAB — CBC WITH DIFFERENTIAL/PLATELET
Basophils Absolute: 0 10*3/uL (ref 0–0.1)
Basophils Relative: 0 %
Eosinophils Absolute: 0 10*3/uL (ref 0–0.7)
Eosinophils Relative: 0 %
HCT: 26.4 % — ABNORMAL LOW (ref 35.0–47.0)
Hemoglobin: 9.2 g/dL — ABNORMAL LOW (ref 12.0–16.0)
Lymphocytes Relative: 24 %
Lymphs Abs: 1.4 10*3/uL (ref 1.0–3.6)
MCH: 34.5 pg — ABNORMAL HIGH (ref 26.0–34.0)
MCHC: 34.7 g/dL (ref 32.0–36.0)
MCV: 99.2 fL (ref 80.0–100.0)
Monocytes Absolute: 0.6 10*3/uL (ref 0.2–0.9)
Monocytes Relative: 9 %
Neutro Abs: 4 10*3/uL (ref 1.4–6.5)
Neutrophils Relative %: 67 %
Platelets: 181 10*3/uL (ref 150–440)
RBC: 2.66 MIL/uL — ABNORMAL LOW (ref 3.80–5.20)
RDW: 18.9 % — ABNORMAL HIGH (ref 11.5–14.5)
WBC: 6.1 10*3/uL (ref 3.6–11.0)

## 2016-07-08 LAB — COMPREHENSIVE METABOLIC PANEL
ALT: 13 U/L — ABNORMAL LOW (ref 14–54)
AST: 19 U/L (ref 15–41)
Albumin: 3.7 g/dL (ref 3.5–5.0)
Alkaline Phosphatase: 64 U/L (ref 38–126)
Anion gap: 8 (ref 5–15)
BUN: <5 mg/dL — ABNORMAL LOW (ref 6–20)
CO2: 25 mmol/L (ref 22–32)
Calcium: 8.8 mg/dL — ABNORMAL LOW (ref 8.9–10.3)
Chloride: 101 mmol/L (ref 101–111)
Creatinine, Ser: 0.49 mg/dL (ref 0.44–1.00)
GFR calc Af Amer: >60 mL/min (ref >60)
GFR calc non Af Amer: >60 mL/min (ref >60)
Glucose, Bld: 143 mg/dL — ABNORMAL HIGH (ref 65–99)
Potassium: 3.6 mmol/L (ref 3.5–5.1)
Sodium: 134 mmol/L — ABNORMAL LOW (ref 135–145)
Total Bilirubin: 0.3 mg/dL (ref 0.3–1.2)
Total Protein: 6.6 g/dL (ref 6.5–8.1)

## 2016-07-08 LAB — MAGNESIUM: Magnesium: 1.6 mg/dL — ABNORMAL LOW (ref 1.7–2.4)

## 2016-07-08 MED ORDER — SODIUM CHLORIDE 0.9% FLUSH
10.0000 mL | INTRAVENOUS | Status: DC | PRN
  Administered 2016-07-08: 10 mL via INTRAVENOUS
  Filled 2016-07-08: qty 10

## 2016-07-08 MED ORDER — HEPARIN SOD (PORK) LOCK FLUSH 100 UNIT/ML IV SOLN
500.0000 [IU] | Freq: Once | INTRAVENOUS | Status: AC
  Administered 2016-07-08: 500 [IU] via INTRAVENOUS
  Filled 2016-07-08: qty 5

## 2016-07-08 NOTE — Patient Instructions (Addendum)
Nivolumab injection What is this medicine? NIVOLUMAB (nye VOL ue mab) is a monoclonal antibody. It is used to treat melanoma, lung cancer, kidney cancer, and Hodgkin lymphoma. This medicine may be used for other purposes; ask your health care provider or pharmacist if you have questions. What should I tell my health care provider before I take this medicine? They need to know if you have any of these conditions: -diabetes -immune system problems -kidney disease -liver disease -lung disease -organ transplant -stomach or intestine problems -thyroid disease -an unusual or allergic reaction to nivolumab, other medicines, foods, dyes, or preservatives -pregnant or trying to get pregnant -breast-feeding How should I use this medicine? This medicine is for infusion into a vein. It is given by a health care professional in a hospital or clinic setting. A special MedGuide will be given to you before each treatment. Be sure to read this information carefully each time. Talk to your pediatrician regarding the use of this medicine in children. Special care may be needed. Overdosage: If you think you have taken too much of this medicine contact a poison control center or emergency room at once. NOTE: This medicine is only for you. Do not share this medicine with others. What if I miss a dose? It is important not to miss your dose. Call your doctor or health care professional if you are unable to keep an appointment. What may interact with this medicine? Interactions have not been studied. Give your health care provider a list of all the medicines, herbs, non-prescription drugs, or dietary supplements you use. Also tell them if you smoke, drink alcohol, or use illegal drugs. Some items may interact with your medicine. This list may not describe all possible interactions. Give your health care provider a list of all the medicines, herbs, non-prescription drugs, or dietary supplements you use. Also tell  them if you smoke, drink alcohol, or use illegal drugs. Some items may interact with your medicine. What should I watch for while using this medicine? This drug may make you feel generally unwell. Continue your course of treatment even though you feel ill unless your doctor tells you to stop. You may need blood work done while you are taking this medicine. Do not become pregnant while taking this medicine or for 5 months after stopping it. Women should inform their doctor if they wish to become pregnant or think they might be pregnant. There is a potential for serious side effects to an unborn child. Talk to your health care professional or pharmacist for more information. Do not breast-feed an infant while taking this medicine. What side effects may I notice from receiving this medicine? Side effects that you should report to your doctor or health care professional as soon as possible: -allergic reactions like skin rash, itching or hives, swelling of the face, lips, or tongue -black, tarry stools -blood in the urine -bloody or watery diarrhea -changes in vision -change in sex drive -changes in emotions or moods -chest pain -confusion -cough -decreased appetite -diarrhea -facial flushing -feeling faint or lightheaded -fever, chills -hair loss -hallucination, loss of contact with reality -headache -irritable -joint pain -loss of memory -muscle pain -muscle weakness -seizures -shortness of breath -signs and symptoms of high blood sugar such as dizziness; dry mouth; dry skin; fruity breath; nausea; stomach pain; increased hunger or thirst; increased urination -signs and symptoms of kidney injury like trouble passing urine or change in the amount of urine -signs and symptoms of liver injury like dark yellow or  brown urine; general ill feeling or flu-like symptoms; light-colored stools; loss of appetite; nausea; right upper belly pain; unusually weak or tired; yellowing of the eyes or  skin -stiff neck -swelling of the ankles, feet, hands -weight gain Side effects that usually do not require medical attention (report to your doctor or health care professional if they continue or are bothersome): -bone pain -constipation -tiredness -vomiting This list may not describe all possible side effects. Call your doctor for medical advice about side effects. You may report side effects to FDA at 1-800-FDA-1088. Where should I keep my medicine? This drug is given in a hospital or clinic and will not be stored at home. NOTE: This sheet is a summary. It may not cover all possible information. If you have questions about this medicine, talk to your doctor, pharmacist, or health care provider.    2016, Elsevier/Gold Standard. (2015-04-09 10:03:42) Denosumab injection What is this medicine? DENOSUMAB (den oh sue mab) slows bone breakdown. Prolia is used to treat osteoporosis in women after menopause and in men. Delton See is used to prevent bone fractures and other bone problems caused by cancer bone metastases. Delton See is also used to treat giant cell tumor of the bone. This medicine may be used for other purposes; ask your health care provider or pharmacist if you have questions. What should I tell my health care provider before I take this medicine? They need to know if you have any of these conditions: -dental disease -eczema -infection or history of infections -kidney disease or on dialysis -low blood calcium or vitamin D -malabsorption syndrome -scheduled to have surgery or tooth extraction -taking medicine that contains denosumab -thyroid or parathyroid disease -an unusual reaction to denosumab, other medicines, foods, dyes, or preservatives -pregnant or trying to get pregnant -breast-feeding How should I use this medicine? This medicine is for injection under the skin. It is given by a health care professional in a hospital or clinic setting. If you are getting Prolia, a  special MedGuide will be given to you by the pharmacist with each prescription and refill. Be sure to read this information carefully each time. For Prolia, talk to your pediatrician regarding the use of this medicine in children. Special care may be needed. For Delton See, talk to your pediatrician regarding the use of this medicine in children. While this drug may be prescribed for children as young as 13 years for selected conditions, precautions do apply. Overdosage: If you think you have taken too much of this medicine contact a poison control center or emergency room at once. NOTE: This medicine is only for you. Do not share this medicine with others. What if I miss a dose? It is important not to miss your dose. Call your doctor or health care professional if you are unable to keep an appointment. What may interact with this medicine? Do not take this medicine with any of the following medications: -other medicines containing denosumab This medicine may also interact with the following medications: -medicines that suppress the immune system -medicines that treat cancer -steroid medicines like prednisone or cortisone This list may not describe all possible interactions. Give your health care provider a list of all the medicines, herbs, non-prescription drugs, or dietary supplements you use. Also tell them if you smoke, drink alcohol, or use illegal drugs. Some items may interact with your medicine. What should I watch for while using this medicine? Visit your doctor or health care professional for regular checks on your progress. Your doctor or health  care professional may order blood tests and other tests to see how you are doing. Call your doctor or health care professional if you get a cold or other infection while receiving this medicine. Do not treat yourself. This medicine may decrease your body's ability to fight infection. You should make sure you get enough calcium and vitamin D while you  are taking this medicine, unless your doctor tells you not to. Discuss the foods you eat and the vitamins you take with your health care professional. See your dentist regularly. Brush and floss your teeth as directed. Before you have any dental work done, tell your dentist you are receiving this medicine. Do not become pregnant while taking this medicine or for 5 months after stopping it. Women should inform their doctor if they wish to become pregnant or think they might be pregnant. There is a potential for serious side effects to an unborn child. Talk to your health care professional or pharmacist for more information. What side effects may I notice from receiving this medicine? Side effects that you should report to your doctor or health care professional as soon as possible: -allergic reactions like skin rash, itching or hives, swelling of the face, lips, or tongue -breathing problems -chest pain -fast, irregular heartbeat -feeling faint or lightheaded, falls -fever, chills, or any other sign of infection -muscle spasms, tightening, or twitches -numbness or tingling -skin blisters or bumps, or is dry, peels, or red -slow healing or unexplained pain in the mouth or jaw -unusual bleeding or bruising Side effects that usually do not require medical attention (Report these to your doctor or health care professional if they continue or are bothersome.): -muscle pain -stomach upset, gas This list may not describe all possible side effects. Call your doctor for medical advice about side effects. You may report side effects to FDA at 1-800-FDA-1088. Where should I keep my medicine? This medicine is only given in a clinic, doctor's office, or other health care setting and will not be stored at home. NOTE: This sheet is a summary. It may not cover all possible information. If you have questions about this medicine, talk to your doctor, pharmacist, or health care provider.    2016, Elsevier/Gold  Standard. (2012-05-08 12:37:47)

## 2016-07-08 NOTE — Progress Notes (Signed)
Patient is here today for follow up, she mentions some diarrhea, and dry heaving

## 2016-07-09 ENCOUNTER — Encounter: Payer: Self-pay | Admitting: *Deleted

## 2016-07-09 ENCOUNTER — Other Ambulatory Visit: Payer: Self-pay | Admitting: *Deleted

## 2016-07-09 DIAGNOSIS — C787 Secondary malignant neoplasm of liver and intrahepatic bile duct: Secondary | ICD-10-CM

## 2016-07-09 DIAGNOSIS — C7951 Secondary malignant neoplasm of bone: Secondary | ICD-10-CM

## 2016-07-09 DIAGNOSIS — C3492 Malignant neoplasm of unspecified part of left bronchus or lung: Secondary | ICD-10-CM

## 2016-07-13 ENCOUNTER — Other Ambulatory Visit: Payer: Self-pay | Admitting: Hematology and Oncology

## 2016-07-14 ENCOUNTER — Other Ambulatory Visit: Payer: Self-pay | Admitting: *Deleted

## 2016-07-14 DIAGNOSIS — C787 Secondary malignant neoplasm of liver and intrahepatic bile duct: Secondary | ICD-10-CM

## 2016-07-14 DIAGNOSIS — C3490 Malignant neoplasm of unspecified part of unspecified bronchus or lung: Secondary | ICD-10-CM

## 2016-07-14 DIAGNOSIS — C7951 Secondary malignant neoplasm of bone: Secondary | ICD-10-CM

## 2016-07-14 NOTE — Progress Notes (Signed)
Patient scheduled for MRI of brain with and without contrast for Saturday July 24, 2016 at 1:00pm. Patient informed of appointment time, date and location [facility]).  Mirian Mo, RN, BSN 07/14/2016 2:18 PM

## 2016-07-15 ENCOUNTER — Encounter: Payer: Self-pay | Admitting: Hematology and Oncology

## 2016-07-15 ENCOUNTER — Inpatient Hospital Stay (HOSPITAL_BASED_OUTPATIENT_CLINIC_OR_DEPARTMENT_OTHER): Payer: BLUE CROSS/BLUE SHIELD | Admitting: Hematology and Oncology

## 2016-07-15 ENCOUNTER — Inpatient Hospital Stay: Payer: BLUE CROSS/BLUE SHIELD

## 2016-07-15 ENCOUNTER — Other Ambulatory Visit: Payer: Self-pay | Admitting: *Deleted

## 2016-07-15 ENCOUNTER — Other Ambulatory Visit: Payer: Self-pay | Admitting: Hematology and Oncology

## 2016-07-15 VITALS — BP 105/72 | HR 95 | Temp 96.0°F | Resp 18 | Wt 103.6 lb

## 2016-07-15 DIAGNOSIS — K760 Fatty (change of) liver, not elsewhere classified: Secondary | ICD-10-CM

## 2016-07-15 DIAGNOSIS — Z79899 Other long term (current) drug therapy: Secondary | ICD-10-CM

## 2016-07-15 DIAGNOSIS — E876 Hypokalemia: Secondary | ICD-10-CM | POA: Diagnosis not present

## 2016-07-15 DIAGNOSIS — C787 Secondary malignant neoplasm of liver and intrahepatic bile duct: Secondary | ICD-10-CM

## 2016-07-15 DIAGNOSIS — C3432 Malignant neoplasm of lower lobe, left bronchus or lung: Secondary | ICD-10-CM | POA: Diagnosis not present

## 2016-07-15 DIAGNOSIS — C3492 Malignant neoplasm of unspecified part of left bronchus or lung: Secondary | ICD-10-CM

## 2016-07-15 DIAGNOSIS — C7951 Secondary malignant neoplasm of bone: Secondary | ICD-10-CM

## 2016-07-15 DIAGNOSIS — G893 Neoplasm related pain (acute) (chronic): Secondary | ICD-10-CM

## 2016-07-15 DIAGNOSIS — R197 Diarrhea, unspecified: Secondary | ICD-10-CM

## 2016-07-15 DIAGNOSIS — F17210 Nicotine dependence, cigarettes, uncomplicated: Secondary | ICD-10-CM

## 2016-07-15 DIAGNOSIS — Z86718 Personal history of other venous thrombosis and embolism: Secondary | ICD-10-CM

## 2016-07-15 LAB — COMPREHENSIVE METABOLIC PANEL
ALT: 14 U/L (ref 14–54)
AST: 20 U/L (ref 15–41)
Albumin: 3.2 g/dL — ABNORMAL LOW (ref 3.5–5.0)
Alkaline Phosphatase: 79 U/L (ref 38–126)
Anion gap: 8 (ref 5–15)
BUN: 6 mg/dL (ref 6–20)
CO2: 25 mmol/L (ref 22–32)
Calcium: 8.9 mg/dL (ref 8.9–10.3)
Chloride: 100 mmol/L — ABNORMAL LOW (ref 101–111)
Creatinine, Ser: 0.5 mg/dL (ref 0.44–1.00)
GFR calc Af Amer: >60 mL/min (ref >60)
GFR calc non Af Amer: >60 mL/min (ref >60)
Glucose, Bld: 158 mg/dL — ABNORMAL HIGH (ref 65–99)
Potassium: 3.2 mmol/L — ABNORMAL LOW (ref 3.5–5.1)
Sodium: 133 mmol/L — ABNORMAL LOW (ref 135–145)
Total Bilirubin: 0.3 mg/dL (ref 0.3–1.2)
Total Protein: 6.7 g/dL (ref 6.5–8.1)

## 2016-07-15 LAB — CBC WITH DIFFERENTIAL/PLATELET
Basophils Absolute: 0 10*3/uL (ref 0–0.1)
Basophils Relative: 0 %
Eosinophils Absolute: 0 10*3/uL (ref 0–0.7)
Eosinophils Relative: 0 %
HCT: 28.2 % — ABNORMAL LOW (ref 35.0–47.0)
Hemoglobin: 9.6 g/dL — ABNORMAL LOW (ref 12.0–16.0)
Lymphocytes Relative: 17 %
Lymphs Abs: 2 10*3/uL (ref 1.0–3.6)
MCH: 33.7 pg (ref 26.0–34.0)
MCHC: 34.2 g/dL (ref 32.0–36.0)
MCV: 98.6 fL (ref 80.0–100.0)
Monocytes Absolute: 1.7 10*3/uL — ABNORMAL HIGH (ref 0.2–0.9)
Monocytes Relative: 15 %
Neutro Abs: 7.9 10*3/uL — ABNORMAL HIGH (ref 1.4–6.5)
Neutrophils Relative %: 68 %
Platelets: 322 10*3/uL (ref 150–440)
RBC: 2.86 MIL/uL — ABNORMAL LOW (ref 3.80–5.20)
RDW: 18.8 % — ABNORMAL HIGH (ref 11.5–14.5)
WBC: 11.6 10*3/uL — ABNORMAL HIGH (ref 3.6–11.0)

## 2016-07-15 LAB — TSH: TSH: 2.488 u[IU]/mL (ref 0.350–4.500)

## 2016-07-15 LAB — MAGNESIUM: Magnesium: 1.7 mg/dL (ref 1.7–2.4)

## 2016-07-15 MED ORDER — HEPARIN SOD (PORK) LOCK FLUSH 100 UNIT/ML IV SOLN
500.0000 [IU] | Freq: Once | INTRAVENOUS | Status: AC
  Administered 2016-07-15: 500 [IU] via INTRAVENOUS

## 2016-07-15 MED ORDER — DENOSUMAB 120 MG/1.7ML ~~LOC~~ SOLN
120.0000 mg | Freq: Once | SUBCUTANEOUS | Status: AC
  Administered 2016-07-15: 120 mg via SUBCUTANEOUS
  Filled 2016-07-15: qty 1.7

## 2016-07-15 MED ORDER — HEPARIN SOD (PORK) LOCK FLUSH 100 UNIT/ML IV SOLN
INTRAVENOUS | Status: AC
  Filled 2016-07-15: qty 5

## 2016-07-15 NOTE — Progress Notes (Signed)
[facility] day:  07/15/2016  Chief Complaint: Christina Mckee is a 55 y.o. female with metastatic squamous cell lung cancer who is seen for further discussion regarding treatment and initiation of monthly Xgeva.  HPI: The patient was last seen in the medical oncology clinic on 07/08/2016.  At that time, restaging studies after 3 cycles of carboplatinum and Abraxane were reviewed. She had progressive disease.  We discussed initiation of nivolumab based on the CheckMate 017 trial.  After the patient's appointment, it was discovered that she was eligible for SWOG S1400 study (nivolumab +/- ipilumumab).  She was contacted and the study reviewed. She has subsequently met with the protocol nurse.  She has enrolled on the study.  Repeat Foundation One testing per protocol is being done.  She is scheduled for head MRI on 07/24/2016.  Symptomatically, she notes a cough for which she is using Robitussin. She notes pain in the bottom of her ribcage (left lower chest) described as a rubbing sensation like" something trying to get out". She is using ibuprofen for pain. She does not want pain medications.  She is having some loose stools, 1-2 a day. She plans on taking Pepto-Bismol for her diarrhea.   Past Medical History:  Diagnosis Date  . DVT (deep venous thrombosis) ([place])    in 2015  . Menopause    prior to 2014  . Squamous cell lung cancer [facility])     Past Surgical History:  Procedure Laterality Date  . BREAST CYST EXCISION     left breast removal  . CESAREAN SECTION    . IVC FILTER PLACEMENT ([facility])    . PERIPHERAL VASCULAR CATHETERIZATION N/A 04/28/2016   Procedure: Glori Luis Cath Insertion;  Surgeon: Algernon Huxley, MD;  Location: [facility];  Service: Cardiovascular;  Laterality: N/A;    Family History  Problem Relation Age of Onset  . Breast cancer Mother   . Breast cancer Maternal Aunt   . Stomach cancer Paternal Uncle   . Leukemia  Paternal Uncle     Social History:  reports that she has been smoking Cigarettes.  She has been smoking about 0.25 packs per day. She has never used smokeless tobacco. She reports that she drinks alcohol. She reports that she does not use drugs.  She has smoked 1/2-1 pack a day since age 53. She has a 30-40 pack year smoking history.  She drinks 4 beers a day. She lives in [place] alone with her cats and dogs. She has a dog named Production assistant, radio (lab and shepherd mix).  She previously worked at [employer], but notes all positions were recently eliminated. She continues to work.  Thursdays are better treatment days for her.  The patient is accompanied by the protocol nurse,  Christina Mckee, today.  Allergies:  Allergies  Allergen Reactions  . Penicillins Hives and Other (See Comments)    Has patient had a PCN reaction causing immediate rash, facial/tongue/throat swelling, SOB or lightheadedness with hypotension: No Has patient had a PCN reaction causing severe rash involving mucus membranes or skin necrosis: No Has patient had a PCN reaction that required hospitalization No Has patient had a PCN reaction occurring within the last 10 years: No If all of the above answers are "NO", then may proceed with Cephalosporin use.    Current Medications: Current Outpatient Prescriptions  Medication Sig Dispense Refill  . guaifenesin (ROBITUSSIN) 100 MG/5ML syrup Take 200 mg by mouth 3 (three) times daily  as needed for cough or congestion.    Marland Kitchen guaiFENesin-codeine (CHERATUSSIN AC) 100-10 MG/5ML syrup Take 5 mLs by mouth 3 (three) times daily as needed for cough. 120 mL 0  . ibuprofen (ADVIL,MOTRIN) 200 MG tablet Take by mouth.    . lidocaine-prilocaine (EMLA) cream Apply 1 application topically as needed. 30 g 6  . metoprolol tartrate (LOPRESSOR) 25 MG tablet Take 1 tablet (25 mg total) by mouth 2 (two) times daily. 60 tablet 2  . nicotine (NICODERM CQ - DOSED IN MG/24 HOURS) 21 mg/24hr patch Place 1 patch (21 mg  total) onto the skin daily. 28 patch 0  . ondansetron (ZOFRAN) 8 MG tablet TAKE 1 TABLET BY MOUTH TWICE DAILY AS NEEDED FOR NAUSEA OR VOMITING  1  . potassium chloride (K-DUR) 10 MEQ tablet Take 3 tabs PO in the morning and 2 tabs PO in the evening 150 tablet 0  . prochlorperazine (COMPAZINE) 10 MG tablet Take 1 tablet (10 mg total) by mouth every 6 (six) hours as needed for nausea or vomiting. 30 tablet 6   No current facility-administered medications for this visit.    Facility-Administered Medications Ordered in Other Visits  Medication Dose Route Frequency Provider Last Rate Last Dose  . sodium chloride flush (NS) 0.9 % injection 10 mL  10 mL Intravenous PRN Lequita Asal, MD   10 mL at 04/29/16 2956    Review of Systems:  GENERAL: Feels "the same". No fevers or sweats.  Weight down 3 pounds PERFORMANCE STATUS (ECOG): 1 HEENT: No visual changes, runny nose, sore throat, mouth sores or tenderness. Declines dental evaluation. Lungs: Dry cough with left sided chest discomfort.No increased shortness of breath.  No hemoptysis. Cardiac: No chest pain, palpitations, orthopnea, or PND. GI: Diarrhea (1-2 loose stools/day), less.  No vomiting, constipation, melena or hematochezia. GU: No urgency, frequency, dysuria, or hematuria. Musculoskeletal: Left sided musculoskeletal pain.  No back pain. No muscle tenderness. Extremities: Chronic left lower extremity edema. No pain. Skin: No rashes or skin changes. Neuro: Short term memory issues.  No headache, numbness or weakness, balance or coordination issues. Endocrine: No diabetes, thyroid issues, hot flashes or night sweats. Psych: No mood changes, depression or anxiety. Pain: Left lower rib rage discomfort (see HPI). Review of systems: All other systems reviewed and found to be negative  Physical Exam: Blood pressure 105/72, pulse 95, temperature (!) 96 F (35.6 C), temperature source Tympanic, resp. rate 18, weight 103  lb 9.9 oz (47 kg), last menstrual period 04/14/2004, SpO2 100 %. GENERAL: Thin woman sitting comfortably in the exam room in no acute distress.  MENTAL STATUS: Alert and oriented to person, place and time. HEAD: Wearing a beige crochet cap.  Normocephalic, atraumatic, face symmetric, no Cushingoid features. EYES: Blue eyes. Pupils equal round and reactive to light and accomodation. No conjunctivitis or scleral icterus. ENT: Oropharynx clear without lesion. Tongue normal. Mucous membranes moist.  RESPIRATORY: Clear to auscultation without rales, wheezes or rhonchi. CARDIOVASCULAR: Regular rate and rhythm without murmur, rub or gallop. CHEST WALL:  Slightly tender left lower ribs. ABDOMEN: Soft, non-tender, with active bowel sounds, and no hepatosplenomegaly. No masses. SKIN: Spider angiomas on chest.  No rashes, ulcers or lesions. EXTREMITIES:  No edema.  No skin discoloration or tenderness. No palpable cords. LYMPH NODES: No palpable cervical, supraclavicular, axillary or inguinal adenopathy  NEUROLOGICAL: Appropriate. PSYCH:  Appropriate.   Appointment on 07/15/2016  Component Date Value Ref Range Status  . WBC 07/15/2016 11.6* 3.6 - 11.0 K/uL Final  .  RBC 07/15/2016 2.86* 3.80 - 5.20 MIL/uL Final  . Hemoglobin 07/15/2016 9.6* 12.0 - 16.0 g/dL Final  . HCT 07/15/2016 28.2* 35.0 - 47.0 % Final  . MCV 07/15/2016 98.6  80.0 - 100.0 fL Final  . MCH 07/15/2016 33.7  26.0 - 34.0 pg Final  . MCHC 07/15/2016 34.2  32.0 - 36.0 g/dL Final  . RDW 07/15/2016 18.8* 11.5 - 14.5 % Final  . Platelets 07/15/2016 322  150 - 440 K/uL Final  . Neutrophils Relative % 07/15/2016 68  % Final  . Neutro Abs 07/15/2016 7.9* 1.4 - 6.5 K/uL Final  . Lymphocytes Relative 07/15/2016 17  % Final  . Lymphs Abs 07/15/2016 2.0  1.0 - 3.6 K/uL Final  . Monocytes Relative 07/15/2016 15  % Final  . Monocytes Absolute 07/15/2016 1.7* 0.2 - 0.9 K/uL Final  . Eosinophils Relative 07/15/2016 0  % Final  .  Eosinophils Absolute 07/15/2016 0.0  0 - 0.7 K/uL Final  . Basophils Relative 07/15/2016 0  % Final  . Basophils Absolute 07/15/2016 0.0  0 - 0.1 K/uL Final  . Sodium 07/15/2016 133* 135 - 145 mmol/L Final  . Potassium 07/15/2016 3.2* 3.5 - 5.1 mmol/L Final  . Chloride 07/15/2016 100* 101 - 111 mmol/L Final  . CO2 07/15/2016 25  22 - 32 mmol/L Final  . Glucose, Bld 07/15/2016 158* 65 - 99 mg/dL Final  . BUN 07/15/2016 6  6 - 20 mg/dL Final  . Creatinine, Ser 07/15/2016 0.50  0.44 - 1.00 mg/dL Final  . Calcium 07/15/2016 8.9  8.9 - 10.3 mg/dL Final  . Total Protein 07/15/2016 6.7  6.5 - 8.1 g/dL Final  . Albumin 07/15/2016 3.2* 3.5 - 5.0 g/dL Final  . AST 07/15/2016 20  15 - 41 U/L Final  . ALT 07/15/2016 14  14 - 54 U/L Final  . Alkaline Phosphatase 07/15/2016 79  38 - 126 U/L Final  . Total Bilirubin 07/15/2016 0.3  0.3 - 1.2 mg/dL Final  . GFR calc non Af Amer 07/15/2016 >60  >60 mL/min Final  . GFR calc Af Amer 07/15/2016 >60  >60 mL/min Final   Comment: (NOTE) The eGFR has been calculated using the CKD EPI equation. This calculation has not been validated in all clinical situations. eGFR's persistently <60 mL/min signify possible Chronic Kidney Disease.   . Anion gap 07/15/2016 8  5 - 15 Final  . Magnesium 07/15/2016 1.7  1.7 - 2.4 mg/dL Final    Assessment:  Christina Mckee is a 55 y.o. female with metastatic squamous cell lung cancer. Molecular studies were negative studies for EGFR, ALK, and ROS1. PD-L1 was 1%.  Chest CT angiogram on 03/05/2016 revealed 8.7 x 5.7 x 3.3 cm mass in the medial aspect of the superior segment of the left lower lobe extending into the medial aspect of the left upper lobe and into the left hilum. There was vascular and endobronchial encasement and narrowing. There were multiple small left lung nodules. Abdomen and pelvic CT scan revealed diffuse hepatic steatosis and no evidence of metastatic disease in the abdomen or pelvis.  CT guided left lower  lobe biopsy on 03/29/2016 revealed moderately differentiated squamous cell carcinoma. CEA and LDH were normal on 04/05/2016.  PET scan on 04/14/2016 revealed hypermetabolic left lower lobe mass with hypermetabolic left hilar adenopathy and left hepatic lobe lesion. Findings were consistent with stage IV primary bronchogenic carcinoma. There were mildly hypermetabolic subcarinal lymph node versus mild esophageal uptake. There was hepatic steatosis. Head  CT on 04/15/2016 was normal.  CEA (2.6) and LDH (128) were normal on 04/05/2016.  She has a history of an extensive left lower extremity thromboses in 12/2012. Because of the extensive clot burden, she underwent TPA with angioplasty on 12/28/2012. An IVC filter was placed on 12/29/2012 and still remains. Hypercoagulable workup was negative for Factor V Leiden, prothrombin gene mutation, lupus anticoagulant, and antithrombin III. She was on Xarelto for 6 months.  She received 3 cycles of carboplatin and Abraxane (04/30/2016 - 06/17/2016).  Chest, abdomen, and pelvic CT scan on 07/05/2016 revealed a mixed response.  There was response to therapy of dominant left lower lobe lung mass (5.7 x 3.3 cm to 5.5 x 2.3 cm).  Liver metastasis was likely progressive (3.4 x 2 cm).  There was development of multiple bilateral renal lesions.  There was new anterior L3 lucent and sclerotic lesion.  She has enrolled on SWOG S1400 (nivolumab +/- ipilumumab).  Symptomatically, she has a dry cough.  Diarrhea is improving.  She has hypokalemia (3.2).  Exam is stable.  Plan: 1.  Labs today:  CBC with diff, CMP, Mg, TSH, ACTH. 2.  Review patient's decision for protocol enrollment.  Discuss treatment with nivolumab +/- ipilumumab.  Discuss repeat Foundation One testing.  Discuss head MRI per protocol.  Anticipate initiation of treatment on 07/29/2016.  3.  Discuss current pain management with ibuprofen. She declines any pain medication at this time. 4.  Discuss diarrhea  management.  Discuss importance of resolution of diarrhea prior to initiation of treatment given potential side effect of treatment (diarrhea).  Discuss Imodium and Lomotil.  Patient declines.  She plans on using Pepto-Bismol. 5.  Discuss hypokalemia. Discuss IV potassium.  Patient declines.  She has oral potassium at home. 6.  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  Side effects rereviewed.  Patient declined dental evaluation. Patient consented to treatment. 7.  Xgeva today. 8.  Follow-up head MRI on 07/24/2016. 9.  RTC in 1 week for BMP. 10.  RTC on 07/29/2016 for MD assess, labs (CBC with diff, CMP, Mg) and cycle #1 SWOG 1400 (substudy I) nivolumab +/- ipilumumab   Lequita Asal, MD  07/15/2016

## 2016-07-15 NOTE — Progress Notes (Signed)
Patient is here for follow up, she mentions having the dry heaves. She also has a cough that she feels she is getting strangled with stuff. Pain on her left side

## 2016-07-15 NOTE — Progress Notes (Signed)
Met with patient in clinic today to review and discuss upcoming appointments and plan for next week. She is currently on S1400 clinical trial and met with Dr. Mike Gip today for labs and Xgeva injection. Research central labs were also collected and submitted to the study. The patient will return to clinic next week following return of specimen results to consider S1400I consent and to get additional labs for the sub study. Dr. Mike Gip has also ordered additional labs due to low potassium level today. Patient reports she has had some diarrhea but that it is improving, she reports having 1-2 stools per day (grade 1). She states she will take Pepto Bismol and if no improvement she agrees to contact us for a prescription for Lomotil. She also reports intermittent pain on her left side for which she takes Ibuprofen.  Dr. Mike Gip offered prescription pain medication but the patient refused. She agrees to contact us if she needs anything stronger for pain. The patient has a Head MRI scheduled for Saturday July 24, 2016 per protocol requirements. This research nurse will contact the patient regarding additional appointments needed following receipt of tumor tissue report submitted to the study on 07/13/16.

## 2016-07-16 ENCOUNTER — Ambulatory Visit: Payer: BLUE CROSS/BLUE SHIELD

## 2016-07-16 LAB — ACTH: C206 ACTH: 5.8 pg/mL — ABNORMAL LOW (ref 7.2–63.3)

## 2016-07-17 DIAGNOSIS — G893 Neoplasm related pain (acute) (chronic): Secondary | ICD-10-CM | POA: Insufficient documentation

## 2016-07-22 ENCOUNTER — Other Ambulatory Visit: Payer: Self-pay

## 2016-07-22 ENCOUNTER — Inpatient Hospital Stay: Payer: BLUE CROSS/BLUE SHIELD

## 2016-07-22 DIAGNOSIS — C3492 Malignant neoplasm of unspecified part of left bronchus or lung: Secondary | ICD-10-CM

## 2016-07-22 DIAGNOSIS — C3432 Malignant neoplasm of lower lobe, left bronchus or lung: Secondary | ICD-10-CM | POA: Diagnosis not present

## 2016-07-22 DIAGNOSIS — C787 Secondary malignant neoplasm of liver and intrahepatic bile duct: Secondary | ICD-10-CM

## 2016-07-22 DIAGNOSIS — C7951 Secondary malignant neoplasm of bone: Secondary | ICD-10-CM

## 2016-07-22 LAB — BASIC METABOLIC PANEL
Anion gap: 9 (ref 5–15)
BUN: 7 mg/dL (ref 6–20)
CO2: 27 mmol/L (ref 22–32)
Calcium: 8.9 mg/dL (ref 8.9–10.3)
Chloride: 99 mmol/L — ABNORMAL LOW (ref 101–111)
Creatinine, Ser: 0.63 mg/dL (ref 0.44–1.00)
GFR calc Af Amer: >60 mL/min (ref >60)
GFR calc non Af Amer: >60 mL/min (ref >60)
Glucose, Bld: 144 mg/dL — ABNORMAL HIGH (ref 65–99)
Potassium: 3.7 mmol/L (ref 3.5–5.1)
Sodium: 135 mmol/L (ref 135–145)

## 2016-07-22 LAB — LACTATE DEHYDROGENASE: LDH: 155 U/L (ref 98–192)

## 2016-07-22 LAB — LIPASE, BLOOD: Lipase: 21 U/L (ref 11–51)

## 2016-07-22 LAB — T4, FREE: Free T4: 1.42 ng/dL — ABNORMAL HIGH (ref 0.61–1.12)

## 2016-07-22 LAB — TSH: TSH: 2.651 u[IU]/mL (ref 0.350–4.500)

## 2016-07-22 LAB — AMYLASE: Amylase: 45 U/L (ref 28–100)

## 2016-07-23 LAB — T3, FREE: T3, Free: 3 pg/mL (ref 2.0–4.4)

## 2016-07-24 ENCOUNTER — Ambulatory Visit
Admission: RE | Admit: 2016-07-24 | Discharge: 2016-07-24 | Disposition: A | Discharge status: Home / Self Care | Payer: BLUE CROSS/BLUE SHIELD | Source: Ambulatory Visit | Attending: Hematology and Oncology | Admitting: Hematology and Oncology

## 2016-07-24 DIAGNOSIS — C3490 Malignant neoplasm of unspecified part of unspecified bronchus or lung: Secondary | ICD-10-CM | POA: Insufficient documentation

## 2016-07-24 DIAGNOSIS — C7951 Secondary malignant neoplasm of bone: Secondary | ICD-10-CM | POA: Insufficient documentation

## 2016-07-24 DIAGNOSIS — C787 Secondary malignant neoplasm of liver and intrahepatic bile duct: Secondary | ICD-10-CM

## 2016-07-24 MED ORDER — GADOBENATE DIMEGLUMINE 529 MG/ML IV SOLN
10.0000 mL | Freq: Once | INTRAVENOUS | Status: AC | PRN
Start: 2016-07-24 — End: 2016-07-24
  Administered 2016-07-24: 9 mL via INTRAVENOUS

## 2016-07-27 ENCOUNTER — Telehealth: Payer: Self-pay | Admitting: *Deleted

## 2016-07-27 ENCOUNTER — Other Ambulatory Visit: Payer: Self-pay | Admitting: *Deleted

## 2016-07-27 DIAGNOSIS — C7951 Secondary malignant neoplasm of bone: Secondary | ICD-10-CM

## 2016-07-27 DIAGNOSIS — C3492 Malignant neoplasm of unspecified part of left bronchus or lung: Secondary | ICD-10-CM

## 2016-07-27 DIAGNOSIS — C787 Secondary malignant neoplasm of liver and intrahepatic bile duct: Secondary | ICD-10-CM

## 2016-07-27 MED ORDER — HYDROCODONE-ACETAMINOPHEN 5-325 MG PO TABS: 1.0000 | ORAL_TABLET | Freq: Four times a day (QID) | ORAL | 0 refills | Status: DC | PRN

## 2016-07-27 NOTE — Progress Notes (Unsigned)
nrco

## 2016-07-27 NOTE — Telephone Encounter (Signed)
Called to report that she has worsening very low back (almost to the tailbone) pain radiating into her hips. Reports that she has had mild pain in that area for several weeks but it has intensified since Friday rating from 7 - 10/ 10 Using a "LOT" of ibuprofen and topical analgesics. She states the pain is a constant sharp pain with stabbing pains also. Movement makes it worse.

## 2016-07-27 NOTE — Telephone Encounter (Signed)
  We can start with Lortab 5/325 or Percocet 5/325 every 6 hours prn pain.  She would keep a pain diary for possible conversion to a Fentanyl patch.  M

## 2016-07-27 NOTE — Telephone Encounter (Signed)
Per Dr Mike Gip, is she willing to go on med for pain now as she has refused every time she has asked. I called patient back and she said YES she will take pain medicine.

## 2016-07-29 ENCOUNTER — Other Ambulatory Visit: Payer: Self-pay | Admitting: *Deleted

## 2016-07-29 ENCOUNTER — Encounter: Payer: Self-pay | Admitting: Hematology and Oncology

## 2016-07-29 ENCOUNTER — Other Ambulatory Visit: Payer: Self-pay

## 2016-07-29 ENCOUNTER — Ambulatory Visit
Admission: RE | Admit: 2016-07-29 | Discharge: 2016-07-29 | Disposition: A | Discharge status: Home / Self Care | Payer: BLUE CROSS/BLUE SHIELD | Source: Ambulatory Visit | Attending: Hematology and Oncology | Admitting: Hematology and Oncology

## 2016-07-29 ENCOUNTER — Inpatient Hospital Stay: Payer: BLUE CROSS/BLUE SHIELD

## 2016-07-29 ENCOUNTER — Inpatient Hospital Stay: Payer: BLUE CROSS/BLUE SHIELD | Attending: Hematology and Oncology | Admitting: Hematology and Oncology

## 2016-07-29 ENCOUNTER — Encounter: Payer: Self-pay | Admitting: *Deleted

## 2016-07-29 ENCOUNTER — Other Ambulatory Visit: Payer: Self-pay | Admitting: Hematology and Oncology

## 2016-07-29 VITALS — BP 97/60 | HR 120 | Temp 95.8°F | Resp 18 | Wt 100.5 lb

## 2016-07-29 DIAGNOSIS — E871 Hypo-osmolality and hyponatremia: Secondary | ICD-10-CM | POA: Insufficient documentation

## 2016-07-29 DIAGNOSIS — C7951 Secondary malignant neoplasm of bone: Secondary | ICD-10-CM

## 2016-07-29 DIAGNOSIS — E876 Hypokalemia: Secondary | ICD-10-CM

## 2016-07-29 DIAGNOSIS — E860 Dehydration: Secondary | ICD-10-CM | POA: Diagnosis not present

## 2016-07-29 DIAGNOSIS — C787 Secondary malignant neoplasm of liver and intrahepatic bile duct: Secondary | ICD-10-CM

## 2016-07-29 DIAGNOSIS — Z800 Family history of malignant neoplasm of digestive organs: Secondary | ICD-10-CM | POA: Diagnosis not present

## 2016-07-29 DIAGNOSIS — C3492 Malignant neoplasm of unspecified part of left bronchus or lung: Secondary | ICD-10-CM

## 2016-07-29 DIAGNOSIS — D72829 Elevated white blood cell count, unspecified: Secondary | ICD-10-CM

## 2016-07-29 DIAGNOSIS — R110 Nausea: Secondary | ICD-10-CM | POA: Diagnosis not present

## 2016-07-29 DIAGNOSIS — R634 Abnormal weight loss: Secondary | ICD-10-CM | POA: Insufficient documentation

## 2016-07-29 DIAGNOSIS — Z806 Family history of leukemia: Secondary | ICD-10-CM | POA: Insufficient documentation

## 2016-07-29 DIAGNOSIS — Z86718 Personal history of other venous thrombosis and embolism: Secondary | ICD-10-CM

## 2016-07-29 DIAGNOSIS — Z803 Family history of malignant neoplasm of breast: Secondary | ICD-10-CM | POA: Diagnosis not present

## 2016-07-29 DIAGNOSIS — M5489 Other dorsalgia: Secondary | ICD-10-CM

## 2016-07-29 DIAGNOSIS — Z006 Encounter for examination for normal comparison and control in clinical research program: Secondary | ICD-10-CM

## 2016-07-29 DIAGNOSIS — Z5111 Encounter for antineoplastic chemotherapy: Secondary | ICD-10-CM | POA: Diagnosis not present

## 2016-07-29 DIAGNOSIS — Z780 Asymptomatic menopausal state: Secondary | ICD-10-CM

## 2016-07-29 DIAGNOSIS — G893 Neoplasm related pain (acute) (chronic): Secondary | ICD-10-CM | POA: Diagnosis not present

## 2016-07-29 DIAGNOSIS — Z79899 Other long term (current) drug therapy: Secondary | ICD-10-CM

## 2016-07-29 DIAGNOSIS — M549 Dorsalgia, unspecified: Secondary | ICD-10-CM

## 2016-07-29 DIAGNOSIS — C3432 Malignant neoplasm of lower lobe, left bronchus or lung: Secondary | ICD-10-CM | POA: Diagnosis present

## 2016-07-29 DIAGNOSIS — I700 Atherosclerosis of aorta: Secondary | ICD-10-CM | POA: Insufficient documentation

## 2016-07-29 DIAGNOSIS — R630 Anorexia: Secondary | ICD-10-CM

## 2016-07-29 DIAGNOSIS — F17200 Nicotine dependence, unspecified, uncomplicated: Secondary | ICD-10-CM | POA: Insufficient documentation

## 2016-07-29 DIAGNOSIS — R918 Other nonspecific abnormal finding of lung field: Secondary | ICD-10-CM | POA: Insufficient documentation

## 2016-07-29 DIAGNOSIS — D649 Anemia, unspecified: Secondary | ICD-10-CM | POA: Insufficient documentation

## 2016-07-29 LAB — CBC WITH DIFFERENTIAL/PLATELET
Basophils Absolute: 0 10*3/uL (ref 0–0.1)
Basophils Relative: 0 %
Eosinophils Absolute: 0 10*3/uL (ref 0–0.7)
Eosinophils Relative: 0 %
HCT: 27.8 % — ABNORMAL LOW (ref 35.0–47.0)
Hemoglobin: 9.2 g/dL — ABNORMAL LOW (ref 12.0–16.0)
Lymphocytes Relative: 5 %
Lymphs Abs: 1.5 10*3/uL (ref 1.0–3.6)
MCH: 32.8 pg (ref 26.0–34.0)
MCHC: 33.2 g/dL (ref 32.0–36.0)
MCV: 98.8 fL (ref 80.0–100.0)
Monocytes Absolute: 1.8 10*3/uL — ABNORMAL HIGH (ref 0.2–0.9)
Monocytes Relative: 6 %
Neutro Abs: 25.5 10*3/uL — ABNORMAL HIGH (ref 1.4–6.5)
Neutrophils Relative %: 89 %
Platelets: 346 10*3/uL (ref 150–440)
RBC: 2.82 MIL/uL — ABNORMAL LOW (ref 3.80–5.20)
RDW: 17.7 % — ABNORMAL HIGH (ref 11.5–14.5)
WBC: 29 10*3/uL — ABNORMAL HIGH (ref 3.6–11.0)

## 2016-07-29 LAB — COMPREHENSIVE METABOLIC PANEL
ALT: 25 U/L (ref 14–54)
AST: 32 U/L (ref 15–41)
Albumin: 2.9 g/dL — ABNORMAL LOW (ref 3.5–5.0)
Alkaline Phosphatase: 123 U/L (ref 38–126)
Anion gap: 9 (ref 5–15)
BUN: 5 mg/dL — ABNORMAL LOW (ref 6–20)
CO2: 22 mmol/L (ref 22–32)
Calcium: 7.8 mg/dL — ABNORMAL LOW (ref 8.9–10.3)
Chloride: 96 mmol/L — ABNORMAL LOW (ref 101–111)
Creatinine, Ser: 0.56 mg/dL (ref 0.44–1.00)
GFR calc Af Amer: >60 mL/min (ref >60)
GFR calc non Af Amer: >60 mL/min (ref >60)
Glucose, Bld: 242 mg/dL — ABNORMAL HIGH (ref 65–99)
Potassium: 3.2 mmol/L — ABNORMAL LOW (ref 3.5–5.1)
Sodium: 127 mmol/L — ABNORMAL LOW (ref 135–145)
Total Bilirubin: 0.2 mg/dL — ABNORMAL LOW (ref 0.3–1.2)
Total Protein: 6.5 g/dL (ref 6.5–8.1)

## 2016-07-29 LAB — URINALYSIS COMPLETE WITH MICROSCOPIC (ARMC ONLY)
Bacteria, UA: NONE SEEN
Bilirubin Urine: NEGATIVE
Glucose, UA: NEGATIVE mg/dL
Hgb urine dipstick: NEGATIVE
Ketones, ur: NEGATIVE mg/dL
Leukocytes, UA: NEGATIVE
Nitrite: NEGATIVE
Protein, ur: NEGATIVE mg/dL
RBC / HPF: NONE SEEN RBC/hpf (ref 0–5)
Specific Gravity, Urine: 1.001 — ABNORMAL LOW (ref 1.005–1.030)
Squamous Epithelial / LPF: NONE SEEN
WBC, UA: NONE SEEN WBC/hpf (ref 0–5)
pH: 6 (ref 5.0–8.0)

## 2016-07-29 LAB — MAGNESIUM: Magnesium: 1.8 mg/dL (ref 1.7–2.4)

## 2016-07-29 MED ORDER — OXYCODONE-ACETAMINOPHEN 5-325 MG PO TABS
1.0000 | ORAL_TABLET | Freq: Once | ORAL | Status: AC
Start: 2016-07-29 — End: 2016-07-29
  Administered 2016-07-29: 1 via ORAL
  Filled 2016-07-29: qty 1

## 2016-07-29 MED ORDER — SODIUM CHLORIDE 0.9% FLUSH
10.0000 mL | Freq: Once | INTRAVENOUS | Status: DC
  Filled 2016-07-29: qty 10

## 2016-07-29 MED ORDER — TRAMADOL HCL 50 MG PO TABS: 50.0000 mg | ORAL_TABLET | Freq: Four times a day (QID) | ORAL | 0 refills | Status: DC | PRN

## 2016-07-29 MED ORDER — HEPARIN SOD (PORK) LOCK FLUSH 100 UNIT/ML IV SOLN
INTRAVENOUS | Status: AC
  Filled 2016-07-29: qty 5

## 2016-07-29 MED ORDER — POTASSIUM CHLORIDE 2 MEQ/ML IV SOLN
INTRAVENOUS | Status: DC
  Administered 2016-07-29: 10:00:00 via INTRAVENOUS
  Filled 2016-07-29: qty 1000

## 2016-07-29 MED ORDER — HEPARIN SOD (PORK) LOCK FLUSH 100 UNIT/ML IV SOLN
500.0000 [IU] | Freq: Once | INTRAVENOUS | Status: AC
  Administered 2016-07-29: 500 [IU] via INTRAVENOUS

## 2016-07-29 MED ORDER — MEGESTROL ACETATE 400 MG/10ML PO SUSP: 200.0000 mg | Freq: Every day | ORAL | 1 refills | Status: AC

## 2016-07-29 NOTE — Progress Notes (Addendum)
[facility] day:  07/29/2016   Chief Complaint: Christina Mckee is a 55 y.o. female with metastatic squamous cell lung cancer who is seen for assessment prior to initiation of nivolumab + ipilimumab on SWOG 1400.  HPI: The patient was last seen in the medical oncology clinic on 07/15/2016.  At that time, she had a dry cough.  She had some diarrhea post CT scan.  She received Xgeva.    Head MRI on 07/24/2016 revealed no evidence of metastatic disease.  Symptomatically, she notes 10 out of 10 pain in her neck, back, legs and hip.  Pain shoots down her hips.  She stats that the hydrocodone makes her sick and throw-up.  She has only been drinking root beer in tea.  Otitis poor. She notes a gag reflex up. She states that her potassium is low as she missed her potassium yesterday secondary to nausea.    Past Medical History:  Diagnosis Date  . DVT (deep venous thrombosis) ([facility])    in 2015  . Menopause    prior to 2014  . Squamous cell lung cancer [facility])     Past Surgical History:  Procedure Laterality Date  . BREAST CYST EXCISION     left breast removal  . CESAREAN SECTION    . IVC FILTER PLACEMENT ([facility] HX)    . PERIPHERAL VASCULAR CATHETERIZATION N/A 04/28/2016   Procedure: Glori Luis Cath Insertion;  Surgeon: Algernon Huxley, MD;  Location: [facility];  Service: Cardiovascular;  Laterality: N/A;    Family History  Problem Relation Age of Onset  . Breast cancer Mother   . Breast cancer Maternal Aunt   . Stomach cancer Paternal Uncle   . Leukemia Paternal Uncle     Social History:  reports that she has been smoking Cigarettes.  She has been smoking about 0.25 packs per day. She has never used smokeless tobacco. She reports that she drinks alcohol. She reports that she does not use drugs.  She has smoked 1/2-1 pack a day since age 34. She has a 30-40 pack year smoking history.  She drinks 4 beers a day. She lives in [place] alone with  her cats and dogs. She has a dog named Production assistant, radio (lab and shepherd mix).  She previously worked at [employer], but notes all positions were recently eliminated. She continues to work.  Thursdays are better treatment days for her.  Her daughter's wedding is Sunday, 08/01/2016.  The patient is accompanied by the protocol nurses today.  Allergies:  Allergies  Allergen Reactions  . Penicillins Hives and Other (See Comments)    Has patient had a PCN reaction causing immediate rash, facial/tongue/throat swelling, SOB or lightheadedness with hypotension: No Has patient had a PCN reaction causing severe rash involving mucus membranes or skin necrosis: No Has patient had a PCN reaction that required hospitalization No Has patient had a PCN reaction occurring within the last 10 years: No If all of the above answers are "NO", then may proceed with Cephalosporin use.    Current Medications: Current Outpatient Prescriptions  Medication Sig Dispense Refill  . guaifenesin (ROBITUSSIN) 100 MG/5ML syrup Take 200 mg by mouth 3 (three) times daily as needed for cough or congestion.    Marland Kitchen guaiFENesin-codeine (CHERATUSSIN AC) 100-10 MG/5ML syrup Take 5 mLs by mouth 3 (three) times daily as needed for cough. 120 mL 0  . HYDROcodone-acetaminophen (NORCO) 5-325 MG tablet Take 1 tablet by mouth every 6 (six)  hours as needed for moderate pain. 40 tablet 0  . ibuprofen (ADVIL,MOTRIN) 200 MG tablet Take by mouth.    . lidocaine-prilocaine (EMLA) cream Apply 1 application topically as needed. 30 g 6  . metoprolol tartrate (LOPRESSOR) 25 MG tablet Take 1 tablet (25 mg total) by mouth 2 (two) times daily. 60 tablet 2  . nicotine (NICODERM CQ - DOSED IN MG/24 HOURS) 21 mg/24hr patch Place 1 patch (21 mg total) onto the skin daily. 28 patch 0  . ondansetron (ZOFRAN) 8 MG tablet TAKE 1 TABLET BY MOUTH TWICE DAILY AS NEEDED FOR NAUSEA OR VOMITING  1  . potassium chloride (K-DUR) 10 MEQ tablet Take 3 tabs PO in the morning and 2 tabs PO  in the evening 150 tablet 0  . prochlorperazine (COMPAZINE) 10 MG tablet Take 1 tablet (10 mg total) by mouth every 6 (six) hours as needed for nausea or vomiting. 30 tablet 6   No current facility-administered medications for this visit.    Facility-Administered Medications Ordered in Other Visits  Medication Dose Route Frequency Provider Last Rate Last Dose  . heparin lock flush 100 unit/mL  500 Units Intravenous Once Lequita Asal, MD      . sodium chloride flush (NS) 0.9 % injection 10 mL  10 mL Intravenous PRN Lequita Asal, MD   10 mL at 04/29/16 0906  . sodium chloride flush (NS) 0.9 % injection 10 mL  10 mL Intravenous Once Lequita Asal, MD      . sodium chloride flush (NS) 0.9 % injection 10 mL  10 mL Intravenous Once Lequita Asal, MD        Review of Systems:  GENERAL: Feels "terrible". No fevers or sweats.  Weight down 3 pounds PERFORMANCE STATUS (ECOG): 1 HEENT: No visual changes, runny nose, sore throat, mouth sores or tenderness. Declines dental evaluation. Lungs: Dry cough with left sided chest discomfort (increased).No increased shortness of breath.  No hemoptysis. Cardiac: No chest pain, palpitations, orthopnea, or PND. GI:  Nausea and vomiting secondary to hydrocodone  No diarrhea, constipation, melena or hematochezia. GU: No urgency, frequency, dysuria, or hematuria. Musculoskeletal: Left sided musculoskeletal pain.  No back pain. No muscle tenderness. Extremities: Chronic left lower extremity edema. No pain. Skin: No rashes or skin changes. Neuro: Short term memory issues.  No headache, numbness or weakness, balance or coordination issues. Endocrine: No diabetes, thyroid issues, hot flashes or night sweats. Psych: No mood changes, depression or anxiety. Pain: Left lower rib rage discomfort. Review of systems: All other systems reviewed and found to be negative  Physical Exam: Blood pressure 97/60, pulse (!) 120, temperature (!)  95.8 F (35.4 C), temperature source Tympanic, resp. rate 18, weight 100 lb 8.5 oz (45.6 kg), last menstrual period 04/14/2004. GENERAL: Thin woman sitting comfortably in the exam room in no acute distress.  MENTAL STATUS: Alert and oriented to person, place and time. HEAD: Wearing a long red wig.  Normocephalic, atraumatic, face symmetric, no Cushingoid features. EYES: Blue eyes. Pupils equal round and reactive to light and accomodation. No conjunctivitis or scleral icterus. ENT: Oropharynx clear without lesion. Tongue normal. Mucous membranes moist.  RESPIRATORY: Clear to auscultation without rales, wheezes or rhonchi. CARDIOVASCULAR: Regular rate and rhythm without murmur, rub or gallop. CHEST WALL:  Tender left lower ribs. ABDOMEN: Soft, non-tender, with active bowel sounds, and no hepatosplenomegaly. No masses. BACK:  Pain on palpation lower lumbar spine. ? CVA tenderness. SKIN: Spider angiomas on chest.  No rashes, ulcers  or lesions. EXTREMITIES:  No edema.  No skin discoloration or tenderness. No palpable cords. LYMPH NODES: No palpable cervical, supraclavicular, axillary or inguinal adenopathy  NEUROLOGICAL: Appropriate. PSYCH:  Appropriate.   Infusion on 07/29/2016  Component Date Value Ref Range Status  . WBC 07/29/2016 29.0* 3.6 - 11.0 K/uL Final  . RBC 07/29/2016 2.82* 3.80 - 5.20 MIL/uL Final  . Hemoglobin 07/29/2016 9.2* 12.0 - 16.0 g/dL Final  . HCT 07/29/2016 27.8* 35.0 - 47.0 % Final  . MCV 07/29/2016 98.8  80.0 - 100.0 fL Final  . MCH 07/29/2016 32.8  26.0 - 34.0 pg Final  . MCHC 07/29/2016 33.2  32.0 - 36.0 g/dL Final  . RDW 07/29/2016 17.7* 11.5 - 14.5 % Final  . Platelets 07/29/2016 346  150 - 440 K/uL Final  . Neutrophils Relative % 07/29/2016 89  % Final  . Neutro Abs 07/29/2016 25.5* 1.4 - 6.5 K/uL Final  . Lymphocytes Relative 07/29/2016 5  % Final  . Lymphs Abs 07/29/2016 1.5  1.0 - 3.6 K/uL Final  . Monocytes Relative 07/29/2016 6  % Final   . Monocytes Absolute 07/29/2016 1.8* 0.2 - 0.9 K/uL Final  . Eosinophils Relative 07/29/2016 0  % Final  . Eosinophils Absolute 07/29/2016 0.0  0 - 0.7 K/uL Final  . Basophils Relative 07/29/2016 0  % Final  . Basophils Absolute 07/29/2016 0.0  0 - 0.1 K/uL Final  . Sodium 07/29/2016 127* 135 - 145 mmol/L Final  . Potassium 07/29/2016 3.2* 3.5 - 5.1 mmol/L Final  . Chloride 07/29/2016 96* 101 - 111 mmol/L Final  . CO2 07/29/2016 22  22 - 32 mmol/L Final  . Glucose, Bld 07/29/2016 242* 65 - 99 mg/dL Final  . BUN 07/29/2016 5* 6 - 20 mg/dL Final  . Creatinine, Ser 07/29/2016 0.56  0.44 - 1.00 mg/dL Final  . Calcium 07/29/2016 7.8* 8.9 - 10.3 mg/dL Final  . Total Protein 07/29/2016 6.5  6.5 - 8.1 g/dL Final  . Albumin 07/29/2016 2.9* 3.5 - 5.0 g/dL Final  . AST 07/29/2016 32  15 - 41 U/L Final  . ALT 07/29/2016 25  14 - 54 U/L Final  . Alkaline Phosphatase 07/29/2016 123  38 - 126 U/L Final  . Total Bilirubin 07/29/2016 0.2* 0.3 - 1.2 mg/dL Final  . GFR calc non Af Amer 07/29/2016 >60  >60 mL/min Final  . GFR calc Af Amer 07/29/2016 >60  >60 mL/min Final   Comment: (NOTE) The eGFR has been calculated using the CKD EPI equation. This calculation has not been validated in all clinical situations. eGFR's persistently <60 mL/min signify possible Chronic Kidney Disease.   . Anion gap 07/29/2016 9  5 - 15 Final  . Magnesium 07/29/2016 1.8  1.7 - 2.4 mg/dL Final    Assessment:  Christina Mckee is a 55 y.o. female with metastatic squamous cell lung cancer. Molecular studies were negative studies for EGFR, ALK, and ROS1. PD-L1 was 1%.  Chest CT angiogram on 03/05/2016 revealed 8.7 x 5.7 x 3.3 cm mass in the medial aspect of the superior segment of the left lower lobe extending into the medial aspect of the left upper lobe and into the left hilum. There was vascular and endobronchial encasement and narrowing. There were multiple small left lung nodules. Abdomen and pelvic CT scan revealed  diffuse hepatic steatosis and no evidence of metastatic disease in the abdomen or pelvis.  CT guided left lower lobe biopsy on 03/29/2016 revealed moderately differentiated squamous cell carcinoma. CEA  and LDH were normal on 04/05/2016.  PET scan on 04/14/2016 revealed hypermetabolic left lower lobe mass with hypermetabolic left hilar adenopathy and left hepatic lobe lesion. Findings were consistent with stage IV primary bronchogenic carcinoma. There were mildly hypermetabolic subcarinal lymph node versus mild esophageal uptake. There was hepatic steatosis. Head CT on 04/15/2016 was normal.  CEA (2.6) and LDH (128) were normal on 04/05/2016.  She has a history of an extensive left lower extremity thromboses in 12/2012. Because of the extensive clot burden, she underwent TPA with angioplasty on 12/28/2012. An IVC filter was placed on 12/29/2012 and still remains. Hypercoagulable workup was negative for Factor V Leiden, prothrombin gene mutation, lupus anticoagulant, and antithrombin III. She was on Xarelto for 6 months.  She received 3 cycles of carboplatin and Abraxane (04/30/2016 - 06/17/2016).  Chest, abdomen, and pelvic CT scan on 07/05/2016 revealed a mixed response.  There was response to therapy of dominant left lower lobe lung mass (5.7 x 3.3 cm to 5.5 x 2.3 cm).  Liver metastasis was likely progressive (3.4 x 2 cm).  There was development of multiple bilateral renal lesions.  There was new anterior L3 lucent and sclerotic lesion.  Head MRI on 07/24/2016 revealed no evidence of metastatic disease.  She received Xgeva on 07/15/2016.  She has enrolled on SWOG S1400 (nivolumab +/- ipilimumab).  Symptomatically, she has poorly controlled back pain.  Lortab makes her nauseous.  She has unexplained leukocytosis.  She has hyponatremia (127) and hypokalemia (3.2) secondary to nausea/vomiting and poor po intake.  She is orthostatic.  Exam reveals a tender lower back and left lower ribs.  Neurologic  function is stable.  Plan: 1.  Labs today:  CBC with diff, CMP, Mg. 2.  Discuss plan to postpone treatment secondary to multiple medical issues.  Discuss admission- patient declined.  Discuss IVF with potassium today.  Discuss assessment for source of infection (CXR and urine).  Discuss scheduling lumbar spine imaging.  Discuss oxycodone/acetaminophen in clinic.  If pain controlled, will provide Rx. 3.  Postpone treatment today. 4.  CXR (PA and lateral):  Leukocytosis and known left sided chest mass. 5.  Urinalysis and culture. 6.  IVF NS + 20 meq KCL.  Recheck orthostatics after fluid. 7.  Patient to continue her regular potassium supplementation. 8.  Schedule lumbar spine MRI with and without contrast:  severe back pain. 9.  Discuss weight loss and poor caloric intake.  Patient has no appetite.  Discuss appetite stimulant.  Discuss side effects of Megace (hypertension, hyperglycemia, and risk of thrombosis).  Patient previously declined, but would like to try because because of ongoing weight loss. 10.  Rx:  Megace 200 mg po q day. 11.  RTC tomorrow for MD assessment and labs (BMP) +/- IVF 12.  RTC on 08/02/2016 for MD assessment, labs (CBC with diff, CMP, Mg) and nivolumab + ipilimumab on SWOG 1400.  Addendum:  Percocet 5/325 caused patient to be sleepy.  Back pain was not controlled.  Will try Tamadol 50 mg po q 6 hrs prn pain; dis: #30.   Lequita Asal, MD  07/29/2016, 8:54 AM

## 2016-07-29 NOTE — Progress Notes (Signed)
Patient states she cannot take the Hydrocodone.  It makes her nauseated and makes her throw up.  She had several bouts yesterday.  States she has no appetite.  BP 97/60 HR 120.  States she is hurting in her back, hips, legs and shoulders.  Pain 10/10.  She wants something stronger for pain.

## 2016-07-30 ENCOUNTER — Inpatient Hospital Stay: Payer: BLUE CROSS/BLUE SHIELD

## 2016-07-30 ENCOUNTER — Inpatient Hospital Stay (HOSPITAL_BASED_OUTPATIENT_CLINIC_OR_DEPARTMENT_OTHER): Payer: BLUE CROSS/BLUE SHIELD | Admitting: Hematology and Oncology

## 2016-07-30 ENCOUNTER — Encounter: Payer: Self-pay | Admitting: *Deleted

## 2016-07-30 ENCOUNTER — Other Ambulatory Visit: Payer: Self-pay

## 2016-07-30 VITALS — BP 106/70 | HR 107 | Temp 95.4°F | Resp 18 | Wt 102.5 lb

## 2016-07-30 DIAGNOSIS — G893 Neoplasm related pain (acute) (chronic): Secondary | ICD-10-CM

## 2016-07-30 DIAGNOSIS — C787 Secondary malignant neoplasm of liver and intrahepatic bile duct: Secondary | ICD-10-CM | POA: Diagnosis not present

## 2016-07-30 DIAGNOSIS — Z803 Family history of malignant neoplasm of breast: Secondary | ICD-10-CM

## 2016-07-30 DIAGNOSIS — C7951 Secondary malignant neoplasm of bone: Secondary | ICD-10-CM

## 2016-07-30 DIAGNOSIS — Z86718 Personal history of other venous thrombosis and embolism: Secondary | ICD-10-CM

## 2016-07-30 DIAGNOSIS — C3432 Malignant neoplasm of lower lobe, left bronchus or lung: Secondary | ICD-10-CM

## 2016-07-30 DIAGNOSIS — Z79899 Other long term (current) drug therapy: Secondary | ICD-10-CM

## 2016-07-30 DIAGNOSIS — C3492 Malignant neoplasm of unspecified part of left bronchus or lung: Secondary | ICD-10-CM

## 2016-07-30 DIAGNOSIS — E871 Hypo-osmolality and hyponatremia: Secondary | ICD-10-CM

## 2016-07-30 DIAGNOSIS — Z780 Asymptomatic menopausal state: Secondary | ICD-10-CM

## 2016-07-30 DIAGNOSIS — C801 Malignant (primary) neoplasm, unspecified: Secondary | ICD-10-CM

## 2016-07-30 DIAGNOSIS — Z800 Family history of malignant neoplasm of digestive organs: Secondary | ICD-10-CM

## 2016-07-30 LAB — BASIC METABOLIC PANEL
Anion gap: 8 (ref 5–15)
BUN: <5 mg/dL — ABNORMAL LOW (ref 6–20)
CO2: 24 mmol/L (ref 22–32)
Calcium: 8 mg/dL — ABNORMAL LOW (ref 8.9–10.3)
Chloride: 98 mmol/L — ABNORMAL LOW (ref 101–111)
Creatinine, Ser: 0.63 mg/dL (ref 0.44–1.00)
GFR calc Af Amer: >60 mL/min (ref >60)
GFR calc non Af Amer: >60 mL/min (ref >60)
Glucose, Bld: 190 mg/dL — ABNORMAL HIGH (ref 65–99)
Potassium: 4.3 mmol/L (ref 3.5–5.1)
Sodium: 130 mmol/L — ABNORMAL LOW (ref 135–145)

## 2016-07-30 LAB — URINE CULTURE: Culture: NO GROWTH

## 2016-07-30 MED ORDER — HEPARIN SOD (PORK) LOCK FLUSH 100 UNIT/ML IV SOLN
500.0000 [IU] | Freq: Once | INTRAVENOUS | Status: AC
  Administered 2016-07-30: 500 [IU] via INTRAVENOUS

## 2016-07-30 MED ORDER — SODIUM CHLORIDE 0.9 % IJ SOLN
10.0000 mL | Freq: Once | INTRAMUSCULAR | Status: AC
  Administered 2016-07-30: 10 mL via INTRAVENOUS
  Filled 2016-07-30: qty 10

## 2016-07-30 NOTE — Progress Notes (Unsigned)
Ms. Kamphuis returns to clinic this morning for labs and to follow up with Dr. Mike Gip. She reports feeling better and states the Tramadol helped her pain a lot and she did not experience nausea or drowsiness with it. Her pharmacy has not yet gotten the Megace, but she states she will start this as soon as she gets it. Weight is up 2 lbs since she received the hydration fluids yesterday, but patient states she is not eating yet. Sodium level is 130, which is improved and now a grade 1. Potassium level is up to 3.4, still a grade 1; and blood glucose is down to 190, but this is still a grade 2 and is a non-fasting value. She did not receive hydration fluids today and was planning to go to work for the rest of the day. Her chest xray did not show infection and her urine culture showed no growth. Patient continues to deny any symptoms of infection. States she will have her MRI tomorrow and her daughter is getting married on Sunday. States she is really tired, but does not know when she will be able to rest. Yolande Jolly, BSN, MHA, OCN 07/30/2016 9:22 AM

## 2016-07-30 NOTE — Progress Notes (Signed)
[facility] day:  07/30/2016   Chief Complaint: Christina Mckee is a 55 y.o. female with metastatic squamous cell lung cancer who is seen for reassessment.  HPI: The patient was last seen in the medical oncology clinic on 07/29/2016.  At that time, treatment was postponed secondary to a multitude of issues.  She had poorly controlled lumbar spine pain. She had unexplained leukocytosis. She had hyponatremia and hypokalemia.  She was orthostatic.  She had not taken her potassium pills as the Lortab she was taking for pain caused nausea and vomiting.  She received IV fluids as well as potassium yesterday. Chest x-ray revealed no acute changes. Urinalysis revealed no evidence of infection. Percocet poorly controlled her pain. It made her sleepy.  She was given a prescription for Tramadol.  A lumbar spine MRI was scheduled.  Symptomatically, she is feeling better today.  Lower back pain has improved on Tramadol.  She continues to have left lower chest pain felt associated with her mass.  She denies any shortness of breath or pleuritic pain.  She denies any lower extremity edema.  She denies any fever.   Past Medical History:  Diagnosis Date  . DVT (deep venous thrombosis) ([place])    in 2015  . Menopause    prior to 2014  . Squamous cell lung cancer [facility])     Past Surgical History:  Procedure Laterality Date  . BREAST CYST EXCISION     left breast removal  . CESAREAN SECTION    . IVC FILTER PLACEMENT ([facility] HX)    . PERIPHERAL VASCULAR CATHETERIZATION N/A 04/28/2016   Procedure: Glori Luis Cath Insertion;  Surgeon: Algernon Huxley, MD;  Location: [facility] [place] [facility];  Service: Cardiovascular;  Laterality: N/A;    Family History  Problem Relation Age of Onset  . Breast cancer Mother   . Breast cancer Maternal Aunt   . Stomach cancer Paternal Uncle   . Leukemia Paternal Uncle     Social History:  reports that she has been smoking Cigarettes.  She has been  smoking about 0.25 packs per day. She has never used smokeless tobacco. She reports that she drinks alcohol. She reports that she does not use drugs.  She has smoked 1/2-1 pack a day since age 84. She has a 30-40 pack year smoking history.  She drinks 4 beers a day. She lives in [place] alone with her cats and dogs. She has a dog named Production assistant, radio (lab and shepherd mix).  She previously worked at [employer], but notes all positions were recently eliminated. She continues to work.  Thursdays are better treatment days for her.  Her daughter is getting married on 08/01/2016.  She is off of work on 08/02/2016 to run errands with her daughter.  The patient is alone today.  Allergies:  Allergies  Allergen Reactions  . Penicillins Hives and Other (See Comments)    Has patient had a PCN reaction causing immediate rash, facial/tongue/throat swelling, SOB or lightheadedness with hypotension: No Has patient had a PCN reaction causing severe rash involving mucus membranes or skin necrosis: No Has patient had a PCN reaction that required hospitalization No Has patient had a PCN reaction occurring within the last 10 years: No If all of the above answers are "NO", then may proceed with Cephalosporin use.    Current Medications: Current Outpatient Prescriptions  Medication Sig Dispense Refill  . guaifenesin (ROBITUSSIN) 100 MG/5ML syrup Take 200 mg by mouth 3 (three)  times daily as needed for cough or congestion.    Marland Kitchen guaiFENesin-codeine (CHERATUSSIN AC) 100-10 MG/5ML syrup Take 5 mLs by mouth 3 (three) times daily as needed for cough. 120 mL 0  . HYDROcodone-acetaminophen (NORCO) 5-325 MG tablet Take 1 tablet by mouth every 6 (six) hours as needed for moderate pain. 40 tablet 0  . ibuprofen (ADVIL,MOTRIN) 200 MG tablet Take by mouth.    . lidocaine-prilocaine (EMLA) cream Apply 1 application topically as needed. 30 g 6  . megestrol (MEGACE) 400 MG/10ML suspension Take 5 mLs (200 mg total) by mouth daily. 150 mL 1   . metoprolol tartrate (LOPRESSOR) 25 MG tablet Take 1 tablet (25 mg total) by mouth 2 (two) times daily. 60 tablet 2  . nicotine (NICODERM CQ - DOSED IN MG/24 HOURS) 21 mg/24hr patch Place 1 patch (21 mg total) onto the skin daily. 28 patch 0  . ondansetron (ZOFRAN) 8 MG tablet TAKE 1 TABLET BY MOUTH TWICE DAILY AS NEEDED FOR NAUSEA OR VOMITING  1  . potassium chloride (K-DUR) 10 MEQ tablet Take 3 tabs PO in the morning and 2 tabs PO in the evening 150 tablet 0  . prochlorperazine (COMPAZINE) 10 MG tablet Take 1 tablet (10 mg total) by mouth every 6 (six) hours as needed for nausea or vomiting. 30 tablet 6  . traMADol (ULTRAM) 50 MG tablet Take 1 tablet (50 mg total) by mouth every 6 (six) hours as needed. 30 tablet 0   No current facility-administered medications for this visit.    Facility-Administered Medications Ordered in Other Visits  Medication Dose Route Frequency Provider Last Rate Last Dose  . sodium chloride 0.9 % 1,000 mL with potassium chloride 20 mEq infusion   Intravenous Continuous Lequita Asal, MD   Stopped at 07/29/16 1149  . sodium chloride flush (NS) 0.9 % injection 10 mL  10 mL Intravenous PRN Lequita Asal, MD   10 mL at 04/29/16 4854    Review of Systems:  GENERAL: Feels "better". No fevers or sweats.  Weight up 2 pounds PERFORMANCE STATUS (ECOG): 1 HEENT: No visual changes, runny nose, sore throat, mouth sores or tenderness. Declines dental evaluation. Lungs: Left sided chest discomfort.No increased shortness of breath.  No pleuritic chest pain.  No hemoptysis. Cardiac: No chest pain, palpitations, orthopnea, or PND. GI: No nausea, vomiting, constipation, melena or hematochezia. GU: No urgency, frequency, dysuria, or hematuria. Musculoskeletal: Left sided musculoskeletal pain.  Lumbar back pain, improved. No muscle tenderness. Extremities: Chronic left lower extremity edema. No pain. Skin: No rashes or skin changes. Neuro: Short term memory  issues.  No headache, numbness or weakness, balance or coordination issues. Endocrine: No diabetes, thyroid issues, hot flashes or night sweats. Psych: No mood changes, depression or anxiety. Pain: Left lower rib rage discomfort.  Back pain, improved. Review of systems: All other systems reviewed and found to be negative  Physical Exam: Blood pressure 106/70, pulse (!) 107, temperature (!) 95.4 F (35.2 C), temperature source Tympanic, resp. rate 18, weight 102 lb 8.2 oz (46.5 kg), last menstrual period 04/14/2004. GENERAL: Thin woman sitting comfortably in the exam room in no acute distress.  MENTAL STATUS: Alert and oriented to person, place and time. HEAD: Normocephalic, atraumatic, face symmetric, no Cushingoid features. EYES: Blue eyes. No conjunctivitis or scleral icterus. RESPIRATORY: Clear to auscultation without rales, wheezes or rhonchi. CARDIOVASCULAR: Regular rate and rhythm without murmur, rub or gallop. SKIN: Spider angiomas on chest.  No rashes, ulcers or lesions. EXTREMITIES:  No  edema.  No skin discoloration or tenderness. No palpable cords. NEUROLOGICAL: Appropriate. PSYCH:  Appropriate.   Orders Only on 07/29/2016  Component Date Value Ref Range Status  . Color, Urine 07/29/2016 STRAW* YELLOW Final  . APPearance 07/29/2016 CLEAR* CLEAR Final  . Glucose, UA 07/29/2016 NEGATIVE  NEGATIVE mg/dL Final  . Bilirubin Urine 07/29/2016 NEGATIVE  NEGATIVE Final  . Ketones, ur 07/29/2016 NEGATIVE  NEGATIVE mg/dL Final  . Specific Gravity, Urine 07/29/2016 1.001* 1.005 - 1.030 Final  . Hgb urine dipstick 07/29/2016 NEGATIVE  NEGATIVE Final  . pH 07/29/2016 6.0  5.0 - 8.0 Final  . Protein, ur 07/29/2016 NEGATIVE  NEGATIVE mg/dL Final  . Nitrite 07/29/2016 NEGATIVE  NEGATIVE Final  . Leukocytes, UA 07/29/2016 NEGATIVE  NEGATIVE Final  . RBC / HPF 07/29/2016 NONE SEEN  0 - 5 RBC/hpf Final  . WBC, UA 07/29/2016 NONE SEEN  0 - 5 WBC/hpf Final  . Bacteria, UA  07/29/2016 NONE SEEN  NONE SEEN Final  . Squamous Epithelial / LPF 07/29/2016 NONE SEEN  NONE SEEN Final  Infusion on 07/29/2016  Component Date Value Ref Range Status  . WBC 07/29/2016 29.0* 3.6 - 11.0 K/uL Final  . RBC 07/29/2016 2.82* 3.80 - 5.20 MIL/uL Final  . Hemoglobin 07/29/2016 9.2* 12.0 - 16.0 g/dL Final  . HCT 07/29/2016 27.8* 35.0 - 47.0 % Final  . MCV 07/29/2016 98.8  80.0 - 100.0 fL Final  . MCH 07/29/2016 32.8  26.0 - 34.0 pg Final  . MCHC 07/29/2016 33.2  32.0 - 36.0 g/dL Final  . RDW 07/29/2016 17.7* 11.5 - 14.5 % Final  . Platelets 07/29/2016 346  150 - 440 K/uL Final  . Neutrophils Relative % 07/29/2016 89  % Final  . Neutro Abs 07/29/2016 25.5* 1.4 - 6.5 K/uL Final  . Lymphocytes Relative 07/29/2016 5  % Final  . Lymphs Abs 07/29/2016 1.5  1.0 - 3.6 K/uL Final  . Monocytes Relative 07/29/2016 6  % Final  . Monocytes Absolute 07/29/2016 1.8* 0.2 - 0.9 K/uL Final  . Eosinophils Relative 07/29/2016 0  % Final  . Eosinophils Absolute 07/29/2016 0.0  0 - 0.7 K/uL Final  . Basophils Relative 07/29/2016 0  % Final  . Basophils Absolute 07/29/2016 0.0  0 - 0.1 K/uL Final  . Sodium 07/29/2016 127* 135 - 145 mmol/L Final  . Potassium 07/29/2016 3.2* 3.5 - 5.1 mmol/L Final  . Chloride 07/29/2016 96* 101 - 111 mmol/L Final  . CO2 07/29/2016 22  22 - 32 mmol/L Final  . Glucose, Bld 07/29/2016 242* 65 - 99 mg/dL Final  . BUN 07/29/2016 5* 6 - 20 mg/dL Final  . Creatinine, Ser 07/29/2016 0.56  0.44 - 1.00 mg/dL Final  . Calcium 07/29/2016 7.8* 8.9 - 10.3 mg/dL Final  . Total Protein 07/29/2016 6.5  6.5 - 8.1 g/dL Final  . Albumin 07/29/2016 2.9* 3.5 - 5.0 g/dL Final  . AST 07/29/2016 32  15 - 41 U/L Final  . ALT 07/29/2016 25  14 - 54 U/L Final  . Alkaline Phosphatase 07/29/2016 123  38 - 126 U/L Final  . Total Bilirubin 07/29/2016 0.2* 0.3 - 1.2 mg/dL Final  . GFR calc non Af Amer 07/29/2016 >60  >60 mL/min Final  . GFR calc Af Amer 07/29/2016 >60  >60 mL/min Final    Comment: (NOTE) The eGFR has been calculated using the CKD EPI equation. This calculation has not been validated in all clinical situations. eGFR's persistently <60 mL/min signify possible Chronic Kidney  Disease.   . Anion gap 07/29/2016 9  5 - 15 Final  . Magnesium 07/29/2016 1.8  1.7 - 2.4 mg/dL Final    Assessment:  Christina Mckee is a 55 y.o. female with metastatic squamous cell lung cancer. Molecular studies were negative studies for EGFR, ALK, and ROS1. PD-L1 was 1%.  Chest CT angiogram on 03/05/2016 revealed 8.7 x 5.7 x 3.3 cm mass in the medial aspect of the superior segment of the left lower lobe extending into the medial aspect of the left upper lobe and into the left hilum. There was vascular and endobronchial encasement and narrowing. There were multiple small left lung nodules. Abdomen and pelvic CT scan revealed diffuse hepatic steatosis and no evidence of metastatic disease in the abdomen or pelvis.  CT guided left lower lobe biopsy on 03/29/2016 revealed moderately differentiated squamous cell carcinoma. CEA and LDH were normal on 04/05/2016.  PET scan on 04/14/2016 revealed hypermetabolic left lower lobe mass with hypermetabolic left hilar adenopathy and left hepatic lobe lesion. Findings were consistent with stage IV primary bronchogenic carcinoma. There were mildly hypermetabolic subcarinal lymph node versus mild esophageal uptake. There was hepatic steatosis. Head CT on 04/15/2016 was normal.  CEA (2.6) and LDH (128) were normal on 04/05/2016.  She has a history of an extensive left lower extremity thromboses in 12/2012. Because of the extensive clot burden, she underwent TPA with angioplasty on 12/28/2012. An IVC filter was placed on 12/29/2012 and still remains. Hypercoagulable workup was negative for Factor V Leiden, prothrombin gene mutation, lupus anticoagulant, and antithrombin III. She was on Xarelto for 6 months.  She received 3 cycles of carboplatin and  Abraxane (04/30/2016 - 06/17/2016).  Chest, abdomen, and pelvic CT scan on 07/05/2016 revealed a mixed response.  There was response to therapy of dominant left lower lobe lung mass (5.7 x 3.3 cm to 5.5 x 2.3 cm).  Liver metastasis was likely progressive (3.4 x 2 cm).  There was development of multiple bilateral renal lesions.  There was new anterior L3 lucent and sclerotic lesion.  Head MRI on 07/24/2016 revealed no evidence of metastatic disease.  She began monthly Xgeva on 07/15/2016.  She has enrolled on SWOG S1400 (nivolumab +/- ipilimumab).   Symptomatically,  she is feeling better today.  Lower back pain has improved on Tramadol.  She has chronic left lower chest pain felt associated with her mass.  Plan: 1.  Labs today:  BMP. 2.  Discuss CXR and urinalysis.  No evidence of infection. 3.  Discuss fluids and electrolytes.  Sodium and potassium have improved.  Discuss fluids with electrolytes. 4.  Discuss pain.  Lumbar spine pain improved with Tramadol.  Left lower lobe pain persists.  She feels that the pain is the same as with her initial diagnosis.  Discuss initial chest CT angiogram which revealed no evidence of thrombosis.  Discuss consideration of CT angiogram if pain persists.  Patient wishes to postpone. 5.  Lumbar spine MRI on 07/31/2016. 6.  RTC on 08/02/2016 for MD assessment, labs (CBC with diff, CMP, Mg) and cycle #1 SWOG 1400 (substudy I) nivolumab + ipilimumab   Lequita Asal, MD  07/30/2016, 9:03 AM

## 2016-07-30 NOTE — Progress Notes (Signed)
Christina Mckee arrived this morning for scheduled Cycle 1, Day 1 infusion of Nivolumab + Ipilimumab per the SWOG S1400I research protocol. She has had her labs collected and per Dr. Mike Gip, she is hyponatremic, hypokalemia and her white blood count is 29,000. Also of note, her blood glucose is '242mg'$ /dL. Clinically significant lab values: Potassium - 3.2(grade 1); Sodium - 127(grade 3); Calcium 7.8; corrected calcium 8.54 (grade 2) blood glucose - 242(grade 2); and WBC - 29,000 indicating possible infection. Patient denies any symptoms other than sharp pain in her lower back, which radiates around to her hips and occasionally shoots down her thighs. States she has not taken her prn pain medication and will never take it again due to the severe nausea it causes her. Pain grade is 7-8/10 and occasionally up to a 10. Dr. Mike Gip has examined the patient and informed her that she is concerned that she is dehydrated due to her very low sodium and potassium levels along with her 3 lb weight loss since last week. States she will give her some hydration fluids this morning along with an IV potassium infusion and then send her for a chest Xray to determine whether she may have developed pneumonia or a lung infection. Also informed Christina Mckee that she will not be receiving her Study drug infusion today since her condition is so poor. Patient insists that she is well enough to receive it, but explained that it will be better to wait until she is better hydrated and free of any potential infection. Dr. Mike Gip reviewed results of brain MRI, which was negative.Patient did receive 2 hours of hydration fluids, then went to have her chest xray. She also received Percocet for pain, which she reports helped the pain a little, but made her very drowsy. Dr. Mike Gip has given her a prescription for Tramadol to see if this helps her pain, as well as Megace to hopefully improve her appetite. She has also ordered an MRI of her  spine, which will be performed on Saturday - 07/31/16. Christina Mckee will return to clinic in the morning for labs and possible hydration fluids. Current plan is to begin C1D1 study drugs on Monday - 08/02/16. Christina Mckee [id number]  07/30/2016  Adverse Event Log  Study/Protocol: SWOG [id number]  Event Grade Onset Date Resolved Date Drug Name Attribution Treatment Comments  Hyponatremia 3 07/29/16   unrelated  IV fluids  Hypokalemia 1 07/29/16   unrelated  IV potassium  Weight loss 2 03/29/16   unrelated  Megace Rx 20 lb weight loss in <5 mos  Elevated WBC No grade 07/29/16   unrelated  U/A/ C&S/ CXR  Back Pain 3 07/27/16   unrelated  MRI of spine  Hyperglycemia 2 07/29/16   unrelated  none  Hypocalcemia 2 07/29/16   unrelated  Corrected calcium [address], BSN, [place], [facility] 07/29/2016 12:40 PM

## 2016-07-30 NOTE — Progress Notes (Signed)
Patient states she is having more pain today in her left ribcage.  No nausea or vomiting today.  Has not taken her pain medication today due to having to drive herself to clinic.

## 2016-07-31 ENCOUNTER — Other Ambulatory Visit: Payer: Self-pay | Admitting: Hematology and Oncology

## 2016-07-31 ENCOUNTER — Ambulatory Visit
Admission: RE | Admit: 2016-07-31 | Discharge: 2016-07-31 | Disposition: A | Discharge status: Home / Self Care | Payer: BLUE CROSS/BLUE SHIELD | Source: Ambulatory Visit | Attending: Hematology and Oncology | Admitting: Hematology and Oncology

## 2016-07-31 DIAGNOSIS — E876 Hypokalemia: Secondary | ICD-10-CM | POA: Diagnosis not present

## 2016-07-31 DIAGNOSIS — C3492 Malignant neoplasm of unspecified part of left bronchus or lung: Secondary | ICD-10-CM | POA: Diagnosis not present

## 2016-07-31 DIAGNOSIS — M5489 Other dorsalgia: Secondary | ICD-10-CM | POA: Diagnosis not present

## 2016-07-31 DIAGNOSIS — D72829 Elevated white blood cell count, unspecified: Secondary | ICD-10-CM | POA: Insufficient documentation

## 2016-07-31 DIAGNOSIS — C787 Secondary malignant neoplasm of liver and intrahepatic bile duct: Secondary | ICD-10-CM | POA: Insufficient documentation

## 2016-07-31 DIAGNOSIS — R634 Abnormal weight loss: Secondary | ICD-10-CM

## 2016-07-31 DIAGNOSIS — E871 Hypo-osmolality and hyponatremia: Secondary | ICD-10-CM | POA: Insufficient documentation

## 2016-07-31 DIAGNOSIS — M5125 Other intervertebral disc displacement, thoracolumbar region: Secondary | ICD-10-CM | POA: Insufficient documentation

## 2016-07-31 DIAGNOSIS — M899 Disorder of bone, unspecified: Secondary | ICD-10-CM | POA: Insufficient documentation

## 2016-07-31 DIAGNOSIS — C7951 Secondary malignant neoplasm of bone: Secondary | ICD-10-CM | POA: Diagnosis present

## 2016-08-01 ENCOUNTER — Encounter: Payer: Self-pay | Admitting: Hematology and Oncology

## 2016-08-01 ENCOUNTER — Other Ambulatory Visit: Payer: Self-pay | Admitting: Hematology and Oncology

## 2016-08-01 NOTE — Progress Notes (Signed)
[facility] day:  08/02/2016   Chief Complaint: Christina Mckee is a 55 y.o. female with metastatic squamous cell lung cancer who is seen for assessment prior to nivolumab + ipilimumab) on SWOG S1400.   HPI: The patient was last seen in the medical oncology clinic on 07/30/2016.  At that time, she was feeling better.  Lower back pain had improved on Tramadol.  She continued to have left lower chest pain felt associated with her mass.  She denied any shortness of breath, pleuritic pain, lower extremity edema or fever.  Per prior plan, she underwent lumbar spine MRI on 07/31/2016.  There were multiple vertebral body bone lesions most consistent with metastatic disease.  There was no evidence of epidural extension.  There was a small left paracentral disc protrusion at T12-L1. There was no evidence of nerve root impingement.  During the interim, she notes that the Tramadol at night is helping with her back pain.  It is manageable.  She still has some discomfort in the left lower chest.  She states that her Megace is not ready for pickup.  She missed 2 potassium pills yesterday.   Past Medical History:  Diagnosis Date  . DVT (deep venous thrombosis) ([place])    in 2015  . Menopause    prior to 2014  . Squamous cell lung cancer [facility])     Past Surgical History:  Procedure Laterality Date  . BREAST CYST EXCISION     left breast removal  . CESAREAN SECTION    . IVC FILTER PLACEMENT ([facility] HX)    . PERIPHERAL VASCULAR CATHETERIZATION N/A 04/28/2016   Procedure: Glori Luis Cath Insertion;  Surgeon: Algernon Huxley, MD;  Location: [facility];  Service: Cardiovascular;  Laterality: N/A;    Family History  Problem Relation Age of Onset  . Breast cancer Mother   . Breast cancer Maternal Aunt   . Stomach cancer Paternal Uncle   . Leukemia Paternal Uncle     Social History:  reports that she has been smoking Cigarettes.  She has been smoking about 0.25  packs per day. She has never used smokeless tobacco. She reports that she drinks alcohol. She reports that she does not use drugs.  She has smoked 1/2-1 pack a day since age 78. She has a 30-40 pack year smoking history.  She drinks 4 beers a day. She lives in [place] alone with her cats and dogs. She has a dog named Production assistant, radio (lab and shepherd mix).  She previously worked at [employer], but notes all positions were recently eliminated. She continues to work.  Thursdays are better treatment days for her.  Her daughter is getting married on 08/01/2016.  She is off of work on 08/02/2016 to run errands with her daughter.  The patient is alone today.  Allergies:  Allergies  Allergen Reactions  . Penicillins Hives and Other (See Comments)    Has patient had a PCN reaction causing immediate rash, facial/tongue/throat swelling, SOB or lightheadedness with hypotension: No Has patient had a PCN reaction causing severe rash involving mucus membranes or skin necrosis: No Has patient had a PCN reaction that required hospitalization No Has patient had a PCN reaction occurring within the last 10 years: No If all of the above answers are "NO", then may proceed with Cephalosporin use.    Current Medications: Current Outpatient Prescriptions  Medication Sig Dispense Refill  . guaifenesin (ROBITUSSIN) 100 MG/5ML syrup Take 200 mg by  mouth 3 (three) times daily as needed for cough or congestion.    Marland Kitchen guaiFENesin-codeine (CHERATUSSIN AC) 100-10 MG/5ML syrup Take 5 mLs by mouth 3 (three) times daily as needed for cough. 120 mL 0  . HYDROcodone-acetaminophen (NORCO) 5-325 MG tablet Take 1 tablet by mouth every 6 (six) hours as needed for moderate pain. 40 tablet 0  . ibuprofen (ADVIL,MOTRIN) 200 MG tablet Take by mouth.    . lidocaine-prilocaine (EMLA) cream Apply 1 application topically as needed. 30 g 6  . megestrol (MEGACE) 400 MG/10ML suspension Take 5 mLs (200 mg total) by mouth daily. 150 mL 1  . metoprolol  tartrate (LOPRESSOR) 25 MG tablet Take 1 tablet (25 mg total) by mouth 2 (two) times daily. 60 tablet 2  . nicotine (NICODERM CQ - DOSED IN MG/24 HOURS) 21 mg/24hr patch Place 1 patch (21 mg total) onto the skin daily. 28 patch 0  . ondansetron (ZOFRAN) 8 MG tablet TAKE 1 TABLET BY MOUTH TWICE DAILY AS NEEDED FOR NAUSEA OR VOMITING  1  . potassium chloride (K-DUR) 10 MEQ tablet Take 3 tabs PO in the morning and 2 tabs PO in the evening 150 tablet 0  . prochlorperazine (COMPAZINE) 10 MG tablet Take 1 tablet (10 mg total) by mouth every 6 (six) hours as needed for nausea or vomiting. 30 tablet 6  . traMADol (ULTRAM) 50 MG tablet Take 1 tablet (50 mg total) by mouth every 6 (six) hours as needed. 30 tablet 0   No current facility-administered medications for this visit.    Facility-Administered Medications Ordered in Other Visits  Medication Dose Route Frequency Provider Last Rate Last Dose  . heparin lock flush 100 unit/mL  500 Units Intravenous Once Lequita Asal, MD      . sodium chloride flush (NS) 0.9 % injection 10 mL  10 mL Intravenous PRN Lequita Asal, MD   10 mL at 04/29/16 0906  . sodium chloride flush (NS) 0.9 % injection 10 mL  10 mL Intravenous PRN Lequita Asal, MD   10 mL at 08/02/16 6578    Review of Systems:  GENERAL: Feels "ok". No fevers or sweats.  Weight down 2 pounds PERFORMANCE STATUS (ECOG): 1 HEENT: No visual changes, runny nose, sore throat, mouth sores or tenderness. Declines dental evaluation. Lungs: Left sided chest discomfort, persistent.No increased shortness of breath.  No pleuritic chest pain.  No hemoptysis. Cardiac: No chest pain, palpitations, orthopnea, or PND. GI: No nausea, vomiting, constipation, melena or hematochezia. GU: No urgency, frequency, dysuria, or hematuria. Musculoskeletal: Left sided musculoskeletal pain.  Lumbar back pain, improved with Tramadol. No muscle tenderness. Extremities: Chronic left lower extremity  edema. No pain. Skin: No rashes or skin changes. Neuro: Short term memory issues.  No headache, numbness or weakness, balance or coordination issues. Endocrine: No diabetes, thyroid issues, hot flashes or night sweats. Psych: No mood changes, depression or anxiety. Pain: Left lower rib rage discomfort.  Back pain, improved. Review of systems: All other systems reviewed and found to be negative  Physical Exam: Blood pressure 98/67, pulse (!) 123, temperature (!) 96.5 F (35.8 C), temperature source Tympanic, resp. rate 18, weight 100 lb 12 oz (45.7 kg), last menstrual period 04/14/2004. GENERAL: Thin woman sitting comfortably in the exam room in no acute distress.  MENTAL STATUS: Alert and oriented to person, place and time. HEAD: Wearing a beige cap.  Normocephalic, atraumatic, face symmetric, no Cushingoid features. EYES: Blue eyes. Pupils equal round and reactive to light and  accomodation. No conjunctivitis or scleral icterus. ENT: Oropharynx clear without lesion. Tongue normal. Mucous membranes moist.  RESPIRATORY: Clear to auscultation without rales, wheezes or rhonchi. CARDIOVASCULAR: Regular rate and rhythm without murmur, rub or gallop. ABDOMEN: Soft, non-tender, with active bowel sounds, and no hepatosplenomegaly. No masses. SKIN: Spider angiomas on chest.  No rashes, ulcers or lesions. EXTREMITIES: No edema, skin discoloration or tenderness. No palpable cords. LYMPH NODES: No palpable cervical, supraclavicular, axillary or inguinal adenopathy  NEUROLOGICAL: Appropriate. PSYCH:  Appropriate.   Infusion on 08/02/2016  Component Date Value Ref Range Status  . WBC 08/02/2016 27.1* 3.6 - 11.0 K/uL Final  . RBC 08/02/2016 2.86* 3.80 - 5.20 MIL/uL Final  . Hemoglobin 08/02/2016 9.5* 12.0 - 16.0 g/dL Final  . HCT 08/02/2016 28.0* 35.0 - 47.0 % Final  . MCV 08/02/2016 98.2  80.0 - 100.0 fL Final  . MCH 08/02/2016 33.3  26.0 - 34.0 pg Final  . MCHC 08/02/2016  33.9  32.0 - 36.0 g/dL Final  . RDW 08/02/2016 17.5* 11.5 - 14.5 % Final  . Platelets 08/02/2016 398  150 - 440 K/uL Final  . Neutrophils Relative % 08/02/2016 87  % Final  . Neutro Abs 08/02/2016 23.6* 1.4 - 6.5 K/uL Final  . Lymphocytes Relative 08/02/2016 5  % Final  . Lymphs Abs 08/02/2016 1.4  1.0 - 3.6 K/uL Final  . Monocytes Relative 08/02/2016 7  % Final  . Monocytes Absolute 08/02/2016 2.0* 0.2 - 0.9 K/uL Final  . Eosinophils Relative 08/02/2016 0  % Final  . Eosinophils Absolute 08/02/2016 0.1  0 - 0.7 K/uL Final  . Basophils Relative 08/02/2016 1  % Final  . Basophils Absolute 08/02/2016 0.1  0 - 0.1 K/uL Final    Assessment:  Christina Mckee is a 55 y.o. female with metastatic squamous cell lung cancer. Molecular studies were negative studies for EGFR, ALK, and ROS1. PD-L1 was 1%.  Chest CT angiogram on 03/05/2016 revealed 8.7 x 5.7 x 3.3 cm mass in the medial aspect of the superior segment of the left lower lobe extending into the medial aspect of the left upper lobe and into the left hilum. There was vascular and endobronchial encasement and narrowing. There were multiple small left lung nodules. Abdomen and pelvic CT scan revealed diffuse hepatic steatosis and no evidence of metastatic disease in the abdomen or pelvis.  CT guided left lower lobe biopsy on 03/29/2016 revealed moderately differentiated squamous cell carcinoma. CEA and LDH were normal on 04/05/2016.  PET scan on 04/14/2016 revealed hypermetabolic left lower lobe mass with hypermetabolic left hilar adenopathy and left hepatic lobe lesion. Findings were consistent with stage IV primary bronchogenic carcinoma. There were mildly hypermetabolic subcarinal lymph node versus mild esophageal uptake. There was hepatic steatosis. Head CT on 04/15/2016 was normal.  CEA (2.6) and LDH (128) were normal on 04/05/2016.  She has a history of an extensive left lower extremity thromboses in 12/2012. Because of the extensive  clot burden, she underwent TPA with angioplasty on 12/28/2012. An IVC filter was placed on 12/29/2012 and still remains. Hypercoagulable workup was negative for Factor V Leiden, prothrombin gene mutation, lupus anticoagulant, and antithrombin III. She was on Xarelto for 6 months.  She received 3 cycles of carboplatin and Abraxane (04/30/2016 - 06/17/2016).  Chest, abdomen, and pelvic CT scan on 07/05/2016 revealed a mixed response.  There was response to therapy of dominant left lower lobe lung mass (5.7 x 3.3 cm to 5.5 x 2.3 cm).  Liver metastasis was  likely progressive (3.4 x 2 cm).  There was development of multiple bilateral renal lesions.  There was new anterior L3 lucent and sclerotic lesion.  Head MRI on 07/24/2016 revealed no evidence of metastatic disease.  Lumbar spine MRI on 07/31/2016 revealed multiple vertebral body bone lesions most consistent with metastatic disease.  There was no evidence of epidural extension.  There was a small left paracentral disc protrusion at T12-L1. There was no evidence of nerve root impingement.  She began monthly Xgeva on 07/15/2016.  She has enrolled on SWOG S1400 (nivolumab +/- ipilimumab).   Symptomatically,  lower back pain has improved at night on tramadol.  She has chronic left lower chest pain felt associated with her mass.  She continues to try to work.  She has hyponatremia (129) and hypokalemia (3.3).  She missed 2 potassium pills yesterday.  Plan: 1.  Labs today:  CBC with diff, CMP, Mg. 2.  Review treatment plan.  Nivolumab every 2 weeks with ipilimumab every 6 weeks. 3.  Cycle #1  nivolumab + ipilimumab. 4.  Discuss prophylactic anticoagulation (Eliquis 2.5 mg BID) given history if DVT, malignancy, and decreased mobility. 5.  Rx:  Eliquis 2.5 mg po BID; dis:#60 with refills. 6.  Consult with Dr. Baruch Gouty today re: back pain.  Patient would like to continue working, but is unable to do so on pain medications. 7.  RTC in 2 weeks for MD  assessment, labs (CBC with diff, CMP, TSH), and cycle #2 nivolumab.Lequita Asal, MD  08/02/2016, 9:10 AM

## 2016-08-02 ENCOUNTER — Inpatient Hospital Stay: Payer: BLUE CROSS/BLUE SHIELD

## 2016-08-02 ENCOUNTER — Other Ambulatory Visit: Payer: Self-pay | Admitting: *Deleted

## 2016-08-02 ENCOUNTER — Inpatient Hospital Stay (HOSPITAL_BASED_OUTPATIENT_CLINIC_OR_DEPARTMENT_OTHER): Payer: BLUE CROSS/BLUE SHIELD | Admitting: Hematology and Oncology

## 2016-08-02 ENCOUNTER — Encounter: Payer: Self-pay | Admitting: *Deleted

## 2016-08-02 ENCOUNTER — Ambulatory Visit
Admission: RE | Admit: 2016-08-02 | Discharge: 2016-08-02 | Disposition: A | Discharge status: Home / Self Care | Payer: BLUE CROSS/BLUE SHIELD | Source: Ambulatory Visit | Attending: Radiation Oncology | Admitting: Radiation Oncology

## 2016-08-02 VITALS — BP 98/67 | HR 123 | Temp 96.5°F | Resp 18 | Wt 100.8 lb

## 2016-08-02 VITALS — BP 112/69 | HR 110 | Resp 20

## 2016-08-02 DIAGNOSIS — Z86718 Personal history of other venous thrombosis and embolism: Secondary | ICD-10-CM

## 2016-08-02 DIAGNOSIS — Z800 Family history of malignant neoplasm of digestive organs: Secondary | ICD-10-CM

## 2016-08-02 DIAGNOSIS — E876 Hypokalemia: Secondary | ICD-10-CM

## 2016-08-02 DIAGNOSIS — Z510 Encounter for antineoplastic radiation therapy: Secondary | ICD-10-CM | POA: Insufficient documentation

## 2016-08-02 DIAGNOSIS — C3432 Malignant neoplasm of lower lobe, left bronchus or lung: Secondary | ICD-10-CM

## 2016-08-02 DIAGNOSIS — C3492 Malignant neoplasm of unspecified part of left bronchus or lung: Secondary | ICD-10-CM

## 2016-08-02 DIAGNOSIS — Z79899 Other long term (current) drug therapy: Secondary | ICD-10-CM

## 2016-08-02 DIAGNOSIS — Z006 Encounter for examination for normal comparison and control in clinical research program: Secondary | ICD-10-CM

## 2016-08-02 DIAGNOSIS — C7951 Secondary malignant neoplasm of bone: Secondary | ICD-10-CM

## 2016-08-02 DIAGNOSIS — E871 Hypo-osmolality and hyponatremia: Secondary | ICD-10-CM

## 2016-08-02 DIAGNOSIS — F17200 Nicotine dependence, unspecified, uncomplicated: Secondary | ICD-10-CM

## 2016-08-02 DIAGNOSIS — C787 Secondary malignant neoplasm of liver and intrahepatic bile duct: Secondary | ICD-10-CM

## 2016-08-02 DIAGNOSIS — G893 Neoplasm related pain (acute) (chronic): Secondary | ICD-10-CM | POA: Insufficient documentation

## 2016-08-02 DIAGNOSIS — R110 Nausea: Secondary | ICD-10-CM

## 2016-08-02 DIAGNOSIS — Z7901 Long term (current) use of anticoagulants: Secondary | ICD-10-CM | POA: Diagnosis not present

## 2016-08-02 DIAGNOSIS — E860 Dehydration: Secondary | ICD-10-CM

## 2016-08-02 DIAGNOSIS — Z780 Asymptomatic menopausal state: Secondary | ICD-10-CM

## 2016-08-02 DIAGNOSIS — Z803 Family history of malignant neoplasm of breast: Secondary | ICD-10-CM

## 2016-08-02 DIAGNOSIS — F17210 Nicotine dependence, cigarettes, uncomplicated: Secondary | ICD-10-CM | POA: Insufficient documentation

## 2016-08-02 DIAGNOSIS — Z806 Family history of leukemia: Secondary | ICD-10-CM

## 2016-08-02 DIAGNOSIS — R630 Anorexia: Secondary | ICD-10-CM

## 2016-08-02 DIAGNOSIS — D72829 Elevated white blood cell count, unspecified: Secondary | ICD-10-CM

## 2016-08-02 LAB — CBC WITH DIFFERENTIAL/PLATELET
Basophils Absolute: 0.1 10*3/uL (ref 0–0.1)
Basophils Relative: 1 %
Eosinophils Absolute: 0.1 10*3/uL (ref 0–0.7)
Eosinophils Relative: 0 %
HCT: 28 % — ABNORMAL LOW (ref 35.0–47.0)
Hemoglobin: 9.5 g/dL — ABNORMAL LOW (ref 12.0–16.0)
Lymphocytes Relative: 5 %
Lymphs Abs: 1.4 10*3/uL (ref 1.0–3.6)
MCH: 33.3 pg (ref 26.0–34.0)
MCHC: 33.9 g/dL (ref 32.0–36.0)
MCV: 98.2 fL (ref 80.0–100.0)
Monocytes Absolute: 2 10*3/uL — ABNORMAL HIGH (ref 0.2–0.9)
Monocytes Relative: 7 %
Neutro Abs: 23.6 10*3/uL — ABNORMAL HIGH (ref 1.4–6.5)
Neutrophils Relative %: 87 %
Platelets: 398 10*3/uL (ref 150–440)
RBC: 2.86 MIL/uL — ABNORMAL LOW (ref 3.80–5.20)
RDW: 17.5 % — ABNORMAL HIGH (ref 11.5–14.5)
WBC: 27.1 10*3/uL — ABNORMAL HIGH (ref 3.6–11.0)

## 2016-08-02 LAB — COMPREHENSIVE METABOLIC PANEL
ALT: 32 U/L (ref 14–54)
AST: 37 U/L (ref 15–41)
Albumin: 2.8 g/dL — ABNORMAL LOW (ref 3.5–5.0)
Alkaline Phosphatase: 157 U/L — ABNORMAL HIGH (ref 38–126)
Anion gap: 8 (ref 5–15)
BUN: <5 mg/dL — ABNORMAL LOW (ref 6–20)
CO2: 22 mmol/L (ref 22–32)
Calcium: 7.7 mg/dL — ABNORMAL LOW (ref 8.9–10.3)
Chloride: 99 mmol/L — ABNORMAL LOW (ref 101–111)
Creatinine, Ser: 0.55 mg/dL (ref 0.44–1.00)
GFR calc Af Amer: >60 mL/min (ref >60)
GFR calc non Af Amer: >60 mL/min (ref >60)
Glucose, Bld: 185 mg/dL — ABNORMAL HIGH (ref 65–99)
Potassium: 3.3 mmol/L — ABNORMAL LOW (ref 3.5–5.1)
Sodium: 129 mmol/L — ABNORMAL LOW (ref 135–145)
Total Bilirubin: 0.4 mg/dL (ref 0.3–1.2)
Total Protein: 6.5 g/dL (ref 6.5–8.1)

## 2016-08-02 LAB — MAGNESIUM: Magnesium: 1.9 mg/dL (ref 1.7–2.4)

## 2016-08-02 MED ORDER — SODIUM CHLORIDE 0.9 % IV SOLN
Freq: Once | INTRAVENOUS | Status: AC
  Administered 2016-08-02: 10:00:00 via INTRAVENOUS
  Filled 2016-08-02: qty 1000

## 2016-08-02 MED ORDER — SODIUM CHLORIDE 0.9% FLUSH
10.0000 mL | INTRAVENOUS | Status: DC | PRN
  Administered 2016-08-02: 10 mL via INTRAVENOUS
  Filled 2016-08-02: qty 10

## 2016-08-02 MED ORDER — SODIUM CHLORIDE 0.9 % IV SOLN
1.0000 mg/kg | Freq: Once | INTRAVENOUS | Status: AC
  Administered 2016-08-02: 45 mg via INTRAVENOUS
  Filled 2016-08-02: qty 9

## 2016-08-02 MED ORDER — SODIUM CHLORIDE 0.9 % IV SOLN
1.0000 mg/kg | Freq: Once | INTRAVENOUS | Status: DC
  Filled 2016-08-02: qty 9

## 2016-08-02 MED ORDER — INV-NIVOLUMAB CHEMO INJECTION 100 MG/10ML SWOG S1400I
3.0000 mg/kg | Freq: Once | INTRAVENOUS | Status: AC
  Administered 2016-08-02: 140 mg via INTRAVENOUS
  Filled 2016-08-02: qty 14

## 2016-08-02 MED ORDER — APIXABAN 2.5 MG PO TABS: 2.5000 mg | ORAL_TABLET | Freq: Two times a day (BID) | ORAL | 3 refills | Status: DC

## 2016-08-02 MED ORDER — HEPARIN SOD (PORK) LOCK FLUSH 100 UNIT/ML IV SOLN: 500.0000 [IU] | Freq: Once | INTRAVENOUS | Status: AC

## 2016-08-02 MED ORDER — HEPARIN SOD (PORK) LOCK FLUSH 100 UNIT/ML IV SOLN
500.0000 [IU] | Freq: Once | INTRAVENOUS | Status: AC | PRN
  Administered 2016-08-02: 500 [IU]
  Filled 2016-08-02: qty 5

## 2016-08-02 NOTE — Progress Notes (Unsigned)
eliquis  

## 2016-08-02 NOTE — Progress Notes (Signed)
Patient continues to be fatigued.  BP 99/68 HR 127. Back pain 7/10.  Chest 5/10.  Took ibuprofen this morning because pain medication makes her too sleepy to drive to appt.

## 2016-08-02 NOTE — Progress Notes (Signed)
Pt potassium  Level low and asked pt if she would be able to gt 1 hour of potassium and she does not want to . Spoke to [place] about her potassium dosing at home and we will need to inc. It.  Spoke to pt and she is takig 50 meq a day but she missed 2 doses sat and 1 dose on Sunday and after speaking to [place] about this she told me to tell her stay on same dose and encourage her not to miss any doses. I did talk to pt and made her aware of the need to make sure she does not miss doses but she has several pills to take every day and she is sick of taking them but I tried to encourage her to take them at home or else it leads to longer time in clinic getting it IV.  She will try to keep on task.

## 2016-08-02 NOTE — Progress Notes (Signed)
[name] returns to clinic this morning for second attempt at Cycle 1, Day 1 infusion of Nivolumab + Ipilimumab per the SWOG S1400I research protocol. She has had her labs collected and still hyponatremic & hypokalemia, though slightly improved today. Her white blood count is 27,000, but per Dr. Mike Gip, she is afebrile and all previous chest xray and urine culture show no evidence of infection. Christina Mckee states she feels fine with the exception of her back pain. States she has gotten some pain relief with use of Tramadol, but does not like to take it because it makes her drowsy and she cannot work. She does look better today physically, and is wearing makeup. Weight still down, and Christina Mckee states she has not yet picked up her Megace because her pharmacy had to special order it. After examining the patient and talking to her, Dr. Mike Gip states she feels the lung cancer is causing the elevated WBC, and has decided to proceed with treatment today. She is also ordering a radiation oncology consult to see if palliative RT would help alleviate some of the patient's back and hip pain. She is also ordering a prophylactic dose of Eliquis since the patient has a history of DVT in order to prevent this is possible. Treatment guide provided to infusion RN and pharmacy notified that patient will be treated today. Patient denies questions and will be scheduled to return to clinic in 2 weeks for her Nivolumab only infusion. Baseline AEs as follows:  Adverse Event Log  Study/Protocol: SWOG S1400I Cycle: C1D1  Event Grade Onset Date Resolved Date Drug Name Attribution Treatment Comments  Hyponatermia 3 07/29/16   unrelated  IV fluids  Hypokalemia 1 07/29/16   unrelated  IV potassium  Weight loss 2 03/29/16   unrelated  Megace Rx 20 lb weight loss in <5 mos  Elevated WBC No grade 07/29/16   unrelated  U/A/ C&S/ CXR  Back Pain 3    unrelated  MRI of spine  Hypocalcemia 1 07/09/16   unrelated  Taking  Xgeva  Hyperglycemia 2 07/29/16   unrelated  Not fasting  Hypoalbuminemia 2 07/29/16   unrelated     Yolande Jolly, BSN, MHA, OCN 08/02/2016 9:30 AM

## 2016-08-02 NOTE — Consult Note (Signed)
Except an outstanding is perfect of Radiation Oncology NEW PATIENT EVALUATION  Name: Christina Mckee  MRN: [id number]  Date:   08/02/2016     DOB: 01/10/1961   This 55 y.o. 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  REFERRING PHYSICIAN: No ref. provider found  CHIEF COMPLAINT:  Chief Complaint  Patient presents with  . Lung Cancer    Pt is here for initial consultation of bone metastasis    DIAGNOSIS: The primary encounter diagnosis was Squamous carcinoma of lung, left ([place]). A diagnosis of Bone metastasis ([place]) was also pertinent to this visit.   PREVIOUS INVESTIGATIONS:  Bone scan CT scans MRI scans all reviewed Pathology report reviewed Clinical notes reviewed  HPI: Patient is a 55 year old female with known stage IV squamous cell carcinoma of the lung originally diagnosed back in May 2017 by CT-guided biopsy of the left lower lobe. Initial PET CT scan demonstrated hypermetabolic activity in the left lower lobe with left hilar adenopathy, as well as left hepatic lobe lesion. Patient has a history of extensive left lower extremity thrombosis in 2014. She received carboplatinum and Abraxane has gone on to develop multiple bilateral renal lesions consistent with metastatic disease as well as an anterior L3 lucent and sclerotic lesion consistent with metastatic disease. She is currently on protocol immunotherapy. She is complaining of increasing lower back pain and difficulty ambulating. She is on tramadol for pain causing her to have difficulty working and I been asked to evaluate the patient for possible palliative radiation therapy to her spine. Her PET CT scan does demonstrate metastatic disease within her pelvis in the region of the SI joints also. She is able to ambulate. She's having no motor or sensory levels at this time. MRI scan of her lumbar spine demonstrates metastatic involvement at multiple  levels.  PLANNED TREATMENT REGIMEN: Palliative radiation therapy to SI joints and lumbar spine  PAST MEDICAL HISTORY:  has a past medical history of DVT (deep venous thrombosis) ([place]); Menopause; and Squamous cell lung cancer ([place]).    PAST SURGICAL HISTORY:  Past Surgical History:  Procedure Laterality Date  . BREAST CYST EXCISION     left breast removal  . CESAREAN SECTION    . IVC FILTER PLACEMENT ([facility] HX)    . PERIPHERAL VASCULAR CATHETERIZATION N/A 04/28/2016   Procedure: Glori Luis Cath Insertion;  Surgeon: Algernon Huxley, MD;  Location: [facility];  Service: Cardiovascular;  Laterality: N/A;    FAMILY HISTORY: family history includes Breast cancer in her maternal aunt and mother; Leukemia in her paternal uncle; Stomach cancer in her paternal uncle.  SOCIAL HISTORY:  reports that she has been smoking Cigarettes.  She has been smoking about 0.25 packs per day. She has never used smokeless tobacco. She reports that she drinks alcohol. She reports that she does not use drugs.  ALLERGIES: Penicillins  MEDICATIONS:  Current Outpatient Prescriptions  Medication Sig Dispense Refill  . apixaban (ELIQUIS) 2.5 MG TABS tablet Take 1 tablet (2.5 mg total) by mouth 2 (two) times daily. 60 tablet 3  . guaifenesin (ROBITUSSIN) 100 MG/5ML syrup Take 200 mg by mouth 3 (three) times daily as needed for cough or congestion.    Marland Kitchen guaiFENesin-codeine (CHERATUSSIN AC) 100-10 MG/5ML syrup Take 5 mLs by mouth 3 (three) times daily as needed for cough. 120 mL 0  . HYDROcodone-acetaminophen (NORCO) 5-325 MG tablet Take 1 tablet by mouth every 6 (six) hours  as needed for moderate pain. 40 tablet 0  . ibuprofen (ADVIL,MOTRIN) 200 MG tablet Take by mouth.    . lidocaine-prilocaine (EMLA) cream Apply 1 application topically as needed. 30 g 6  . megestrol (MEGACE) 400 MG/10ML suspension Take 5 mLs (200 mg total) by mouth daily. 150 mL 1  . metoprolol tartrate (LOPRESSOR) 25 MG tablet Take 1 tablet (25 mg  total) by mouth 2 (two) times daily. 60 tablet 2  . nicotine (NICODERM CQ - DOSED IN MG/24 HOURS) 21 mg/24hr patch Place 1 patch (21 mg total) onto the skin daily. 28 patch 0  . ondansetron (ZOFRAN) 8 MG tablet TAKE 1 TABLET BY MOUTH TWICE DAILY AS NEEDED FOR NAUSEA OR VOMITING  1  . potassium chloride (K-DUR) 10 MEQ tablet Take 3 tabs PO in the morning and 2 tabs PO in the evening 150 tablet 0  . prochlorperazine (COMPAZINE) 10 MG tablet Take 1 tablet (10 mg total) by mouth every 6 (six) hours as needed for nausea or vomiting. 30 tablet 6  . traMADol (ULTRAM) 50 MG tablet Take 1 tablet (50 mg total) by mouth every 6 (six) hours as needed. 30 tablet 0   No current facility-administered medications for this encounter.    Facility-Administered Medications Ordered in Other Encounters  Medication Dose Route Frequency Provider Last Rate Last Dose  . sodium chloride flush (NS) 0.9 % injection 10 mL  10 mL Intravenous PRN Lequita Asal, MD   10 mL at 04/29/16 0906  . sodium chloride flush (NS) 0.9 % injection 10 mL  10 mL Intravenous PRN Lequita Asal, MD   10 mL at 08/02/16 0814    ECOG PERFORMANCE STATUS:  1 - Symptomatic but completely ambulatory  REVIEW OF SYSTEMS: Except for the significant back pain and occasional pain in her chest Patient denies any weight loss, fatigue, weakness, fever, chills or night sweats. Patient denies any loss of vision, blurred vision. Patient denies any ringing  of the ears or hearing loss. No irregular heartbeat. Patient denies heart murmur or history of fainting. Patient denies any chest pain or pain radiating to her upper extremities. Patient denies any shortness of breath, difficulty breathing at night, cough or hemoptysis. Patient denies any swelling in the lower legs. Patient denies any nausea vomiting, vomiting of blood, or coffee ground material in the vomitus. Patient denies any stomach pain. Patient states has had normal bowel movements no significant  constipation or diarrhea. Patient denies any dysuria, hematuria or significant nocturia. Patient denies any problems walking, swelling in the joints or loss of balance. Patient denies any skin changes, loss of hair or loss of weight. Patient denies any excessive worrying or anxiety or significant depression. Patient denies any problems with insomnia. Patient denies excessive thirst, polyuria, polydipsia. Patient denies any swollen glands, patient denies easy bruising or easy bleeding. Patient denies any recent infections, allergies or URI. Patient "s visual fields have not changed significantly in recent time.    PHYSICAL EXAM: LMP 04/14/2004 (Approximate) Comment: >12 years Thin slightly cachectic female in NAD. Pain is elicited on deep palpation around the L1 region. Motor sensory and DTR levels are equal and symmetric in the upper lower extremities. Proprioception is intact. Well-developed well-nourished patient in NAD. HEENT reveals PERLA, EOMI, discs not visualized.  Oral cavity is clear. No oral mucosal lesions are identified. Neck is clear without evidence of cervical or supraclavicular adenopathy. Lungs are clear to A&P. Cardiac examination is essentially unremarkable with regular rate and rhythm without  murmur rub or thrill. Abdomen is benign with no organomegaly or masses noted. Motor sensory and DTR levels are equal and symmetric in the upper and lower extremities. Cranial nerves II through XII are grossly intact. Proprioception is intact. No peripheral adenopathy or edema is identified. No motor or sensory levels are noted. Crude visual fields are within normal range.  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 Would plan on delivering 3000 cGy in 15 fractions. I would use a more extended treatment course of radiation based on the large quantity of bowel both colon and small bowel in her treatments field from treating the entire lumbar spine and SI joints. Risks and benefits of treatment including diarrhea, possible increased lower urinary tract symptoms, fatigue, alteration of blood counts, skin reaction all were discussed in detail with the patient. I personally set up and ordered CT simulation for later this week.There will be extra effort by both professional staff as well as technical staff to coordinate and manage concurrent chemoradiation and ensuing side effects during her treatments.  I would like to take this opportunity to thank you for allowing me to participate in the care of your patient.Armstead Peaks., MD

## 2016-08-03 ENCOUNTER — Ambulatory Visit
Admission: RE | Admit: 2016-08-03 | Discharge: 2016-08-03 | Disposition: A | Discharge status: Home / Self Care | Payer: BLUE CROSS/BLUE SHIELD | Source: Ambulatory Visit | Attending: Radiation Oncology | Admitting: Radiation Oncology

## 2016-08-03 ENCOUNTER — Other Ambulatory Visit: Payer: Self-pay | Admitting: Hematology and Oncology

## 2016-08-03 DIAGNOSIS — C3492 Malignant neoplasm of unspecified part of left bronchus or lung: Secondary | ICD-10-CM

## 2016-08-03 DIAGNOSIS — C7951 Secondary malignant neoplasm of bone: Secondary | ICD-10-CM | POA: Diagnosis not present

## 2016-08-04 ENCOUNTER — Telehealth: Payer: Self-pay

## 2016-08-04 NOTE — Telephone Encounter (Signed)
T/C to patient to follow up on initial treatment on 08/02/2016. No answer. Left VM message. Mirian Mo, RN, BSN 08/04/2016 4:33 PM

## 2016-08-04 NOTE — Telephone Encounter (Signed)
Patient returned phone call @ 5:30pm. The patient states she has been feeling some nausea and some back pain. She states she does take Tramadol for the back pain but it takes about an hour before she feels relief. The patient also reports that her STD has been denied and that she is willing to speak with our PSN to discuss possible resources. Email was sent to PSN for referral. Will follow up with patient tomorrow. Mirian Mo, RN, BSN 08/04/2016 5:51 PM

## 2016-08-05 ENCOUNTER — Ambulatory Visit: Payer: BLUE CROSS/BLUE SHIELD

## 2016-08-05 ENCOUNTER — Other Ambulatory Visit: Payer: Self-pay | Admitting: *Deleted

## 2016-08-05 ENCOUNTER — Telehealth: Payer: Self-pay

## 2016-08-05 ENCOUNTER — Encounter: Payer: Self-pay | Admitting: *Deleted

## 2016-08-05 ENCOUNTER — Telehealth: Payer: Self-pay | Admitting: *Deleted

## 2016-08-05 DIAGNOSIS — C3492 Malignant neoplasm of unspecified part of left bronchus or lung: Secondary | ICD-10-CM

## 2016-08-05 DIAGNOSIS — C7951 Secondary malignant neoplasm of bone: Secondary | ICD-10-CM | POA: Diagnosis not present

## 2016-08-05 DIAGNOSIS — C787 Secondary malignant neoplasm of liver and intrahepatic bile duct: Secondary | ICD-10-CM

## 2016-08-05 MED ORDER — TRAMADOL HCL 50 MG PO TABS: 50.0000 mg | ORAL_TABLET | Freq: Four times a day (QID) | ORAL | 0 refills | Status: AC | PRN

## 2016-08-05 MED ORDER — ONDANSETRON HCL 8 MG PO TABS: 8.0000 mg | ORAL_TABLET | Freq: Three times a day (TID) | ORAL | 1 refills | Status: AC | PRN

## 2016-08-05 NOTE — Telephone Encounter (Signed)
Pt called and spoke to [facility] in radiation and said that pt needs letter for work. I called pt back and she tells me that her work needs a note for her to work.  I asked her is she going back to work and she said she has to because she is not eligible for disability until she works til nov 1.  I asked her what she wants me to write on the letter.  At first she has no clue but I asked if she was going to take tramadol at work and she said no because it makes her sleepy. She will only take ibruprofen at work.  She wants to be able to eat at her desk and if she needs to go home due to pain from mets or nausea then allow her to.  I told her I would write a letter and leave it for her at front desk for tom.  She also asked for a refill of tramadol. She is only take 2 a day.  I told her based on that she has only rx for 6 days.  I can give her a new rx but she may not be able to fill it right now.  She was worried about running out and I told her based on how often she is taking it she should not run out but if she inc. The amount she is taking she could possibly.  She will pick up rx tom also.She vomited this am and is drinking fluids but when she gets nauseated and then takes medicine it takes a while to get feeling better and I encouraged to take ondansetron Q8 hours after I spoke to [place] and called in refill with inc. In [facility] because that is the pharmacy she wants it to go to.

## 2016-08-05 NOTE — Telephone Encounter (Signed)
Patient called to report that she is continuing to experience nausea which started on Monday following her chemo treatment. The patient reports she has contacted Dr. Mike Gip regarding the nausea and she prescribed another pain medication which she will pick up from her pharmacy. She states she is having difficulty keeping food down and only fluids are staying down at this time. Instructed the patient to continue her fluids and pain medication as directed. She verbalized understanding. Also informed the patient of her appointment on Tuesday with the PSN following her radiation appointment. Again, the patient verbalized understanding.  Mirian Mo, RN, BSN 08/05/2016 3:33 PM

## 2016-08-06 ENCOUNTER — Other Ambulatory Visit: Payer: Self-pay

## 2016-08-06 ENCOUNTER — Telehealth: Payer: Self-pay | Admitting: *Deleted

## 2016-08-06 ENCOUNTER — Encounter: Payer: Self-pay | Admitting: *Deleted

## 2016-08-06 ENCOUNTER — Inpatient Hospital Stay: Payer: BLUE CROSS/BLUE SHIELD

## 2016-08-06 DIAGNOSIS — C3432 Malignant neoplasm of lower lobe, left bronchus or lung: Secondary | ICD-10-CM | POA: Diagnosis not present

## 2016-08-06 DIAGNOSIS — C7951 Secondary malignant neoplasm of bone: Secondary | ICD-10-CM

## 2016-08-06 DIAGNOSIS — C787 Secondary malignant neoplasm of liver and intrahepatic bile duct: Secondary | ICD-10-CM

## 2016-08-06 DIAGNOSIS — C3492 Malignant neoplasm of unspecified part of left bronchus or lung: Secondary | ICD-10-CM

## 2016-08-06 LAB — BASIC METABOLIC PANEL
Anion gap: 10 (ref 5–15)
BUN: <5 mg/dL — ABNORMAL LOW (ref 6–20)
CO2: 21 mmol/L — ABNORMAL LOW (ref 22–32)
Calcium: 7.7 mg/dL — ABNORMAL LOW (ref 8.9–10.3)
Chloride: 100 mmol/L — ABNORMAL LOW (ref 101–111)
Creatinine, Ser: 0.76 mg/dL (ref 0.44–1.00)
GFR calc Af Amer: >60 mL/min (ref >60)
GFR calc non Af Amer: >60 mL/min (ref >60)
Glucose, Bld: 187 mg/dL — ABNORMAL HIGH (ref 65–99)
Potassium: 4.7 mmol/L (ref 3.5–5.1)
Sodium: 131 mmol/L — ABNORMAL LOW (ref 135–145)

## 2016-08-06 NOTE — Telephone Encounter (Signed)
Needs to speak with Judeen Hammans regarding the letter to return to work, it is not sufficient and needs to discuss with you. Please return her call.

## 2016-08-07 ENCOUNTER — Encounter: Payer: Self-pay | Admitting: Hematology and Oncology

## 2016-08-09 NOTE — Telephone Encounter (Signed)
I did call pt and speak to her several times about the letter. I gave her one based on what she wanted it to say for her to be abel to work. Then when she took it to her boss she did not think it had enough imfo on it.  Pt. Gave me the boss's phone number and I spoke to her and she states that she needs a when to rtn to work.  All the things on previous letter they were able to accommodate but just needed it to say that she can rtn to work.  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  The patient also came over here at 16:48 to pick up the 2nd letter.

## 2016-08-10 ENCOUNTER — Ambulatory Visit
Admission: RE | Admit: 2016-08-10 | Discharge: 2016-08-10 | Disposition: A | Discharge status: Home / Self Care | Payer: BLUE CROSS/BLUE SHIELD | Source: Ambulatory Visit | Attending: Radiation Oncology | Admitting: Radiation Oncology

## 2016-08-10 DIAGNOSIS — C7951 Secondary malignant neoplasm of bone: Secondary | ICD-10-CM | POA: Diagnosis not present

## 2016-08-11 ENCOUNTER — Telehealth: Payer: Self-pay | Admitting: *Deleted

## 2016-08-11 ENCOUNTER — Ambulatory Visit
Admission: RE | Admit: 2016-08-11 | Discharge: 2016-08-11 | Disposition: A | Discharge status: Home / Self Care | Payer: BLUE CROSS/BLUE SHIELD | Source: Ambulatory Visit | Attending: Radiation Oncology | Admitting: Radiation Oncology

## 2016-08-11 DIAGNOSIS — C7951 Secondary malignant neoplasm of bone: Secondary | ICD-10-CM | POA: Diagnosis not present

## 2016-08-11 MED ORDER — BENZONATATE 100 MG PO CAPS: 100.0000 mg | ORAL_CAPSULE | Freq: Three times a day (TID) | ORAL | 0 refills | Status: DC | PRN

## 2016-08-11 NOTE — Progress Notes (Unsigned)
PSN met with patient yesterday to discuss financial issues and disability. 

## 2016-08-11 NOTE — Telephone Encounter (Signed)
Patient states she has been taking her Zofran every 8 hours around the clock and is still nauseated and vomiting every day.  She also c/o hacking cough.     Per Dr. Mike Gip patient should take Compazine 5 mg (1/2 tablet of what she was prescribed) every 6 hours.  If that does not help then she can call prescription for Phenergan or Ativan 0.5 mg.  However, if patient is taking Ativan she will not be able to drive or operate machinery.  She can also prescribe scapolamine patches, however, patient will have to have eye exam to rule out glaucoma prior to prescribing patches.  As for cough, will prescribe Tessalon Pearles 100 mg.  Called patient back and LVM regarding the above instructions.  Patient advised to call us back if she does not get any better.  E-scribed Tessalon Pearles.

## 2016-08-12 ENCOUNTER — Ambulatory Visit
Admission: RE | Admit: 2016-08-12 | Discharge: 2016-08-12 | Disposition: A | Discharge status: Home / Self Care | Payer: BLUE CROSS/BLUE SHIELD | Source: Ambulatory Visit | Attending: Radiation Oncology | Admitting: Radiation Oncology

## 2016-08-12 DIAGNOSIS — C7951 Secondary malignant neoplasm of bone: Secondary | ICD-10-CM | POA: Diagnosis not present

## 2016-08-13 ENCOUNTER — Ambulatory Visit
Admission: RE | Admit: 2016-08-13 | Discharge: 2016-08-13 | Disposition: A | Discharge status: Home / Self Care | Payer: BLUE CROSS/BLUE SHIELD | Source: Ambulatory Visit | Attending: Radiation Oncology | Admitting: Radiation Oncology

## 2016-08-13 DIAGNOSIS — C7951 Secondary malignant neoplasm of bone: Secondary | ICD-10-CM | POA: Diagnosis not present

## 2016-08-15 ENCOUNTER — Other Ambulatory Visit: Payer: Self-pay | Admitting: Hematology and Oncology

## 2016-08-15 DIAGNOSIS — C3492 Malignant neoplasm of unspecified part of left bronchus or lung: Secondary | ICD-10-CM

## 2016-08-15 NOTE — Progress Notes (Signed)
[facility] day:  08/16/2016   Chief Complaint: Christina Mckee is a 55 y.o. female with metastatic squamous cell lung cancer who is seen for assessment prior to cycle #2 nivolumab + ipilimumab on SWOG S1400.   HPI: The patient was last seen in the medical oncology clinic on 08/02/2016.  At that time,  lower back pain had improved at night on Tramadol.  She had chronic left lower chest pain felt associated with her mass.  She had hyponatremia (129) and hypokalemia (3.3).  She missed 2 potassium pills.  She received cycle #1 nivolumab + ipilimumab.  A prescription was written for low dose Eliquis for DVT prophylaxis.  She was to follow-up with Dr. Baruch Gouty for palliative radiation.  She saw Dr. Baruch Gouty on 08/02/2016.  She started radiation on 08/11/2016.  She states that her hip and lower back are being radiated.  She has not had much pain relief yet.  She has pain medications, but does not use them at work.  She takes ibuprofen at work.  Tramadol and hydrocodone/Tylenol both relieve the pain, but make her drowsy.  She has had some nausea for which she has used ondansetron and Compazine with minimal success.  She feels like there is a ball inflated in her upper abdomen or chest.   She has used products with simethicone.  She did not fill the Eliquis prescription secondary to cost issues.  She did not take her potassium for the past 2 days.   Past Medical History:  Diagnosis Date  . DVT (deep venous thrombosis) ([place])    in 2015  . Menopause    prior to 2014  . Squamous cell lung cancer [facility])     Past Surgical History:  Procedure Laterality Date  . BREAST CYST EXCISION     left breast removal  . CESAREAN SECTION    . IVC FILTER PLACEMENT ([facility] HX)    . PERIPHERAL VASCULAR CATHETERIZATION N/A 04/28/2016   Procedure: Glori Luis Cath Insertion;  Surgeon: Algernon Huxley, MD;  Location: [facility];  Service: Cardiovascular;  Laterality: N/A;     Family History  Problem Relation Age of Onset  . Breast cancer Mother   . Breast cancer Maternal Aunt   . Stomach cancer Paternal Uncle   . Leukemia Paternal Uncle     Social History:  reports that she has been smoking Cigarettes.  She has been smoking about 0.25 packs per day. She has never used smokeless tobacco. She reports that she drinks alcohol. She reports that she does not use drugs.  She has smoked 1/2-1 pack a day since age 58. She has a 30-40 pack year smoking history.  She drinks 4 beers a day. She lives in [place] alone with her cats and dogs. She has a dog named Production assistant, radio (lab and shepherd mix).  She previously worked at [employer], but notes all positions were recently eliminated. She continues to work.  Thursdays are better treatment days for her.  Her daughter is getting married on 08/01/2016.  She is off of work on 08/02/2016 to run errands with her daughter.  The patient is accompanied by the protocol nurses  Mirian Mo and [facility]) today.  Allergies:  Allergies  Allergen Reactions  . Penicillins Hives and Other (See Comments)    Has patient had a PCN reaction causing immediate rash, facial/tongue/throat swelling, SOB or lightheadedness with hypotension: No Has patient had a PCN reaction causing severe rash involving  mucus membranes or skin necrosis: No Has patient had a PCN reaction that required hospitalization No Has patient had a PCN reaction occurring within the last 10 years: No If all of the above answers are "NO", then may proceed with Cephalosporin use.    Current Medications: Current Outpatient Prescriptions  Medication Sig Dispense Refill  . apixaban (ELIQUIS) 2.5 MG TABS tablet Take 1 tablet (2.5 mg total) by mouth 2 (two) times daily. 60 tablet 3  . benzonatate (TESSALON PERLES) 100 MG capsule Take 1 capsule (100 mg total) by mouth 3 (three) times daily as needed for cough. 20 capsule 0  . GuaiFENesin (MUCINEX PO) Take by mouth.    . guaifenesin  (ROBITUSSIN) 100 MG/5ML syrup Take 200 mg by mouth 3 (three) times daily as needed for cough or congestion.    Marland Kitchen guaiFENesin-codeine (CHERATUSSIN AC) 100-10 MG/5ML syrup Take 5 mLs by mouth 3 (three) times daily as needed for cough. 120 mL 0  . HYDROcodone-acetaminophen (NORCO) 5-325 MG tablet Take 1 tablet by mouth every 6 (six) hours as needed for moderate pain. 40 tablet 0  . ibuprofen (ADVIL,MOTRIN) 200 MG tablet Take by mouth.    Marland Kitchen KLOR-CON 10 10 MEQ tablet TAKE 3 TABS BY MOUTH IN THE MORNING AND 2 TABS BY MOUTH IN THE EVENING 150 tablet 0  . lidocaine-prilocaine (EMLA) cream Apply 1 application topically as needed. 30 g 6  . megestrol (MEGACE) 400 MG/10ML suspension Take 5 mLs (200 mg total) by mouth daily. 150 mL 1  . metoprolol tartrate (LOPRESSOR) 25 MG tablet Take 1 tablet (25 mg total) by mouth 2 (two) times daily. 60 tablet 2  . nicotine (NICODERM CQ - DOSED IN MG/24 HOURS) 21 mg/24hr patch Place 1 patch (21 mg total) onto the skin daily. 28 patch 0  . ondansetron (ZOFRAN) 8 MG tablet Take 1 tablet (8 mg total) by mouth every 8 (eight) hours as needed for nausea or vomiting. 60 tablet 1  . prochlorperazine (COMPAZINE) 10 MG tablet Take 1 tablet (10 mg total) by mouth every 6 (six) hours as needed for nausea or vomiting. 30 tablet 6  . traMADol (ULTRAM) 50 MG tablet Take 1 tablet (50 mg total) by mouth every 6 (six) hours as needed. 60 tablet 0   No current facility-administered medications for this visit.    Facility-Administered Medications Ordered in Other Visits  Medication Dose Route Frequency Provider Last Rate Last Dose  . heparin lock flush 100 unit/mL  500 Units Intravenous Once Lequita Asal, MD      . sodium chloride flush (NS) 0.9 % injection 10 mL  10 mL Intravenous PRN Lequita Asal, MD   10 mL at 04/29/16 0906  . sodium chloride flush (NS) 0.9 % injection 10 mL  10 mL Intravenous Once Lequita Asal, MD      . sodium chloride flush (NS) 0.9 % injection 10  mL  10 mL Intravenous Once Lequita Asal, MD        Review of Systems:  GENERAL: Fatigue. No fevers or sweats.  Weight down 2 pounds PERFORMANCE STATUS (ECOG): 1 HEENT: No visual changes, runny nose, sore throat, mouth sores or tenderness. Declines dental evaluation. Lungs:  Left sided chest discomfort, persistent.No increased shortness of breath.  No pleuritic chest pain.  No hemoptysis. Cardiac: No chest pain, palpitations, orthopnea, or PND. GI: Fullness in upper abdomen.  Nausea.  No vomiting, constipation, melena or hematochezia. GU: No urgency, frequency, dysuria, or hematuria. Musculoskeletal: Left  sided musculoskeletal pain.  Lumbar back pain, improved with Tramadol. No muscle tenderness. Extremities: Chronic mild left lower extremity edema. No pain. Skin: No rashes or skin changes. Neuro: Short term memory issues.  No headache, numbness or weakness, balance or coordination issues. Endocrine: No diabetes, thyroid issues, hot flashes or night sweats. Psych: No mood changes, depression or anxiety. Pain: Left lower rib rage discomfort.  Back pain. Review of systems: All other systems reviewed and found to be negative  Physical Exam: Blood pressure 106/73, pulse (!) 110, temperature (!) 96.9 F (36.1 C), temperature source Tympanic, resp. rate 18, weight 98 lb 8.7 oz (44.7 kg), last menstrual period 04/14/2004, SpO2 100 %. GENERAL: Thin woman sitting comfortably in the exam room in no acute distress.  MENTAL STATUS: Alert and oriented to person, place and time. HEAD: Thin short gray hair.  Normocephalic, atraumatic, face symmetric, no Cushingoid features. EYES: Blue eyes. Pupils equal round and reactive to light and accomodation. No conjunctivitis or scleral icterus. ENT: Oropharynx clear without lesion. Tongue normal. Mucous membranes moist.  RESPIRATORY: Clear to auscultation without rales, wheezes or rhonchi. CARDIOVASCULAR: Regular rate and rhythm  without murmur, rub or gallop. ABDOMEN: Soft, non-tender, with active bowel sounds, and no hepatosplenomegaly. No masses. SKIN: Spider angiomas on chest.  No rashes, ulcers or lesions. EXTREMITIES: No edema, skin discoloration or tenderness. No palpable cords. LYMPH NODES: No palpable cervical, supraclavicular, axillary or inguinal adenopathy  NEUROLOGICAL: Appropriate. PSYCH:  Appropriate.   Infusion on 08/16/2016  Component Date Value Ref Range Status  . WBC 08/16/2016 18.1* 3.6 - 11.0 K/uL Final  . RBC 08/16/2016 2.38* 3.80 - 5.20 MIL/uL Final  . Hemoglobin 08/16/2016 7.7* 12.0 - 16.0 g/dL Final  . HCT 08/16/2016 23.2* 35.0 - 47.0 % Final  . MCV 08/16/2016 97.4  80.0 - 100.0 fL Final  . MCH 08/16/2016 32.6  26.0 - 34.0 pg Final  . MCHC 08/16/2016 33.4  32.0 - 36.0 g/dL Final  . RDW 08/16/2016 17.2* 11.5 - 14.5 % Final  . Platelets 08/16/2016 278  150 - 440 K/uL Final  . Neutrophils Relative % 08/16/2016 90  % Final  . Neutro Abs 08/16/2016 16.3* 1.4 - 6.5 K/uL Final  . Lymphocytes Relative 08/16/2016 5  % Final  . Lymphs Abs 08/16/2016 0.9* 1.0 - 3.6 K/uL Final  . Monocytes Relative 08/16/2016 4  % Final  . Monocytes Absolute 08/16/2016 0.8  0.2 - 0.9 K/uL Final  . Eosinophils Relative 08/16/2016 0  % Final  . Eosinophils Absolute 08/16/2016 0.1  0 - 0.7 K/uL Final  . Basophils Relative 08/16/2016 1  % Final  . Basophils Absolute 08/16/2016 0.1  0 - 0.1 K/uL Final  . Sodium 08/16/2016 133* 135 - 145 mmol/L Final  . Potassium 08/16/2016 3.0* 3.5 - 5.1 mmol/L Final  . Chloride 08/16/2016 103  101 - 111 mmol/L Final  . CO2 08/16/2016 21* 22 - 32 mmol/L Final  . Glucose, Bld 08/16/2016 236* 65 - 99 mg/dL Final  . BUN 08/16/2016 6  6 - 20 mg/dL Final  . Creatinine, Ser 08/16/2016 0.48  0.44 - 1.00 mg/dL Final  . Calcium 08/16/2016 7.9* 8.9 - 10.3 mg/dL Final  . Total Protein 08/16/2016 5.9* 6.5 - 8.1 g/dL Final  . Albumin 08/16/2016 2.4* 3.5 - 5.0 g/dL Final  . AST  08/16/2016 27  15 - 41 U/L Final  . ALT 08/16/2016 18  14 - 54 U/L Final  . Alkaline Phosphatase 08/16/2016 98  38 - 126 U/L  Final  . Total Bilirubin 08/16/2016 0.6  0.3 - 1.2 mg/dL Final  . GFR calc non Af Amer 08/16/2016 >60  >60 mL/min Final  . GFR calc Af Amer 08/16/2016 >60  >60 mL/min Final   Comment: (NOTE) The eGFR has been calculated using the CKD EPI equation. This calculation has not been validated in all clinical situations. eGFR's persistently <60 mL/min signify possible Chronic Kidney Disease.   . Anion gap 08/16/2016 9  5 - 15 Final  . Magnesium 08/16/2016 2.0  1.7 - 2.4 mg/dL Final    Assessment:  Christina Mckee is a 55 y.o. female with metastatic squamous cell lung cancer. Molecular studies were negative studies for EGFR, ALK, and ROS1. PD-L1 was 1%.  Chest CT angiogram on 03/05/2016 revealed 8.7 x 5.7 x 3.3 cm mass in the medial aspect of the superior segment of the left lower lobe extending into the medial aspect of the left upper lobe and into the left hilum. There was vascular and endobronchial encasement and narrowing. There were multiple small left lung nodules. Abdomen and pelvic CT scan revealed diffuse hepatic steatosis and no evidence of metastatic disease in the abdomen or pelvis.  CT guided left lower lobe biopsy on 03/29/2016 revealed moderately differentiated squamous cell carcinoma. CEA and LDH were normal on 04/05/2016.  PET scan on 04/14/2016 revealed hypermetabolic left lower lobe mass with hypermetabolic left hilar adenopathy and left hepatic lobe lesion. Findings were consistent with stage IV primary bronchogenic carcinoma. There were mildly hypermetabolic subcarinal lymph node versus mild esophageal uptake. There was hepatic steatosis. Head CT on 04/15/2016 was normal.  CEA (2.6) and LDH (128) were normal on 04/05/2016.  She has a history of an extensive left lower extremity thromboses in 12/2012. Because of the extensive clot burden, she underwent  TPA with angioplasty on 12/28/2012. An IVC filter was placed on 12/29/2012 and still remains. Hypercoagulable workup was negative for Factor V Leiden, prothrombin gene mutation, lupus anticoagulant, and antithrombin III. She was on Xarelto for 6 months.  She declined low dose Eliquis secondary to cost.  She received 3 cycles of carboplatin and Abraxane (04/30/2016 - 06/17/2016).  Chest, abdomen, and pelvic CT scan on 07/05/2016 revealed a mixed response.  There was response to therapy of dominant left lower lobe lung mass (5.7 x 3.3 cm to 5.5 x 2.3 cm).  Liver metastasis was likely progressive (3.4 x 2 cm).  There was development of multiple bilateral renal lesions.  There was new anterior L3 lucent and sclerotic lesion.  Head MRI on 07/24/2016 revealed no evidence of metastatic disease.  Lumbar spine MRI on 07/31/2016 revealed multiple vertebral body bone lesions most consistent with metastatic disease.  There was no evidence of epidural extension.  There was a small left paracentral disc protrusion at T12-L1. There was no evidence of nerve root impingement.  She began monthly Xgeva on 07/15/2016.  She received cycle #1 nivolumab + ipilumumab on SWOG S1400 (08/02/2016).  She began palliative radiation on 08/11/2016.  Symptomatically, pain is controlled with Tramadol or hydrocodone/Tylenol, but is not used so she can work.  She uses ibuprofen prn.  She has nausea poorly managed by ondansetron and Compazine.  She has weight loss and progressive anemia likely due to poor oral intake.  She has hypokalemia after missing 2 days of oral potassium.  She continues to work.  Plan: 1.  Labs today:  CBC with diff, CMP, TSH. 2.  Work-up of anemia:  ferritin, iron studies, B12, folate. 3.  Encourage caloric intake.  Discuss management of nausea.  Discuss trial of low dose Reglan.  Consider scopolamine patch if patient has eye check (r/o glaucoma). 4.  Preauth Procrit. 5.  Cycle #2  nivolumab alone. 6.  Continue  radiation. 7.  Discuss pain management.  Discuss low dose Fentanyl patch (12 mcg/hr)- patient declines. 8.  Discuss IV potassium supplementation.  Patient declines.  Discuss increase potassium to 20 mg TID x 2 days then back to 20 meq BID. 9.  Discuss prophylactic anticoagulation.  Patient declines at this time.  She will notify clinic if any concerns. 10.  Consider low dose prednisone (10 mg/day) for pain relief (anti-inflammatory effect). 11.  RTC in 1 week for labs (CBC, BMP) +/- Procrit 12.  RTC in 2 weeks for MD assessment, labs (CBC with diff, CMP, Mg) + cycle #3 nivolumab.   Lequita Asal, MD  08/16/2016, 9:15 AM

## 2016-08-16 ENCOUNTER — Telehealth: Payer: Self-pay | Admitting: *Deleted

## 2016-08-16 ENCOUNTER — Inpatient Hospital Stay: Payer: BLUE CROSS/BLUE SHIELD

## 2016-08-16 ENCOUNTER — Ambulatory Visit
Admission: RE | Admit: 2016-08-16 | Discharge: 2016-08-16 | Disposition: A | Discharge status: Home / Self Care | Payer: BLUE CROSS/BLUE SHIELD | Source: Ambulatory Visit | Attending: Radiation Oncology | Admitting: Radiation Oncology

## 2016-08-16 ENCOUNTER — Other Ambulatory Visit: Payer: Self-pay | Admitting: *Deleted

## 2016-08-16 ENCOUNTER — Encounter: Payer: Self-pay | Admitting: Hematology and Oncology

## 2016-08-16 ENCOUNTER — Inpatient Hospital Stay (HOSPITAL_BASED_OUTPATIENT_CLINIC_OR_DEPARTMENT_OTHER): Payer: BLUE CROSS/BLUE SHIELD | Admitting: Hematology and Oncology

## 2016-08-16 ENCOUNTER — Other Ambulatory Visit: Payer: Self-pay | Admitting: Hematology and Oncology

## 2016-08-16 VITALS — BP 106/73 | HR 110 | Temp 96.9°F | Resp 18 | Wt 98.5 lb

## 2016-08-16 VITALS — BP 110/68 | HR 100 | Temp 96.1°F | Resp 18

## 2016-08-16 DIAGNOSIS — Z780 Asymptomatic menopausal state: Secondary | ICD-10-CM

## 2016-08-16 DIAGNOSIS — G893 Neoplasm related pain (acute) (chronic): Secondary | ICD-10-CM

## 2016-08-16 DIAGNOSIS — Z006 Encounter for examination for normal comparison and control in clinical research program: Secondary | ICD-10-CM

## 2016-08-16 DIAGNOSIS — C7951 Secondary malignant neoplasm of bone: Secondary | ICD-10-CM

## 2016-08-16 DIAGNOSIS — D649 Anemia, unspecified: Secondary | ICD-10-CM

## 2016-08-16 DIAGNOSIS — R630 Anorexia: Secondary | ICD-10-CM

## 2016-08-16 DIAGNOSIS — R112 Nausea with vomiting, unspecified: Secondary | ICD-10-CM

## 2016-08-16 DIAGNOSIS — C787 Secondary malignant neoplasm of liver and intrahepatic bile duct: Secondary | ICD-10-CM | POA: Diagnosis not present

## 2016-08-16 DIAGNOSIS — C3492 Malignant neoplasm of unspecified part of left bronchus or lung: Secondary | ICD-10-CM

## 2016-08-16 DIAGNOSIS — R634 Abnormal weight loss: Secondary | ICD-10-CM

## 2016-08-16 DIAGNOSIS — E876 Hypokalemia: Secondary | ICD-10-CM

## 2016-08-16 DIAGNOSIS — F17200 Nicotine dependence, unspecified, uncomplicated: Secondary | ICD-10-CM

## 2016-08-16 DIAGNOSIS — Z79899 Other long term (current) drug therapy: Secondary | ICD-10-CM

## 2016-08-16 DIAGNOSIS — Z86718 Personal history of other venous thrombosis and embolism: Secondary | ICD-10-CM

## 2016-08-16 DIAGNOSIS — R110 Nausea: Secondary | ICD-10-CM

## 2016-08-16 DIAGNOSIS — C3432 Malignant neoplasm of lower lobe, left bronchus or lung: Secondary | ICD-10-CM | POA: Diagnosis not present

## 2016-08-16 LAB — CBC WITH DIFFERENTIAL/PLATELET
Basophils Absolute: 0.1 10*3/uL (ref 0–0.1)
Basophils Relative: 1 %
Eosinophils Absolute: 0.1 10*3/uL (ref 0–0.7)
Eosinophils Relative: 0 %
HCT: 23.2 % — ABNORMAL LOW (ref 35.0–47.0)
Hemoglobin: 7.7 g/dL — ABNORMAL LOW (ref 12.0–16.0)
Lymphocytes Relative: 5 %
Lymphs Abs: 0.9 10*3/uL — ABNORMAL LOW (ref 1.0–3.6)
MCH: 32.6 pg (ref 26.0–34.0)
MCHC: 33.4 g/dL (ref 32.0–36.0)
MCV: 97.4 fL (ref 80.0–100.0)
Monocytes Absolute: 0.8 10*3/uL (ref 0.2–0.9)
Monocytes Relative: 4 %
Neutro Abs: 16.3 10*3/uL — ABNORMAL HIGH (ref 1.4–6.5)
Neutrophils Relative %: 90 %
Platelets: 278 10*3/uL (ref 150–440)
RBC: 2.38 MIL/uL — ABNORMAL LOW (ref 3.80–5.20)
RDW: 17.2 % — ABNORMAL HIGH (ref 11.5–14.5)
WBC: 18.1 10*3/uL — ABNORMAL HIGH (ref 3.6–11.0)

## 2016-08-16 LAB — COMPREHENSIVE METABOLIC PANEL
ALT: 18 U/L (ref 14–54)
AST: 27 U/L (ref 15–41)
Albumin: 2.4 g/dL — ABNORMAL LOW (ref 3.5–5.0)
Alkaline Phosphatase: 98 U/L (ref 38–126)
Anion gap: 9 (ref 5–15)
BUN: 6 mg/dL (ref 6–20)
CO2: 21 mmol/L — ABNORMAL LOW (ref 22–32)
Calcium: 7.9 mg/dL — ABNORMAL LOW (ref 8.9–10.3)
Chloride: 103 mmol/L (ref 101–111)
Creatinine, Ser: 0.48 mg/dL (ref 0.44–1.00)
GFR calc Af Amer: >60 mL/min (ref >60)
GFR calc non Af Amer: >60 mL/min (ref >60)
Glucose, Bld: 236 mg/dL — ABNORMAL HIGH (ref 65–99)
Potassium: 3 mmol/L — ABNORMAL LOW (ref 3.5–5.1)
Sodium: 133 mmol/L — ABNORMAL LOW (ref 135–145)
Total Bilirubin: 0.6 mg/dL (ref 0.3–1.2)
Total Protein: 5.9 g/dL — ABNORMAL LOW (ref 6.5–8.1)

## 2016-08-16 LAB — FOLATE: Folate: 2.3 ng/mL — ABNORMAL LOW (ref >5.9)

## 2016-08-16 LAB — IRON AND TIBC: Iron: 23 ug/dL — ABNORMAL LOW (ref 28–170)

## 2016-08-16 LAB — TSH: TSH: 4.904 u[IU]/mL — ABNORMAL HIGH (ref 0.350–4.500)

## 2016-08-16 LAB — VITAMIN B12: Vitamin B-12: 1570 pg/mL — ABNORMAL HIGH (ref 180–914)

## 2016-08-16 LAB — T4, FREE: Free T4: 1.28 ng/dL — ABNORMAL HIGH (ref 0.61–1.12)

## 2016-08-16 LAB — MAGNESIUM: Magnesium: 2 mg/dL (ref 1.7–2.4)

## 2016-08-16 LAB — FERRITIN: Ferritin: 471 ng/mL — ABNORMAL HIGH (ref 11–307)

## 2016-08-16 MED ORDER — SODIUM CHLORIDE 0.9% FLUSH
10.0000 mL | Freq: Once | INTRAVENOUS | Status: AC
  Administered 2016-08-16: 10 mL via INTRAVENOUS
  Filled 2016-08-16: qty 10

## 2016-08-16 MED ORDER — SODIUM CHLORIDE 0.9 % IV SOLN
Freq: Once | INTRAVENOUS | Status: AC
  Administered 2016-08-16: 10:00:00 via INTRAVENOUS
  Filled 2016-08-16: qty 1000

## 2016-08-16 MED ORDER — SODIUM CHLORIDE 0.9% FLUSH
10.0000 mL | Freq: Once | INTRAVENOUS | Status: DC
  Filled 2016-08-16: qty 10

## 2016-08-16 MED ORDER — HEPARIN SOD (PORK) LOCK FLUSH 100 UNIT/ML IV SOLN
500.0000 [IU] | Freq: Once | INTRAVENOUS | Status: DC | PRN
  Filled 2016-08-16: qty 5

## 2016-08-16 MED ORDER — SODIUM CHLORIDE 0.9% FLUSH
10.0000 mL | INTRAVENOUS | Status: DC | PRN
  Filled 2016-08-16: qty 10

## 2016-08-16 MED ORDER — SODIUM CHLORIDE 0.9 % IV SOLN
3.0000 mg/kg | Freq: Once | INTRAVENOUS | Status: AC
  Administered 2016-08-16: 140 mg via INTRAVENOUS
  Filled 2016-08-16: qty 14

## 2016-08-16 MED ORDER — HEPARIN SOD (PORK) LOCK FLUSH 100 UNIT/ML IV SOLN
500.0000 [IU] | Freq: Once | INTRAVENOUS | Status: AC
  Administered 2016-08-16: 500 [IU] via INTRAVENOUS

## 2016-08-16 MED ORDER — METOCLOPRAMIDE HCL 5 MG PO TABS: 5.0000 mg | ORAL_TABLET | Freq: Three times a day (TID) | ORAL | 1 refills | Status: DC | PRN

## 2016-08-16 NOTE — Progress Notes (Signed)
Patient is here today for follow up, she mentions she is feeling very tired. She also cant keep any food down after she eats. She states her appetite is not good at all.

## 2016-08-16 NOTE — Progress Notes (Addendum)
[name] returns to clinic this morning for second attempt at Cycle 2, week 3 infusion of Nivolumab per the [facility] S1400I research protocol. She has had central research labs and local labs collected today. She continues to have hyponatremia and hypokalemia. Dr. Mike Gip offered to treat her with IV potassium but patient refused IV. The patient stated that she did not take her oral potassium this past Saturday and Sunday. The patient agrees to increase oral potassium tid today at Dr. Kem Parkinson recommendation. She is hyperglycemic and hypocalcemic. White blood cells continue to be elevated and hemoglobin is now 7.7g/dL. Dr. Mike Gip recommended transfusion but the patient refused. Dr. Mike Gip then offered to start her on Procrit injections and the patient was agreeable to this.  The patient has a weight loss of 2 pounds since her last visit on 08/02/2016. The patient also states that she continues to have severe pain in her groin area, right lower abdominal region and stated she feels pressure in her chest especially after she eats or drinks. She gave the pain an 8.5 rating on a 0/10 point pain scale.  Dr. Mike Gip encouraged her to take pain medication; however, the patient reports she takes it at home only. Reports she is unable to work while taking pain medication. Dr. Mike Gip offered to prescribe a low dose Fentanyl patch but patient refused.  Patient reports she can't eat or drink and that everything comes back up including medications. Dr. Mike Gip has prescribed Reglan for her bowels and chest pressure as well as Prednisone 10 mg daily for pain and loss of appetite.  She started palliative XRT on 08/11/2016 to lower back and pelvis. The patient completed the SWOG questionnaire in clinic today. She will return for cycle 3 week 5 Nivolumab on 08/30/2016. She will also return next week on 08/23/2016 for labs and Procrit injection. Patient was reminded of future appointments and was noted to be in infusion  eating a candy bar. See AE's below.  Christina Mo, RN, BSN 08/16/2016 11:13 AM   Study/Protocol: SWOG S1400I Cycle: C2 week 3  Event Grade Onset Date Resolved Date Drug Name Attribution Treatment Comments  Hyponatremia 1 07/29/16   unrelated  IV fluids  Hypokalemia 2 07/29/16   unrelated  Oral potassium, pt. Refused IV potassium  Weight loss 2 03/29/16   unrelated  Megace Rx 20 lb weight loss in <5 mos  Elevated WBC No grade 07/29/16   unrelated    Back Pain 3 Baseline   unrelated  Palliative XRT of lower spine and pelvis  Hypocalcemia 1 07/09/16   unrelated  Taking Xgeva  Hyperglycemia 2 07/29/16   unrelated  Not fasting  Hypoalbuminemia 2 07/29/16   unrelated    Nausea 2 08/02/2016  Ipilimumab possibly    Vomiting 2 08/02/2016  Ipilimumab possibly  Pt. Reports 3-4 episodes of small amount vomit every other day   Anemia 3 08/02/2016  Ipilimumab related  To begin Procrit injections 08/23/2016   Christina Mo, RN, BSN

## 2016-08-16 NOTE — Telephone Encounter (Signed)
Called pt to let her know that folate level was low and she needed to get folic acid 1 mg OTC and take one daily.  She in between left me a message about what the name of the nausea med the md mentioned this am while she was in clinic.  Dr. Mike Gip actually sent rx to pharmacy.  Then a second phone call to stop [facility] [address]-erase it from her list but she wants to add [facility].  And she would like it added somewhere in her chart that when she is in clinic and needs RX she [facility] but if she is at home or coming from work she wants [facility] [address].  In my message to her I let her know that there is not a notation that will show in the chart that we can document to make that happen to her.  We are happy to send it to which ever pharmacy she would like but because she chooses 2 pharmacies she will need to tell us which one she wants it to go to.  She can call me back anytime to discuss all info above

## 2016-08-16 NOTE — Telephone Encounter (Signed)
-----   Message from Lequita Asal, MD sent at 08/16/2016  1:17 PM EDT ----- Regarding: Please call patient  Folate low.  Begin folic acid 1 mg a day.  M  ----- Message ----- From: Interface, Lab In [place] Sent: 08/16/2016   8:46 AM To: Lequita Asal, MD

## 2016-08-17 ENCOUNTER — Telehealth: Payer: Self-pay

## 2016-08-17 ENCOUNTER — Ambulatory Visit
Admission: RE | Admit: 2016-08-17 | Discharge: 2016-08-17 | Disposition: A | Discharge status: Home / Self Care | Payer: BLUE CROSS/BLUE SHIELD | Source: Ambulatory Visit | Attending: Radiation Oncology | Admitting: Radiation Oncology

## 2016-08-17 DIAGNOSIS — C7951 Secondary malignant neoplasm of bone: Secondary | ICD-10-CM | POA: Diagnosis not present

## 2016-08-17 NOTE — Telephone Encounter (Signed)
Telephone call to Christina Mckee to request submission to SWOG S1400I  (baseline)  of CT of chest and abdomen (07/05/2016) and MRI of brain (07/24/2016).  Mirian Mo, RN, BSN 08/17/2016 1:31 PM

## 2016-08-18 ENCOUNTER — Ambulatory Visit
Admission: RE | Admit: 2016-08-18 | Discharge: 2016-08-18 | Disposition: A | Discharge status: Home / Self Care | Payer: BLUE CROSS/BLUE SHIELD | Source: Ambulatory Visit | Attending: Radiation Oncology | Admitting: Radiation Oncology

## 2016-08-18 ENCOUNTER — Telehealth: Payer: Self-pay | Admitting: Hematology and Oncology

## 2016-08-18 DIAGNOSIS — C7951 Secondary malignant neoplasm of bone: Secondary | ICD-10-CM | POA: Diagnosis not present

## 2016-08-18 NOTE — Telephone Encounter (Signed)
I called pt back and got her voicemail and left her the message that dr Mike Gip [facility] [address] I left her a message yest also because she called and asked the same question and also a message about [facility] [address] and adding [facility] [address]  I left her my number for [place] and that I would be here til 5 pm today

## 2016-08-18 NOTE — Telephone Encounter (Signed)
Pt lvm - said she has question for Dr. Mike Gip regarding something that could help her keep food down. Please call: [phone]. Thanks.

## 2016-08-18 NOTE — Telephone Encounter (Signed)
  Please call.  Has she tried the Reglan?  Has she tried Compazine or Phenergan?  She knows that she needs to see the eye doctor to get a scopolamine patch.  M

## 2016-08-18 NOTE — Telephone Encounter (Signed)
Pt called back and told me that the tessalon pearls did not work in the beginning and not working now. I told her we could try cheratussin but she does not want to due to the narcotic in it. She asked about reglan and it helped some but she still had upset stomach at night.  I checked with Mike Gip and she states that she can alternate the reglan with zofran or compazine so that she is taking one drug followed by other drug every 4 hours.

## 2016-08-19 ENCOUNTER — Ambulatory Visit
Admission: RE | Admit: 2016-08-19 | Discharge: 2016-08-19 | Disposition: A | Discharge status: Home / Self Care | Payer: BLUE CROSS/BLUE SHIELD | Source: Ambulatory Visit | Attending: Radiation Oncology | Admitting: Radiation Oncology

## 2016-08-19 DIAGNOSIS — C7951 Secondary malignant neoplasm of bone: Secondary | ICD-10-CM | POA: Diagnosis not present

## 2016-08-20 ENCOUNTER — Ambulatory Visit
Admission: RE | Admit: 2016-08-20 | Discharge: 2016-08-20 | Disposition: A | Discharge status: Home / Self Care | Payer: BLUE CROSS/BLUE SHIELD | Source: Ambulatory Visit | Attending: Radiation Oncology | Admitting: Radiation Oncology

## 2016-08-20 ENCOUNTER — Other Ambulatory Visit: Payer: Self-pay

## 2016-08-20 DIAGNOSIS — C3492 Malignant neoplasm of unspecified part of left bronchus or lung: Secondary | ICD-10-CM

## 2016-08-20 DIAGNOSIS — C7951 Secondary malignant neoplasm of bone: Secondary | ICD-10-CM | POA: Diagnosis not present

## 2016-08-23 ENCOUNTER — Inpatient Hospital Stay: Payer: BLUE CROSS/BLUE SHIELD

## 2016-08-23 ENCOUNTER — Telehealth: Payer: Self-pay | Admitting: *Deleted

## 2016-08-23 ENCOUNTER — Inpatient Hospital Stay: Payer: BLUE CROSS/BLUE SHIELD | Attending: Radiation Oncology

## 2016-08-23 ENCOUNTER — Ambulatory Visit
Admission: RE | Admit: 2016-08-23 | Discharge: 2016-08-23 | Disposition: A | Discharge status: Home / Self Care | Payer: BLUE CROSS/BLUE SHIELD | Source: Ambulatory Visit | Attending: Radiation Oncology | Admitting: Radiation Oncology

## 2016-08-23 DIAGNOSIS — R112 Nausea with vomiting, unspecified: Secondary | ICD-10-CM | POA: Diagnosis not present

## 2016-08-23 DIAGNOSIS — R05 Cough: Secondary | ICD-10-CM | POA: Insufficient documentation

## 2016-08-23 DIAGNOSIS — C3492 Malignant neoplasm of unspecified part of left bronchus or lung: Secondary | ICD-10-CM

## 2016-08-23 DIAGNOSIS — C3432 Malignant neoplasm of lower lobe, left bronchus or lung: Secondary | ICD-10-CM | POA: Diagnosis not present

## 2016-08-23 DIAGNOSIS — D6489 Other specified anemias: Secondary | ICD-10-CM | POA: Diagnosis not present

## 2016-08-23 DIAGNOSIS — D72829 Elevated white blood cell count, unspecified: Secondary | ICD-10-CM | POA: Insufficient documentation

## 2016-08-23 DIAGNOSIS — R0602 Shortness of breath: Secondary | ICD-10-CM | POA: Insufficient documentation

## 2016-08-23 DIAGNOSIS — E860 Dehydration: Secondary | ICD-10-CM | POA: Diagnosis not present

## 2016-08-23 DIAGNOSIS — Z5111 Encounter for antineoplastic chemotherapy: Secondary | ICD-10-CM | POA: Insufficient documentation

## 2016-08-23 DIAGNOSIS — Z006 Encounter for examination for normal comparison and control in clinical research program: Secondary | ICD-10-CM | POA: Insufficient documentation

## 2016-08-23 DIAGNOSIS — R531 Weakness: Secondary | ICD-10-CM | POA: Insufficient documentation

## 2016-08-23 DIAGNOSIS — G893 Neoplasm related pain (acute) (chronic): Secondary | ICD-10-CM | POA: Diagnosis not present

## 2016-08-23 DIAGNOSIS — E876 Hypokalemia: Secondary | ICD-10-CM | POA: Insufficient documentation

## 2016-08-23 DIAGNOSIS — E46 Unspecified protein-calorie malnutrition: Secondary | ICD-10-CM | POA: Diagnosis not present

## 2016-08-23 DIAGNOSIS — R6883 Chills (without fever): Secondary | ICD-10-CM | POA: Insufficient documentation

## 2016-08-23 DIAGNOSIS — F5089 Other specified eating disorder: Secondary | ICD-10-CM | POA: Diagnosis not present

## 2016-08-23 DIAGNOSIS — C7951 Secondary malignant neoplasm of bone: Secondary | ICD-10-CM | POA: Diagnosis not present

## 2016-08-23 DIAGNOSIS — Z79899 Other long term (current) drug therapy: Secondary | ICD-10-CM | POA: Insufficient documentation

## 2016-08-23 DIAGNOSIS — Z86718 Personal history of other venous thrombosis and embolism: Secondary | ICD-10-CM | POA: Diagnosis not present

## 2016-08-23 DIAGNOSIS — F17210 Nicotine dependence, cigarettes, uncomplicated: Secondary | ICD-10-CM | POA: Diagnosis not present

## 2016-08-23 DIAGNOSIS — R000 Tachycardia, unspecified: Secondary | ICD-10-CM | POA: Diagnosis not present

## 2016-08-23 DIAGNOSIS — R5381 Other malaise: Secondary | ICD-10-CM | POA: Insufficient documentation

## 2016-08-23 DIAGNOSIS — C787 Secondary malignant neoplasm of liver and intrahepatic bile duct: Secondary | ICD-10-CM

## 2016-08-23 LAB — CBC WITH DIFFERENTIAL/PLATELET
Basophils Absolute: 0.1 10*3/uL (ref 0–0.1)
Basophils Relative: 0 %
Eosinophils Absolute: 0.1 10*3/uL (ref 0–0.7)
Eosinophils Relative: 1 %
HCT: 25.3 % — ABNORMAL LOW (ref 35.0–47.0)
Hemoglobin: 8.5 g/dL — ABNORMAL LOW (ref 12.0–16.0)
Lymphocytes Relative: 3 %
Lymphs Abs: 0.5 10*3/uL — ABNORMAL LOW (ref 1.0–3.6)
MCH: 32.6 pg (ref 26.0–34.0)
MCHC: 33.5 g/dL (ref 32.0–36.0)
MCV: 97.3 fL (ref 80.0–100.0)
Monocytes Absolute: 1.5 10*3/uL — ABNORMAL HIGH (ref 0.2–0.9)
Monocytes Relative: 7 %
Neutro Abs: 19.2 10*3/uL — ABNORMAL HIGH (ref 1.4–6.5)
Neutrophils Relative %: 89 %
Platelets: 249 10*3/uL (ref 150–440)
RBC: 2.6 MIL/uL — ABNORMAL LOW (ref 3.80–5.20)
RDW: 17.6 % — ABNORMAL HIGH (ref 11.5–14.5)
WBC: 21.4 10*3/uL — ABNORMAL HIGH (ref 3.6–11.0)

## 2016-08-23 LAB — BASIC METABOLIC PANEL
Anion gap: 12 (ref 5–15)
BUN: 7 mg/dL (ref 6–20)
CO2: 19 mmol/L — ABNORMAL LOW (ref 22–32)
Calcium: 8 mg/dL — ABNORMAL LOW (ref 8.9–10.3)
Chloride: 100 mmol/L — ABNORMAL LOW (ref 101–111)
Creatinine, Ser: 0.77 mg/dL (ref 0.44–1.00)
GFR calc Af Amer: >60 mL/min (ref >60)
GFR calc non Af Amer: >60 mL/min (ref >60)
Glucose, Bld: 170 mg/dL — ABNORMAL HIGH (ref 65–99)
Potassium: 3 mmol/L — ABNORMAL LOW (ref 3.5–5.1)
Sodium: 131 mmol/L — ABNORMAL LOW (ref 135–145)

## 2016-08-23 NOTE — Telephone Encounter (Signed)
Erroneous entry

## 2016-08-23 NOTE — Telephone Encounter (Signed)
-----   Message from Lequita Asal, MD sent at 08/23/2016 11:32 AM EDT ----- Regarding: Is patient taking her potassium?  Potassium remains low.  I know she does not want IV potassium.  Is she taking her oral potassium?  M  ----- Message ----- From: Interface, Lab In [place] Sent: 08/23/2016   8:23 AM To: Lequita Asal, MD

## 2016-08-23 NOTE — Progress Notes (Signed)
Dr. Mike Gip is pleased that hemoglobin has improved.

## 2016-08-23 NOTE — Progress Notes (Signed)
Patient did not come upstairs for Procrit injection today.

## 2016-08-23 NOTE — Telephone Encounter (Signed)
Called and got voicemail and left message that her potassium still low at 3.0, want to know if pt has missed any potassium doses.  She would like her to have potassium IV is she would agree.  Asked her to call me on my ascom phone number.

## 2016-08-24 ENCOUNTER — Ambulatory Visit
Admission: RE | Admit: 2016-08-24 | Discharge: 2016-08-24 | Disposition: A | Discharge status: Home / Self Care | Payer: BLUE CROSS/BLUE SHIELD | Source: Ambulatory Visit | Attending: Radiation Oncology | Admitting: Radiation Oncology

## 2016-08-24 DIAGNOSIS — C7951 Secondary malignant neoplasm of bone: Secondary | ICD-10-CM | POA: Diagnosis not present

## 2016-08-25 ENCOUNTER — Ambulatory Visit
Admission: RE | Admit: 2016-08-25 | Discharge: 2016-08-25 | Disposition: A | Discharge status: Home / Self Care | Payer: BLUE CROSS/BLUE SHIELD | Source: Ambulatory Visit | Attending: Radiation Oncology | Admitting: Radiation Oncology

## 2016-08-25 ENCOUNTER — Telehealth: Payer: Self-pay | Admitting: *Deleted

## 2016-08-25 ENCOUNTER — Other Ambulatory Visit: Payer: Self-pay | Admitting: *Deleted

## 2016-08-25 DIAGNOSIS — R7989 Other specified abnormal findings of blood chemistry: Secondary | ICD-10-CM

## 2016-08-25 DIAGNOSIS — C7951 Secondary malignant neoplasm of bone: Secondary | ICD-10-CM | POA: Diagnosis not present

## 2016-08-25 DIAGNOSIS — C787 Secondary malignant neoplasm of liver and intrahepatic bile duct: Secondary | ICD-10-CM

## 2016-08-25 DIAGNOSIS — C3492 Malignant neoplasm of unspecified part of left bronchus or lung: Secondary | ICD-10-CM

## 2016-08-25 NOTE — Telephone Encounter (Signed)
Dr. Mike Gip did call Dr. Gabriel Carina and she thought pt needs to be seen and I faxed over info to get her an appt..  I got a call about an appointment and it will be  10/26 at 9:45.I have called pt to make sure she is acceptable to the appt to go see Dr. Gabriel Carina.  Patient called me back before I was able to finish this note.  She wanted to see if she had the TSH checked before and I told her that she had the tsh drawn before and it was normal.  She said that if it was normal and now not she would like it retested on Monday when she comes and if it is still elevated then she would like the appt to be the same day as her next treatment.  I told her I could try to get it that way but we can discuss on Monday and I would check with corcoran about adding Tsh on for Monday.  Patient agreeable to the plan and I will contact pt if dr Mike Gip does not agree to the additional lab.

## 2016-08-25 NOTE — Telephone Encounter (Signed)
-----   Message from Lequita Asal, MD sent at 08/18/2016  4:52 PM EDT ----- Regarding: Let's call endocrine in morning  Needs referral  Thyroid  ----- Message ----- From: Interface, Lab In [place] Sent: 08/16/2016   3:11 PM To: Lequita Asal, MD

## 2016-08-26 ENCOUNTER — Ambulatory Visit
Admission: RE | Admit: 2016-08-26 | Discharge: 2016-08-26 | Disposition: A | Discharge status: Home / Self Care | Payer: BLUE CROSS/BLUE SHIELD | Source: Ambulatory Visit | Attending: Radiation Oncology | Admitting: Radiation Oncology

## 2016-08-26 DIAGNOSIS — C7951 Secondary malignant neoplasm of bone: Secondary | ICD-10-CM | POA: Diagnosis not present

## 2016-08-27 ENCOUNTER — Other Ambulatory Visit: Payer: Self-pay | Admitting: Hematology and Oncology

## 2016-08-27 ENCOUNTER — Other Ambulatory Visit: Payer: Self-pay | Admitting: *Deleted

## 2016-08-27 ENCOUNTER — Ambulatory Visit
Admission: RE | Admit: 2016-08-27 | Discharge: 2016-08-27 | Disposition: A | Discharge status: Home / Self Care | Payer: BLUE CROSS/BLUE SHIELD | Source: Ambulatory Visit | Attending: Radiation Oncology | Admitting: Radiation Oncology

## 2016-08-27 DIAGNOSIS — C7951 Secondary malignant neoplasm of bone: Secondary | ICD-10-CM | POA: Diagnosis not present

## 2016-08-27 DIAGNOSIS — C3490 Malignant neoplasm of unspecified part of unspecified bronchus or lung: Secondary | ICD-10-CM

## 2016-08-29 NOTE — Progress Notes (Signed)
[facility] day:  08/30/2016   Chief Complaint: [facility] is a 55 y.o. female with metastatic squamous cell lung cancer who is seen for assessment prior to cycle #3 nivolumab + ipilimumab on SWOG S1400.   HPI: The patient was last seen in the medical oncology [facility] on 08/16/2016.  At that time, pain was controlled with Tramadol or hydrocodone/Tylenol, but she did not use it much so she could work.  She used ibuprofen prn.  She had nausea poorly managed by ondansetron and Compazine.  She had weight loss and progressive anemia likely due to poor oral intake.  She had hypokalemia (potassium 3.0) after missing 2 days of oral potassium.  She declined IV potassium and was to increase her oral potassium.  We discussed low dose Reglan to manage her nausea.  A prescription was provided.  She declined a 12 mcg/hr Fentanyl patch.  She underwent an anemia work-up.  Ferritin was 471.  Iron studies included an iron of 23.  B12 was 1570.  Folate was 2.3 (low).  TSH was 4.904 (.35 - 4.50) with a free T4 of 1.28 (0.61 - 1.12).  She was contacted regarding initiation of folic acid 1 mg a day.  She had follow-up labs on 08/23/2016.  Potassium was 3.0.  CBC revealed a hematocrit of 25.3, hemoglobin 8.5, MCV 97.3, platelets 249,000, WBC 21,400 with an ANC 19,200.  She declined IV potassium and was to increase her oral potassium.  During the interim, she states that she "can't keep anything down".  She states that the Reglan is "not good".  She denies any side effects with the Reglan..   It works "some for [employer]  She is able to drink liquids, but can't eat solids.  She states that she gags all of the time on "thick frothy phlegm".  She states "Megace helps".  She "can't do Instant Breakfast".  Vanilla Ensure and Boost make her gag.  She denies any change in cough.  She has chronic shortness of breath.  She denies any urinary tract symptoms.  She denies any diarrhea.  She  denies any fevers but notes chills and heat waves.  She is light headed, but not dizzy.  She has pain in her hips and in her chest/ribs on the left side.  Pain sometimes shoots down her right leg.  She limits her pain medication use so that she can work.  She states that "radiation is going".   Past Medical History:  Diagnosis Date  . DVT (deep venous thrombosis) ([place])    in 2015  . Menopause    prior to 2014  . Squamous cell lung cancer [facility])     Past Surgical History:  Procedure Laterality Date  . BREAST CYST EXCISION     left breast removal  . CESAREAN SECTION    . IVC FILTER PLACEMENT ([facility] HX)    . PERIPHERAL VASCULAR CATHETERIZATION N/A 04/28/2016   Procedure: Glori Luis Cath Insertion;  Surgeon: Algernon Huxley, MD;  Location: [facility];  Service: Cardiovascular;  Laterality: N/A;    Family History  Problem Relation Age of Onset  . Breast cancer Mother   . Breast cancer Maternal Aunt   . Stomach cancer Paternal Uncle   . Leukemia Paternal Uncle     Social History:  reports that she has been smoking Cigarettes.  She has been smoking about 0.25 packs per day. She has never used smokeless tobacco. She reports that  she drinks alcohol. She reports that she does not use drugs.  She has smoked 1/2-1 pack a day since age 15. She has a 30-40 pack year smoking history.  She drinks 4 beers a day. She lives in [place] alone with her cats and dogs. She has a dog named Production assistant, radio (lab and shepherd mix).  She previously worked at [employer], but notes all positions were recently eliminated. She continues to work.  Thursdays are better treatment days for her.  Her daughter got married on 08/01/2016.  She has to work until 09/22/2016.  Allergies:  Allergies  Allergen Reactions  . Penicillins Hives and Other (See Comments)    Has patient had a PCN reaction causing immediate rash, facial/tongue/throat swelling, SOB or lightheadedness with hypotension: No Has patient had a PCN reaction causing  severe rash involving mucus membranes or skin necrosis: No Has patient had a PCN reaction that required hospitalization No Has patient had a PCN reaction occurring within the last 10 years: No If all of the above answers are "NO", then may proceed with Cephalosporin use.    Current Medications: Current Outpatient Prescriptions  Medication Sig Dispense Refill  . GuaiFENesin (MUCINEX PO) Take by mouth.    . guaifenesin (ROBITUSSIN) 100 MG/5ML syrup Take 200 mg by mouth 3 (three) times daily as needed for cough or congestion.    Marland Kitchen ibuprofen (ADVIL,MOTRIN) 200 MG tablet Take by mouth.    Marland Kitchen KLOR-CON 10 10 MEQ tablet TAKE 3 TABS BY MOUTH IN THE MORNING AND 2 TABS BY MOUTH IN THE EVENING 150 tablet 0  . lidocaine-prilocaine (EMLA) cream Apply 1 application topically as needed. 30 g 6  . megestrol (MEGACE) 400 MG/10ML suspension Take 5 mLs (200 mg total) by mouth daily. 150 mL 1  . metoCLOPramide (REGLAN) 5 MG tablet Take 1 tablet (5 mg total) by mouth every 8 (eight) hours as needed for nausea. 30 tablet 1  . nicotine (NICODERM CQ - DOSED IN MG/24 HOURS) 21 mg/24hr patch Place 1 patch (21 mg total) onto the skin daily. 28 patch 0  . ondansetron (ZOFRAN) 8 MG tablet Take 1 tablet (8 mg total) by mouth every 8 (eight) hours as needed for nausea or vomiting. 60 tablet 1  . prochlorperazine (COMPAZINE) 10 MG tablet Take 1 tablet (10 mg total) by mouth every 6 (six) hours as needed for nausea or vomiting. 30 tablet 6  . traMADol (ULTRAM) 50 MG tablet Take 1 tablet (50 mg total) by mouth every 6 (six) hours as needed. 60 tablet 0  . guaiFENesin-codeine (CHERATUSSIN AC) 100-10 MG/5ML syrup Take 5 mLs by mouth 3 (three) times daily as needed for cough. (Patient not taking: Reported on 08/30/2016) 120 mL 0   No current facility-administered medications for this visit.    Facility-Administered Medications Ordered in Other Visits  Medication Dose Route Frequency Provider Last Rate Last Dose  . heparin lock  flush 100 unit/mL  500 Units Intravenous Once Lequita Asal, MD      . sodium chloride flush (NS) 0.9 % injection 10 mL  10 mL Intravenous PRN Lequita Asal, MD   10 mL at 04/29/16 0906  . sodium chloride flush (NS) 0.9 % injection 10 mL  10 mL Intravenous Once Lequita Asal, MD        Review of Systems:  GENERAL: Fatigue. No fevers or sweats. Chills and heat waves.  No new weight. PERFORMANCE STATUS (ECOG): 1 HEENT: Runny nose due to allergies.  No visual changes,  sore throat, mouth sores or tenderness. Declines dental evaluation. Lungs:  Left sided chest discomfort (no change).  Chronic shortness of breath.  No pleuritic chest pain.  Frothy thick phlegm.  No hemoptysis. Cardiac: No chest pain, palpitations, orthopnea, or PND. GI: Poor oral intake (see HPI).  Nausea.  No vomiting, constipation, melena or hematochezia. GU: No urgency, frequency, dysuria, or hematuria. Musculoskeletal: Left sided musculoskeletal pain.  Lumbar back pain, improved with Tramadol (does not take if works). No muscle tenderness. Extremities: Chronic mild left lower extremity edema. Wears Ted hose.  No pain. Skin: No rashes or skin changes. Neuro: Short term memory issues.  Lightheaded, not dizzy.  No headache, numbness or weakness, balance or coordination issues. Endocrine: No diabetes, thyroid issues, hot flashes or night sweats. Psych: No mood changes, depression or anxiety. Pain: Left lower rib rage discomfort.  Hip pain which at times shoots down right leg. Review of systems: All other systems reviewed and found to be negative  Physical Exam: Blood pressure 106/67, pulse (!) 131, temperature 98.1 F (36.7 C), temperature source Tympanic, last menstrual period 04/14/2004. GENERAL: Thin woman sitting comfortably in the exam room in no acute distress.  MENTAL STATUS: Alert and oriented to person, place and time. HEAD: Thin short gray hair.  Normocephalic, atraumatic, face  symmetric, no Cushingoid features. EYES: Blue eyes. Pupils equal round and reactive to light and accomodation. No conjunctivitis or scleral icterus. ENT: Oropharynx clear without lesion. Tongue normal. Mucous membranes moist.  RESPIRATORY: Clear to auscultation without rales, wheezes or rhonchi. CARDIOVASCULAR: Tachycardia.  Regular rate and rhythm without murmur, rub or gallop. ABDOMEN: Soft, non-tender, with active bowel sounds, and no hepatosplenomegaly. No masses. SKIN: Spider angiomas on chest.  No rashes, ulcers or lesions. EXTREMITIES:  No edema, skin discoloration or tenderness. No palpable cords. LYMPH NODES: No palpable cervical, supraclavicular, axillary or inguinal adenopathy  NEUROLOGICAL: Appropriate. PSYCH:  Appropriate.   Infusion on 08/30/2016  Component Date Value Ref Range Status  . Sodium 08/30/2016 132* 135 - 145 mmol/L Final  . Potassium 08/30/2016 4.6  3.5 - 5.1 mmol/L Final  . Chloride 08/30/2016 101  101 - 111 mmol/L Final  . CO2 08/30/2016 16* 22 - 32 mmol/L Final  . Glucose, Bld 08/30/2016 222* 65 - 99 mg/dL Final  . BUN 08/30/2016 8  6 - 20 mg/dL Final  . Creatinine, Ser 08/30/2016 0.94  0.44 - 1.00 mg/dL Final  . Calcium 08/30/2016 7.9* 8.9 - 10.3 mg/dL Final  . Total Protein 08/30/2016 7.0  6.5 - 8.1 g/dL Final  . Albumin 08/30/2016 2.7* 3.5 - 5.0 g/dL Final  . AST 08/30/2016 39  15 - 41 U/L Final  . ALT 08/30/2016 23  14 - 54 U/L Final  . Alkaline Phosphatase 08/30/2016 123  38 - 126 U/L Final  . Total Bilirubin 08/30/2016 0.7  0.3 - 1.2 mg/dL Final  . GFR calc non Af Amer 08/30/2016 >60  >60 mL/min Final  . GFR calc Af Amer 08/30/2016 >60  >60 mL/min Final   Comment: (NOTE) The eGFR has been calculated using the CKD EPI equation. This calculation has not been validated in all clinical situations. eGFR's persistently <60 mL/min signify possible Chronic Kidney Disease.   . Anion gap 08/30/2016 15  5 - 15 Final  . WBC 08/30/2016 35.2* 3.6  - 11.0 K/uL Final  . RBC 08/30/2016 2.64* 3.80 - 5.20 MIL/uL Final  . Hemoglobin 08/30/2016 8.4* 12.0 - 16.0 g/dL Final  . HCT 08/30/2016 26.2* 35.0 - 47.0 %  Final  . MCV 08/30/2016 99.4  80.0 - 100.0 fL Final  . MCH 08/30/2016 31.7  26.0 - 34.0 pg Final  . MCHC 08/30/2016 31.9* 32.0 - 36.0 g/dL Final  . RDW 08/30/2016 18.0* 11.5 - 14.5 % Final  . Platelets 08/30/2016 161  150 - 440 K/uL Final  . Neutrophils Relative % 08/30/2016 96  % Final  . Neutro Abs 08/30/2016 33.8* 1.4 - 6.5 K/uL Final  . Lymphocytes Relative 08/30/2016 0  % Final  . Lymphs Abs 08/30/2016 0.2* 1.0 - 3.6 K/uL Final  . Monocytes Relative 08/30/2016 3  % Final  . Monocytes Absolute 08/30/2016 1.1* 0.2 - 0.9 K/uL Final  . Eosinophils Relative 08/30/2016 0  % Final  . Eosinophils Absolute 08/30/2016 0.0  0 - 0.7 K/uL Final  . Basophils Relative 08/30/2016 1  % Final  . Basophils Absolute 08/30/2016 0.2* 0 - 0.1 K/uL Final  . Magnesium 08/30/2016 1.8  1.7 - 2.4 mg/dL Final    Assessment:  Christina Mckee is a 55 y.o. female with metastatic squamous cell lung cancer. Molecular studies were negative studies for EGFR, ALK, and ROS1. PD-L1 was 1%.  Chest CT angiogram on 03/05/2016 revealed 8.7 x 5.7 x 3.3 cm mass in the medial aspect of the superior segment of the left lower lobe extending into the medial aspect of the left upper lobe and into the left hilum. There was vascular and endobronchial encasement and narrowing. There were multiple small left lung nodules. Abdomen and pelvic CT scan revealed diffuse hepatic steatosis and no evidence of metastatic disease in the abdomen or pelvis.  CT guided left lower lobe biopsy on 03/29/2016 revealed moderately differentiated squamous cell carcinoma. CEA and LDH were normal on 04/05/2016.  PET scan on 04/14/2016 revealed hypermetabolic left lower lobe mass with hypermetabolic left hilar adenopathy and left hepatic lobe lesion. Findings were consistent with stage IV primary  bronchogenic carcinoma. There were mildly hypermetabolic subcarinal lymph node versus mild esophageal uptake. There was hepatic steatosis. Head CT on 04/15/2016 was normal.  CEA (2.6) and LDH (128) were normal on 04/05/2016.  She has a history of an extensive left lower extremity thromboses in 12/2012. Because of the extensive clot burden, she underwent TPA with angioplasty on 12/28/2012. An IVC filter was placed on 12/29/2012 and still remains. Hypercoagulable workup was negative for Factor V Leiden, prothrombin gene mutation, lupus anticoagulant, and antithrombin III. She was on Xarelto for 6 months.  She declined low dose Eliquis secondary to cost.  She received 3 cycles of carboplatin and Abraxane (04/30/2016 - 06/17/2016).  Chest, abdomen, and pelvic CT scan on 07/05/2016 revealed a mixed response.  There was response to therapy of dominant left lower lobe lung mass (5.7 x 3.3 cm to 5.5 x 2.3 cm).  Liver metastasis was likely progressive (3.4 x 2 cm).  There was development of multiple bilateral renal lesions.  There was new anterior L3 lucent and sclerotic lesion.  Head MRI on 07/24/2016 revealed no evidence of metastatic disease.  Lumbar spine MRI on 07/31/2016 revealed multiple vertebral body bone lesions most consistent with metastatic disease.  There was no evidence of epidural extension.  There was a small left paracentral disc protrusion at T12-L1. There was no evidence of nerve root impingement.  She began monthly Xgeva on 07/15/2016.  She has received 2 cycles of nivolumab + ipilumumab on SWOG S1400 (08/02/2016 - 08/16/2016).  She began palliative radiation on 08/11/2016.  Symptomatically, she is lightheaded and tachycardic.  She has  frothy thick phlegm.  She is eating poorly.  Nausea is not well controlled.  WBC is 35,200 with chills worrisome for infection.  Pain is controlled when she takes Tramadol or hydrocodone/Tylenol, but is not used so she can work.  She uses ibuprofen prn.    Plan: 1.  Labs today:  CBC with diff, CMP, TSH, free T4. 2.  Discuss worrisome signs and symptoms of infection.  Discuss elevated WBC (35,200) with chills and heat waves.  Concern for potential pneumonia.  Discuss fever work-up including blood cultures, urine culture, and CXR.  Discuss admission to the hospital given multitude of issues today.  Patient declines.  She is willing to come to [facility] daily, but not be admitted as she has to work. 3.  Discuss concerning tachycardia (initial pulse 163 with repeat of 131).  Check EKG. 4.  Discuss anemia.  Patient taking folic acid.  Oral intake is poor.  Discuss management of nausea.  Discuss taking anti-emetics 30 minutes prior to eating.  Discuss trying anti-emetics daily in [facility] (when she comes in for fluid) to assess drug efficacy.  Patient refuses to see optholmologist for eye check to obtain Rx for scopolamine patch (contraindicated if patient has glaucoma). 5.  Discuss electrolytes.  Patient's potassium is normal today as she has been taking her potassium. 6.  Discuss postponing treatment today.  Discuss code status given concern for multitude of issues and declining admission to the hospital.  She wishes to be full code. 7.  IVF 1 liter NS over 3 hours. 8.  Phenergan 12.5 mg IV. 9.  Blood cultures (port if needed) and lactic acid 10.  UA and culture 11.  CXR (PA and lateral) - cough; line placement 12.  EKG- tachycardia 13.  Rx:  Levaquin 500 mg po q day. 14.  RTC daily for assessment, labs, and fluids 15.  RTC in 1 week for MD assessment, labs (CBC with diff, CMP, Mg), and consideration of cycle #3 nivolumab.   Lequita Asal, MD  08/30/2016, 10:30 AM

## 2016-08-30 ENCOUNTER — Inpatient Hospital Stay (HOSPITAL_BASED_OUTPATIENT_CLINIC_OR_DEPARTMENT_OTHER): Payer: BLUE CROSS/BLUE SHIELD | Admitting: Hematology and Oncology

## 2016-08-30 ENCOUNTER — Encounter: Payer: Self-pay | Admitting: *Deleted

## 2016-08-30 ENCOUNTER — Inpatient Hospital Stay: Payer: BLUE CROSS/BLUE SHIELD

## 2016-08-30 ENCOUNTER — Ambulatory Visit
Admission: RE | Admit: 2016-08-30 | Discharge: 2016-08-30 | Disposition: A | Discharge status: Home / Self Care | Payer: BLUE CROSS/BLUE SHIELD | Source: Ambulatory Visit | Attending: Hematology and Oncology | Admitting: Hematology and Oncology

## 2016-08-30 ENCOUNTER — Telehealth: Payer: Self-pay | Admitting: *Deleted

## 2016-08-30 ENCOUNTER — Ambulatory Visit: Admission: RE | Admit: 2016-08-30 | Payer: BLUE CROSS/BLUE SHIELD | Source: Ambulatory Visit

## 2016-08-30 ENCOUNTER — Encounter: Payer: Self-pay | Admitting: Hematology and Oncology

## 2016-08-30 ENCOUNTER — Other Ambulatory Visit: Payer: Self-pay | Admitting: *Deleted

## 2016-08-30 VITALS — BP 106/67 | HR 131 | Temp 98.1°F

## 2016-08-30 DIAGNOSIS — C3492 Malignant neoplasm of unspecified part of left bronchus or lung: Secondary | ICD-10-CM | POA: Insufficient documentation

## 2016-08-30 DIAGNOSIS — E876 Hypokalemia: Secondary | ICD-10-CM

## 2016-08-30 DIAGNOSIS — C7951 Secondary malignant neoplasm of bone: Secondary | ICD-10-CM

## 2016-08-30 DIAGNOSIS — G893 Neoplasm related pain (acute) (chronic): Secondary | ICD-10-CM

## 2016-08-30 DIAGNOSIS — E46 Unspecified protein-calorie malnutrition: Secondary | ICD-10-CM

## 2016-08-30 DIAGNOSIS — R000 Tachycardia, unspecified: Secondary | ICD-10-CM | POA: Insufficient documentation

## 2016-08-30 DIAGNOSIS — C3432 Malignant neoplasm of lower lobe, left bronchus or lung: Secondary | ICD-10-CM

## 2016-08-30 DIAGNOSIS — C787 Secondary malignant neoplasm of liver and intrahepatic bile duct: Secondary | ICD-10-CM | POA: Insufficient documentation

## 2016-08-30 DIAGNOSIS — Z006 Encounter for examination for normal comparison and control in clinical research program: Secondary | ICD-10-CM

## 2016-08-30 DIAGNOSIS — R05 Cough: Secondary | ICD-10-CM

## 2016-08-30 DIAGNOSIS — R7989 Other specified abnormal findings of blood chemistry: Secondary | ICD-10-CM

## 2016-08-30 DIAGNOSIS — R112 Nausea with vomiting, unspecified: Secondary | ICD-10-CM | POA: Diagnosis not present

## 2016-08-30 DIAGNOSIS — F17210 Nicotine dependence, cigarettes, uncomplicated: Secondary | ICD-10-CM

## 2016-08-30 DIAGNOSIS — D72829 Elevated white blood cell count, unspecified: Secondary | ICD-10-CM

## 2016-08-30 DIAGNOSIS — E860 Dehydration: Secondary | ICD-10-CM

## 2016-08-30 DIAGNOSIS — R0602 Shortness of breath: Secondary | ICD-10-CM

## 2016-08-30 DIAGNOSIS — R6883 Chills (without fever): Secondary | ICD-10-CM

## 2016-08-30 DIAGNOSIS — Z5111 Encounter for antineoplastic chemotherapy: Secondary | ICD-10-CM | POA: Diagnosis not present

## 2016-08-30 DIAGNOSIS — D6489 Other specified anemias: Secondary | ICD-10-CM

## 2016-08-30 DIAGNOSIS — F5089 Other specified eating disorder: Secondary | ICD-10-CM

## 2016-08-30 DIAGNOSIS — Z79899 Other long term (current) drug therapy: Secondary | ICD-10-CM

## 2016-08-30 DIAGNOSIS — Z95828 Presence of other vascular implants and grafts: Secondary | ICD-10-CM | POA: Insufficient documentation

## 2016-08-30 DIAGNOSIS — R110 Nausea: Secondary | ICD-10-CM

## 2016-08-30 LAB — URINALYSIS COMPLETE WITH MICROSCOPIC (ARMC ONLY)
Bacteria, UA: NONE SEEN
Bilirubin Urine: NEGATIVE
Glucose, UA: NEGATIVE mg/dL
Hgb urine dipstick: NEGATIVE
Ketones, ur: NEGATIVE mg/dL
Leukocytes, UA: NEGATIVE
Nitrite: NEGATIVE
Protein, ur: 100 mg/dL — AB
Specific Gravity, Urine: 1.004 — ABNORMAL LOW (ref 1.005–1.030)
pH: 7 (ref 5.0–8.0)

## 2016-08-30 LAB — CBC WITH DIFFERENTIAL/PLATELET
Basophils Absolute: 0.2 10*3/uL — ABNORMAL HIGH (ref 0–0.1)
Basophils Relative: 1 %
Eosinophils Absolute: 0 10*3/uL (ref 0–0.7)
Eosinophils Relative: 0 %
HCT: 26.2 % — ABNORMAL LOW (ref 35.0–47.0)
Hemoglobin: 8.4 g/dL — ABNORMAL LOW (ref 12.0–16.0)
Lymphocytes Relative: 0 %
Lymphs Abs: 0.2 10*3/uL — ABNORMAL LOW (ref 1.0–3.6)
MCH: 31.7 pg (ref 26.0–34.0)
MCHC: 31.9 g/dL — ABNORMAL LOW (ref 32.0–36.0)
MCV: 99.4 fL (ref 80.0–100.0)
Monocytes Absolute: 1.1 10*3/uL — ABNORMAL HIGH (ref 0.2–0.9)
Monocytes Relative: 3 %
Neutro Abs: 33.8 10*3/uL — ABNORMAL HIGH (ref 1.4–6.5)
Neutrophils Relative %: 96 %
Platelets: 161 10*3/uL (ref 150–440)
RBC: 2.64 MIL/uL — ABNORMAL LOW (ref 3.80–5.20)
RDW: 18 % — ABNORMAL HIGH (ref 11.5–14.5)
WBC: 35.2 10*3/uL — ABNORMAL HIGH (ref 3.6–11.0)

## 2016-08-30 LAB — COMPREHENSIVE METABOLIC PANEL
ALT: 23 U/L (ref 14–54)
AST: 39 U/L (ref 15–41)
Albumin: 2.7 g/dL — ABNORMAL LOW (ref 3.5–5.0)
Alkaline Phosphatase: 123 U/L (ref 38–126)
Anion gap: 15 (ref 5–15)
BUN: 8 mg/dL (ref 6–20)
CO2: 16 mmol/L — ABNORMAL LOW (ref 22–32)
Calcium: 7.9 mg/dL — ABNORMAL LOW (ref 8.9–10.3)
Chloride: 101 mmol/L (ref 101–111)
Creatinine, Ser: 0.94 mg/dL (ref 0.44–1.00)
GFR calc Af Amer: >60 mL/min (ref >60)
GFR calc non Af Amer: >60 mL/min (ref >60)
Glucose, Bld: 222 mg/dL — ABNORMAL HIGH (ref 65–99)
Potassium: 4.6 mmol/L (ref 3.5–5.1)
Sodium: 132 mmol/L — ABNORMAL LOW (ref 135–145)
Total Bilirubin: 0.7 mg/dL (ref 0.3–1.2)
Total Protein: 7 g/dL (ref 6.5–8.1)

## 2016-08-30 LAB — MAGNESIUM: Magnesium: 1.8 mg/dL (ref 1.7–2.4)

## 2016-08-30 LAB — TSH: TSH: 3.298 u[IU]/mL (ref 0.350–4.500)

## 2016-08-30 LAB — T4, FREE: Free T4: 1.67 ng/dL — ABNORMAL HIGH (ref 0.61–1.12)

## 2016-08-30 MED ORDER — PROMETHAZINE HCL 25 MG/ML IJ SOLN
12.5000 mg | Freq: Once | INTRAMUSCULAR | Status: AC
  Administered 2016-08-30: 12.5 mg via INTRAVENOUS
  Filled 2016-08-30: qty 1

## 2016-08-30 MED ORDER — IBUPROFEN 200 MG PO TABS
400.0000 mg | ORAL_TABLET | Freq: Once | ORAL | Status: AC
  Administered 2016-08-30: 400 mg via ORAL
  Filled 2016-08-30: qty 2

## 2016-08-30 MED ORDER — SODIUM CHLORIDE 0.9% FLUSH
10.0000 mL | Freq: Once | INTRAVENOUS | Status: AC
  Administered 2016-08-30: 10 mL via INTRAVENOUS
  Filled 2016-08-30: qty 10

## 2016-08-30 MED ORDER — HEPARIN SOD (PORK) LOCK FLUSH 100 UNIT/ML IV SOLN
500.0000 [IU] | Freq: Once | INTRAVENOUS | Status: AC
  Administered 2016-08-30: 500 [IU] via INTRAVENOUS
  Filled 2016-08-30: qty 5

## 2016-08-30 MED ORDER — SODIUM CHLORIDE 0.9 % IV SOLN
INTRAVENOUS | Status: AC
  Administered 2016-08-30: 11:00:00 via INTRAVENOUS
  Filled 2016-08-30: qty 1000

## 2016-08-30 MED ORDER — LEVOFLOXACIN 500 MG PO TABS: 500.0000 mg | ORAL_TABLET | Freq: Every day | ORAL | 0 refills | Status: DC

## 2016-08-30 NOTE — Progress Notes (Signed)
Christina [facility] returns to clinic this morning for consideration of week 5 Nivolumab infusion on the SWOG S1400I research study. She was not able to receive her palliative radiation treatment this morning due to nausea, vomiting and weakness. She was also noted to have tachycardia with reported hr of 163 in radiation, but manual recheck HR was 131. Temp is 98.1 and b/p is 106/67. Patient report feeling very weak and is in the exam room bed for her visit with Dr. Mike Gip. CBC shows a WBC of 35,200 and her ANC is 33,800. Patient reporting numerous physical symptoms including frequent cough productive of thick cream colored phlegm, runny nose which she reports is due to allergies, feeling lightheaded, but not dizzy and experiencing increased SOB this morning. She c/o swelling in her feet and legs as well as numbness and tingling on the bottoms of her feet; however on examination, she has not pedal edema and is wearing TED hose, which patient states she wears daily due to her history of blood clots. Reports she has experienced chills this weekend alternating with hot flashes. Dr. Mike Gip has discussed with Christina [facility] that she needs to be admitted to the hospital and have further tests completed; however the patient refuses hospitalization and gives many reasons for her objections. She states she is willing to come to the clinic every day if needed to get better, but will not come into the hospital. Dr. Mike Gip clarifies with the patient whether she still wants to treat the cancer, or if she is ready for hospice. Christina [facility] is adamant that she still wants to treat the cancer and declines being made a DNR at this time. Dr. Mike Gip explained to her that he plans to hold her scheduled treatment (Nivolumab) for today and will give her hydration fluids. She also plans to have blood cultures drawn and get a U/A, an ECG and a chest xray in effort to rule out any infection due to the patients elevated white count,  tachycardia, chills, weakness, lightheadedness, nausea, vomiting, cough and SOB. Christina [facility] reluctantly agrees with this plan, and reiterates to Dr. Mike Gip that she has to work and has no choice financially until November 1st. Patient will receive 1000 ml NS over 3 hours today. Adverse events with grade and attribution as follows: Christina [facility], BSN, MHA, OCN 08/30/2016 12:10 PM  Study/Protocol: SWOG S1400I Cycle: week 5  Event Grade Onset Date Resolved Date Drug Nivolumab Ipilimumab Attribution Treatment Comments  Hyponatermia 1 07/29/16   unrelated  IV fluids  Hypokalemia 2 07/29/16 08/30/16  unrelated  Oral potassium, pt. Refused IV potassium  Weight loss 2 03/29/16   unrelated  Megace Rx  for 20 lb weight loss in <5 mos  Elevated WBC No grade 07/29/16   unrelated  WBC 35,200  Back Pain 2 07/27/16-gr3 08/16/16-gr2   unrelated Palliative XRT of spine/pelvis Patient still able to work and perform ADLs  Hypocalcemia 1 07/29/16   unrelated  Taking Xgeva corrected Ca: 8.16  Hyperglycemia 2 07/29/16   unrelated  Not fasting  Hypoalbuminemia 2 07/29/16   unrelated    Nausea 2 08/02/2016  Ipilimumab possibly    Vomiting 2 08/02/2016  Ipilimumab possibly  Pt. Reports vomiting everything except frozen grapes cream of wheat & broth   Anemia 2 08/23/2016  Ipilimumab related  To begin Procrit injections 08/23/2016  Sinus tachycardia 1 Baseline from 04/29/16   unrelated  heartrate 131-163 this AM: sinus tachycardia per ECG   Proteinuria 2 08/30/16   unrelated  Not  a 24 hour urine sample/random u/a  Christina [facility], BSN, MHA, OCN 08/30/2016 12:10 PM

## 2016-08-30 NOTE — Progress Notes (Signed)
Kim from radiation came to let me know that pt could not get radiation today due to vomiting. The patient states that usually after she eats 3-4 hours later she starts vomiting but today it started sooner than it usually does. She stays thirsty all the time and she drinks water, gatorade, soda.  This morning she vomited her raisin bran back up. Some days it is phlegm that she vomits up. She is very tired, weak, and hurting if left and right side at rib area, her lower back going toward her mid back area. Rating pain 7 and takes ibuprofen during day and tramadol at night but this am she took a pain pill.  She also states that her toes and soles/feet swollen and she states it has been that way for several weeks. Seh elevates them on pillows at night because of her previous clot.

## 2016-08-30 NOTE — Telephone Encounter (Signed)
Pt called at 4:30 this afternoon to state that she just looked at her schedule and she is to come in every day this week for fluids and she can not afford to do that.  She does not want any more of that nausea med she got today all it made her do was sleep and she can't go to work feeling that way.  She has to go to work and make it or she does not get paid and has no money to pay all her bills-it makes it tight.  I told her that due to how bad she was feeling Dr. Mike Gip wanted her to be admitted to the hospital today and she refused to be admitted.  This was the compromise they made today in clinic. I told her that I understand that from a financial standpoint she is struggling and from medical standpoint is where dr Mike Gip is concerned and if she does not want to go into the hospital then this is the only choice she had .  Patient willing to come tom. And she will play every day by how she feels and if she is coming but she will come tom.

## 2016-08-30 NOTE — Telephone Encounter (Signed)
Rosa states that she tried several times to get blood from port and nothing she does not get any blood out of ports for lab draw.  Patient states she has had a bad day and she is not going to get stuck again for more labs.  She was stuck 3 times this morning and they blew and at the end of the 3rd stick after they got the tubes it blew too. She is willing to go get cxray and then go home. MD notified that pt refuses.

## 2016-08-31 ENCOUNTER — Inpatient Hospital Stay: Payer: BLUE CROSS/BLUE SHIELD

## 2016-08-31 ENCOUNTER — Ambulatory Visit: Payer: BLUE CROSS/BLUE SHIELD

## 2016-08-31 ENCOUNTER — Telehealth: Payer: Self-pay | Admitting: *Deleted

## 2016-08-31 LAB — URINE CULTURE: Culture: NO GROWTH

## 2016-08-31 NOTE — Telephone Encounter (Signed)
Pt called earlier today and said she could not come in today.she left it on appt message line.  She called back this afternoon to talk to me about why she could not come in. She was so droggy from taking nausea med IV yest that she had and in addition in middle of night she took tramadol for her back and she could not drive this am to come.  She has ate several times today and she di not vomit today at all.  She is willing to come in tom. To have fluids and they can draw blood from port but if port does not work then no lab draws and dr Mike Gip aware of that pt refuses to get venipuncture from her arm if port does not work. She still wants her to get fluids.  Patient wants port needle to come out after fluids. She states it hurt her in middle of night with needle in.  She also asked about whether her tsh was back and it was back and it is normal however her free T4 is elevated and [facility] still wants her to have endocrine appt for 10/26.

## 2016-09-01 ENCOUNTER — Other Ambulatory Visit: Payer: Self-pay | Admitting: Hematology and Oncology

## 2016-09-01 ENCOUNTER — Ambulatory Visit: Payer: BLUE CROSS/BLUE SHIELD

## 2016-09-01 ENCOUNTER — Encounter: Payer: Self-pay | Admitting: *Deleted

## 2016-09-01 ENCOUNTER — Inpatient Hospital Stay: Payer: BLUE CROSS/BLUE SHIELD

## 2016-09-01 ENCOUNTER — Telehealth: Payer: Self-pay | Admitting: *Deleted

## 2016-09-01 VITALS — BP 103/68 | HR 112 | Temp 96.7°F | Resp 16

## 2016-09-01 DIAGNOSIS — T82594A Other mechanical complication of infusion catheter, initial encounter: Secondary | ICD-10-CM

## 2016-09-01 DIAGNOSIS — C3492 Malignant neoplasm of unspecified part of left bronchus or lung: Secondary | ICD-10-CM

## 2016-09-01 DIAGNOSIS — E876 Hypokalemia: Secondary | ICD-10-CM

## 2016-09-01 DIAGNOSIS — C7951 Secondary malignant neoplasm of bone: Secondary | ICD-10-CM

## 2016-09-01 DIAGNOSIS — R7989 Other specified abnormal findings of blood chemistry: Secondary | ICD-10-CM

## 2016-09-01 DIAGNOSIS — Z5111 Encounter for antineoplastic chemotherapy: Secondary | ICD-10-CM | POA: Diagnosis not present

## 2016-09-01 DIAGNOSIS — C3490 Malignant neoplasm of unspecified part of unspecified bronchus or lung: Secondary | ICD-10-CM

## 2016-09-01 DIAGNOSIS — G893 Neoplasm related pain (acute) (chronic): Secondary | ICD-10-CM

## 2016-09-01 DIAGNOSIS — C787 Secondary malignant neoplasm of liver and intrahepatic bile duct: Secondary | ICD-10-CM

## 2016-09-01 DIAGNOSIS — E46 Unspecified protein-calorie malnutrition: Secondary | ICD-10-CM

## 2016-09-01 LAB — COMPREHENSIVE METABOLIC PANEL
ALT: 21 U/L (ref 14–54)
ALT: 23 U/L (ref 14–54)
AST: 27 U/L (ref 15–41)
AST: 29 U/L (ref 15–41)
Albumin: 2.1 g/dL — ABNORMAL LOW (ref 3.5–5.0)
Albumin: 2.3 g/dL — ABNORMAL LOW (ref 3.5–5.0)
Alkaline Phosphatase: 92 U/L (ref 38–126)
Alkaline Phosphatase: 99 U/L (ref 38–126)
Anion gap: 7 (ref 5–15)
Anion gap: 11 (ref 5–15)
BUN: 5 mg/dL — ABNORMAL LOW (ref 6–20)
BUN: 6 mg/dL (ref 6–20)
CO2: 19 mmol/L — ABNORMAL LOW (ref 22–32)
CO2: 17 mmol/L — ABNORMAL LOW (ref 22–32)
Calcium: 6.6 mg/dL — ABNORMAL LOW (ref 8.9–10.3)
Calcium: 6.8 mg/dL — ABNORMAL LOW (ref 8.9–10.3)
Chloride: 105 mmol/L (ref 101–111)
Chloride: 104 mmol/L (ref 101–111)
Creatinine, Ser: 0.51 mg/dL (ref 0.44–1.00)
Creatinine, Ser: 0.46 mg/dL (ref 0.44–1.00)
GFR calc Af Amer: >60 mL/min (ref >60)
GFR calc Af Amer: >60 mL/min (ref >60)
GFR calc non Af Amer: >60 mL/min (ref >60)
GFR calc non Af Amer: >60 mL/min (ref >60)
Glucose, Bld: 120 mg/dL — ABNORMAL HIGH (ref 65–99)
Glucose, Bld: 116 mg/dL — ABNORMAL HIGH (ref 65–99)
Potassium: 2.7 mmol/L — CL (ref 3.5–5.1)
Potassium: 2.9 mmol/L — ABNORMAL LOW (ref 3.5–5.1)
Sodium: 131 mmol/L — ABNORMAL LOW (ref 135–145)
Sodium: 132 mmol/L — ABNORMAL LOW (ref 135–145)
Total Bilirubin: 0.5 mg/dL (ref 0.3–1.2)
Total Bilirubin: 0.5 mg/dL (ref 0.3–1.2)
Total Protein: 5.1 g/dL — ABNORMAL LOW (ref 6.5–8.1)
Total Protein: 5.6 g/dL — ABNORMAL LOW (ref 6.5–8.1)

## 2016-09-01 LAB — CBC WITH DIFFERENTIAL/PLATELET
Basophils Absolute: 0 10*3/uL (ref 0–0.1)
Basophils Absolute: 0.1 10*3/uL (ref 0–0.1)
Basophils Relative: 0 %
Basophils Relative: 0 %
Eosinophils Absolute: 0.2 10*3/uL (ref 0–0.7)
Eosinophils Absolute: 0.2 10*3/uL (ref 0–0.7)
Eosinophils Relative: 1 %
Eosinophils Relative: 1 %
HCT: 18 % — ABNORMAL LOW (ref 35.0–47.0)
HCT: 20.8 % — ABNORMAL LOW (ref 35.0–47.0)
Hemoglobin: 5.9 g/dL — ABNORMAL LOW (ref 12.0–16.0)
Hemoglobin: 6.6 g/dL — ABNORMAL LOW (ref 12.0–16.0)
Lymphocytes Relative: 3 %
Lymphocytes Relative: 3 %
Lymphs Abs: 0.4 10*3/uL — ABNORMAL LOW (ref 1.0–3.6)
Lymphs Abs: 0.5 10*3/uL — ABNORMAL LOW (ref 1.0–3.6)
MCH: 31.8 pg (ref 26.0–34.0)
MCH: 31.6 pg (ref 26.0–34.0)
MCHC: 32.6 g/dL (ref 32.0–36.0)
MCHC: 31.9 g/dL — ABNORMAL LOW (ref 32.0–36.0)
MCV: 97.4 fL (ref 80.0–100.0)
MCV: 99.3 fL (ref 80.0–100.0)
Monocytes Absolute: 1.2 10*3/uL — ABNORMAL HIGH (ref 0.2–0.9)
Monocytes Absolute: 1.3 10*3/uL — ABNORMAL HIGH (ref 0.2–0.9)
Monocytes Relative: 7 %
Monocytes Relative: 9 %
Neutro Abs: 14.5 10*3/uL — ABNORMAL HIGH (ref 1.4–6.5)
Neutro Abs: 13.8 10*3/uL — ABNORMAL HIGH (ref 1.4–6.5)
Neutrophils Relative %: 89 %
Neutrophils Relative %: 87 %
Platelets: 95 10*3/uL — ABNORMAL LOW (ref 150–440)
Platelets: 121 10*3/uL — ABNORMAL LOW (ref 150–440)
RBC: 1.85 MIL/uL — ABNORMAL LOW (ref 3.80–5.20)
RBC: 2.1 MIL/uL — ABNORMAL LOW (ref 3.80–5.20)
RDW: 17.8 % — ABNORMAL HIGH (ref 11.5–14.5)
RDW: 18.1 % — ABNORMAL HIGH (ref 11.5–14.5)
WBC: 16.4 10*3/uL — ABNORMAL HIGH (ref 3.6–11.0)
WBC: 15.8 10*3/uL — ABNORMAL HIGH (ref 3.6–11.0)

## 2016-09-01 LAB — SAMPLE TO BLOOD BANK

## 2016-09-01 LAB — LACTIC ACID, PLASMA: Lactic Acid, Venous: 1 mmol/L (ref 0.5–1.9)

## 2016-09-01 MED ORDER — ALTEPLASE 2 MG IJ SOLR
2.0000 mg | Freq: Once | INTRAMUSCULAR | Status: AC
  Administered 2016-09-01: 2 mg
  Filled 2016-09-01: qty 2

## 2016-09-01 MED ORDER — ALTEPLASE 2 MG IJ SOLR: 2.0000 mg | Freq: Once | INTRAMUSCULAR | Status: AC

## 2016-09-01 MED ORDER — HEPARIN SOD (PORK) LOCK FLUSH 100 UNIT/ML IV SOLN
500.0000 [IU] | Freq: Once | INTRAVENOUS | Status: AC
  Administered 2016-09-01: 500 [IU] via INTRAVENOUS
  Filled 2016-09-01: qty 5

## 2016-09-01 MED ORDER — SODIUM CHLORIDE 0.9% FLUSH
10.0000 mL | Freq: Once | INTRAVENOUS | Status: AC
  Administered 2016-09-01: 10 mL via INTRAVENOUS
  Filled 2016-09-01: qty 10

## 2016-09-01 MED ORDER — SODIUM CHLORIDE 0.9 % IV SOLN
INTRAVENOUS | Status: AC
  Administered 2016-09-01: 09:00:00 via INTRAVENOUS
  Filled 2016-09-01 (×2): qty 1000

## 2016-09-01 NOTE — Telephone Encounter (Signed)
Called pt and she just got home. The pt was not able to get blood drawn from  [facility] this am and late morning Dr. Mike Gip ordered cath flow to un clog the port ( Patient previously yest had cxray which showed the port in good position and no flow issues).   After the cath flow they were able to draw back blood and the labs were drawn but  Results came back very abnormal in comparison to the labs she had 2 days ago.  Dr. Mike Gip wanted the labs repeated and she felt like between the fluids today and cathflow it made labs not accurate. When I asked pt to come back she states, She is worn out and stomach cramping and feels like she is going to the bathroom like she is going to have diarrhea. She is so tired she is not going to come back and get labs redone.  She says she does not have the energy to come back over and I asked her if she could get her dad to come bring her and she said she is not coming and not going to ask her Dad.  She is worn out and she is going to lay down and rest for 2 hours and I can call her after that. I can call her at  5 pm to check on her. I asked if she was coming tom. As sch. And at this time she does not have answer.

## 2016-09-01 NOTE — Progress Notes (Signed)
The patient returned to clinic today for IV fluids. Mirian Mo, RN and Yolande Jolly, RN met with her and discussed possible options for treatment including TPN. The patient stated that if she requires nutritional supplement through an IV that she prefers to get it through the ED department and does not want to be admitted to the hospital. It was explained to her that the ED does not administer TPN and that she will need to consider admission to the hospital or home health if that is the plan. She also refuses home health. She prefers to try and eat solid foods as long as she can eat small portions and keep it down. This morning she reports that she ate small portions of peanut butter crackers and was able to keep it down. Following her visit on Monday August 30, 2016, she reports that the medication she was given through her IV made her too sleepy and that she was unable to work or do anything, she stated "It made me sleep most of the day". She is very concerned about work and continues to voice concerns about not being able to function so that she can continue to work and pay her bills. This nurse asked the patient if she had met with Elease Etienne, Patient Services Navigator, and the patient responded with a "yes" but did not discuss further. This nurse met with him to make second referral and he stated that he has offered services and it is up to the patient to accept at this time. Results of today's labs show critical values of k+=2.7, calcium=6.6, albumin=2.1, WBC=16.4, hemoglobin=5.9, platelets= 95,000. Discussed possibility of diluted specimen due to the time labs were drawn (following IV fluids) with Dr. Mike Gip. Dr. Kem Parkinson nurse contacted the patient to return to clinic for labs at her recommendation to determine if the they were diluted from earlier today and the patient adamantly refused to return to clinic today.  Dr. Kem Parkinson nurse reports the patient stated that she can call her back at 5:00pm  today and she will let us know if she plans to return in the morning for labs. The infusion nurses reported that on 08/30/2016 as well as today (09/01/2016) the patient ate and drank multiple times and did not vomit while in clinic. They also report she did not complain of nausea at both visits. The radiation tech reports that on Monday prior to her appointment, she stated that she was feeling sick and needed to go to the restroom. He states that he did not witness any vomiting at that time.  Mirian Mo, RN, BSN 09/01/2016 11:56 AM   The patient was contacted by telephone to follow up on health status and to ask if she could return to clinic to have labs drawn. The patient states that she is tired and worn out and needs to rest. This nurse explained that it is very important from a health and safety standpoint since her earlier labs were of critical values. The patient stated she is not able to come back to the office and that she needs rest so she can go back to work and pay her bills. This nurse asked the patient if she had met with the PSN and she said yes but that she still needed to work to pay her other bills. This nurse asked her if there was someone who could bring her in this afternoon and the patient stated, "no".  This nurse then asked the patient what she would be willing to  do at this point and the patient stated she would put her clothes on and come back to the clinic. The patient stated she was very frustrated at having to come back to the clinic. She demanded that this nurse or someone meet her in the lobby and walk her back to the lab immediately upon her arrival. She stated she would not come back at the 8:00am scheduled appointment tomorrow.   Mirian Mo, RN, BSN 09/01/2016 4:59 PM   This nurse spoke with Dr. Mike Gip and explained that the patient agreed to return to clinic for labs. Dr. Mike Gip ordered, CBC with diff, CMet and Hold Tube. The patient arrived at 3:50pm and was escorted to  the lab by this nurse. The labs as ordered by Dr. Mike Gip were drawn through the patient's port because patient refused peripheral stick. The patient displayed an angry affect and complained inconsolably about having to return to the clinic. Apology made for her inconvenience and attempted to reassure the patient that this was necessary to validate earlier critical lab values for Dr. Mike Gip to formulate a more appropriate plan of care.  The patient requested a letter to return to work without restriction, Dr. Mike Gip aware of request. Updated AE profile below as follows. Will complete CTEP-AERS reporting per protocol guidelines.   Mirian Mo, RN, BSN 09/01/2016 5:04 PM        Study/Protocol: SWOG [id number] Cycle: week 5  Event Grade Onset Date Resolved Date Drug Nivolumab Ipilimumab Attribution Treatment Comments  Hyponatremia 1 07/29/16   unrelated  IV fluids  Hypokalemia 3 09/01/2016   unrelated  Oral potassium, pt. Refused IV potassium  Weight loss 2 03/29/16   unrelated  Megace Rx  for 20 lb weight loss in <5 mos  Elevated WBC No grade 07/29/16   unrelated  WBC 35,200  Back Pain 2 baseline   unrelated Palliative XRT of spine/pelvis Patient still able to work and perform ADLs  Hypocalcemia 2 09/01/2016   unrelated  Taking Xgeva corrected Ca: 7.55  Hyperglycemia 1 09/01/2016   unrelated  Not fasting  Hypoalbuminemia 2 07/29/16   unrelated    Nausea 2 08/02/2016  Ipilimumab unlikely  Per patient report  Vomiting 2 08/02/2016  Ipilimumab unlikely  Pt. Reports vomiting everything except frozen grapes cream of wheat & broth   Anemia 3 09/01/2016   unrelated  To begin Procrit injections 08/23/2016  Sinus tachycardia 1 baseline   unrelated  heartrate 131-163 this AM: sinus tachycardia per ECG   Proteinuria 2 08/30/16   unrelated  Not a 24 hour urine sample/random u/a, U/A not repeated to validate  Thrombocytopenia 1 09/01/2016   unrelated    Mirian Mo, RN,  BSN 09/02/2016 10:39 AM

## 2016-09-01 NOTE — Telephone Encounter (Signed)
K+ 2.7  MD notified.

## 2016-09-01 NOTE — Progress Notes (Signed)
Unable to draw labs from port this am. Pt refuses to let lab draw peripherally. Dr Mike Gip aware.

## 2016-09-01 NOTE — Progress Notes (Signed)
Received call report from [place] lab at [phone] regarding potassium 2.7.  Called [place] office and informed Dr. Mike Gip team.

## 2016-09-02 ENCOUNTER — Ambulatory Visit: Payer: BLUE CROSS/BLUE SHIELD

## 2016-09-02 ENCOUNTER — Inpatient Hospital Stay: Payer: BLUE CROSS/BLUE SHIELD

## 2016-09-02 ENCOUNTER — Other Ambulatory Visit: Payer: Self-pay | Admitting: *Deleted

## 2016-09-02 ENCOUNTER — Telehealth: Payer: Self-pay | Admitting: *Deleted

## 2016-09-02 DIAGNOSIS — C3432 Malignant neoplasm of lower lobe, left bronchus or lung: Secondary | ICD-10-CM | POA: Diagnosis not present

## 2016-09-02 DIAGNOSIS — F5089 Other specified eating disorder: Secondary | ICD-10-CM | POA: Diagnosis not present

## 2016-09-02 DIAGNOSIS — R0602 Shortness of breath: Secondary | ICD-10-CM | POA: Diagnosis not present

## 2016-09-02 DIAGNOSIS — Z86718 Personal history of other venous thrombosis and embolism: Secondary | ICD-10-CM | POA: Diagnosis not present

## 2016-09-02 DIAGNOSIS — R112 Nausea with vomiting, unspecified: Secondary | ICD-10-CM | POA: Diagnosis not present

## 2016-09-02 DIAGNOSIS — C7951 Secondary malignant neoplasm of bone: Secondary | ICD-10-CM

## 2016-09-02 DIAGNOSIS — E876 Hypokalemia: Secondary | ICD-10-CM | POA: Diagnosis not present

## 2016-09-02 DIAGNOSIS — R05 Cough: Secondary | ICD-10-CM | POA: Diagnosis not present

## 2016-09-02 DIAGNOSIS — E46 Unspecified protein-calorie malnutrition: Secondary | ICD-10-CM | POA: Diagnosis not present

## 2016-09-02 DIAGNOSIS — R6883 Chills (without fever): Secondary | ICD-10-CM | POA: Diagnosis not present

## 2016-09-02 DIAGNOSIS — Z5111 Encounter for antineoplastic chemotherapy: Secondary | ICD-10-CM | POA: Diagnosis present

## 2016-09-02 DIAGNOSIS — R000 Tachycardia, unspecified: Secondary | ICD-10-CM | POA: Diagnosis not present

## 2016-09-02 DIAGNOSIS — D72829 Elevated white blood cell count, unspecified: Secondary | ICD-10-CM | POA: Diagnosis not present

## 2016-09-02 DIAGNOSIS — R531 Weakness: Secondary | ICD-10-CM | POA: Diagnosis not present

## 2016-09-02 DIAGNOSIS — Z79899 Other long term (current) drug therapy: Secondary | ICD-10-CM | POA: Diagnosis not present

## 2016-09-02 DIAGNOSIS — R5381 Other malaise: Secondary | ICD-10-CM | POA: Diagnosis not present

## 2016-09-02 DIAGNOSIS — D6489 Other specified anemias: Secondary | ICD-10-CM | POA: Diagnosis not present

## 2016-09-02 DIAGNOSIS — G893 Neoplasm related pain (acute) (chronic): Secondary | ICD-10-CM | POA: Diagnosis not present

## 2016-09-02 DIAGNOSIS — Z006 Encounter for examination for normal comparison and control in clinical research program: Secondary | ICD-10-CM | POA: Diagnosis not present

## 2016-09-02 DIAGNOSIS — C787 Secondary malignant neoplasm of liver and intrahepatic bile duct: Secondary | ICD-10-CM

## 2016-09-02 DIAGNOSIS — E860 Dehydration: Secondary | ICD-10-CM | POA: Diagnosis not present

## 2016-09-02 DIAGNOSIS — C3492 Malignant neoplasm of unspecified part of left bronchus or lung: Secondary | ICD-10-CM

## 2016-09-02 DIAGNOSIS — F17210 Nicotine dependence, cigarettes, uncomplicated: Secondary | ICD-10-CM | POA: Diagnosis not present

## 2016-09-02 NOTE — Telephone Encounter (Signed)
Pt called about if her appt has been made yet for Monday, she also would like ct to be done on sat.  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  I checked with [facility] and they only do MRI on sat but not CT.  Got appt Monday am in [place] arrive 10:45 for ct and come to cancer center in [place]  1 pm for labs and 1;15 to see MD and then treatment. At this time that is all we have to offer.  She can call back to talk to me about this and left my number

## 2016-09-02 NOTE — Progress Notes (Signed)
Corrected grading for back pain is grade 2 instead of grade 3.  Mirian Mo, RN, BSN 09/02/2016 3:17 PM

## 2016-09-03 ENCOUNTER — Telehealth: Payer: Self-pay | Admitting: *Deleted

## 2016-09-03 ENCOUNTER — Inpatient Hospital Stay: Payer: BLUE CROSS/BLUE SHIELD

## 2016-09-03 ENCOUNTER — Ambulatory Visit: Payer: BLUE CROSS/BLUE SHIELD

## 2016-09-03 MED ORDER — CYCLOBENZAPRINE HCL 5 MG PO TABS: 5.0000 mg | ORAL_TABLET | Freq: Three times a day (TID) | ORAL | 0 refills | Status: DC | PRN

## 2016-09-03 NOTE — Telephone Encounter (Signed)
Pt called today to say she feels like she sleep wrong, her back and stomach is hurting.  She had a good day of eating and drinking and taking meds. We would be proud of her she said.  I told her she could come in and get pain medicine and fluids here and she says she is not coming.  She is staying home and trying to lay down-she feels like muscles are tight and she needs them to unwind so she was hoping to have dr Mike Gip to call in muscle relaxer and if not she will try tramadol and lay down and rest.  I told her that I would ask and she said yes to muscle relaxer and I told her it if 5 mg and she can take up to 3 a day and it will make her sleepy and she will have daughter pick up rx on [facility] and the med was sent to that pharmacy.

## 2016-09-06 ENCOUNTER — Other Ambulatory Visit: Payer: Self-pay | Admitting: *Deleted

## 2016-09-06 ENCOUNTER — Encounter: Payer: Self-pay | Admitting: Hematology and Oncology

## 2016-09-06 ENCOUNTER — Ambulatory Visit
Admission: RE | Admit: 2016-09-06 | Discharge: 2016-09-06 | Disposition: A | Discharge status: Home / Self Care | Payer: BLUE CROSS/BLUE SHIELD | Source: Ambulatory Visit | Attending: Internal Medicine | Admitting: Internal Medicine

## 2016-09-06 ENCOUNTER — Inpatient Hospital Stay: Payer: BLUE CROSS/BLUE SHIELD

## 2016-09-06 ENCOUNTER — Inpatient Hospital Stay (HOSPITAL_BASED_OUTPATIENT_CLINIC_OR_DEPARTMENT_OTHER): Payer: BLUE CROSS/BLUE SHIELD | Admitting: Hematology and Oncology

## 2016-09-06 ENCOUNTER — Telehealth: Payer: Self-pay | Admitting: *Deleted

## 2016-09-06 ENCOUNTER — Ambulatory Visit: Payer: BLUE CROSS/BLUE SHIELD

## 2016-09-06 ENCOUNTER — Telehealth: Payer: Self-pay | Admitting: Hematology and Oncology

## 2016-09-06 ENCOUNTER — Encounter: Payer: Self-pay | Admitting: *Deleted

## 2016-09-06 VITALS — BP 106/71 | HR 125 | Temp 96.6°F | Wt 93.0 lb

## 2016-09-06 DIAGNOSIS — D649 Anemia, unspecified: Secondary | ICD-10-CM

## 2016-09-06 DIAGNOSIS — D6489 Other specified anemias: Secondary | ICD-10-CM

## 2016-09-06 DIAGNOSIS — C3432 Malignant neoplasm of lower lobe, left bronchus or lung: Secondary | ICD-10-CM | POA: Diagnosis not present

## 2016-09-06 DIAGNOSIS — C7951 Secondary malignant neoplasm of bone: Secondary | ICD-10-CM | POA: Diagnosis not present

## 2016-09-06 DIAGNOSIS — E876 Hypokalemia: Secondary | ICD-10-CM

## 2016-09-06 DIAGNOSIS — F17210 Nicotine dependence, cigarettes, uncomplicated: Secondary | ICD-10-CM

## 2016-09-06 DIAGNOSIS — R112 Nausea with vomiting, unspecified: Secondary | ICD-10-CM | POA: Diagnosis not present

## 2016-09-06 DIAGNOSIS — C3412 Malignant neoplasm of upper lobe, left bronchus or lung: Secondary | ICD-10-CM

## 2016-09-06 DIAGNOSIS — C787 Secondary malignant neoplasm of liver and intrahepatic bile duct: Secondary | ICD-10-CM

## 2016-09-06 DIAGNOSIS — E860 Dehydration: Secondary | ICD-10-CM

## 2016-09-06 DIAGNOSIS — C3492 Malignant neoplasm of unspecified part of left bronchus or lung: Secondary | ICD-10-CM

## 2016-09-06 DIAGNOSIS — E46 Unspecified protein-calorie malnutrition: Secondary | ICD-10-CM

## 2016-09-06 DIAGNOSIS — F5089 Other specified eating disorder: Secondary | ICD-10-CM

## 2016-09-06 DIAGNOSIS — D72829 Elevated white blood cell count, unspecified: Secondary | ICD-10-CM | POA: Diagnosis not present

## 2016-09-06 DIAGNOSIS — G893 Neoplasm related pain (acute) (chronic): Secondary | ICD-10-CM

## 2016-09-06 DIAGNOSIS — R05 Cough: Secondary | ICD-10-CM

## 2016-09-06 DIAGNOSIS — R0602 Shortness of breath: Secondary | ICD-10-CM

## 2016-09-06 DIAGNOSIS — R6883 Chills (without fever): Secondary | ICD-10-CM

## 2016-09-06 DIAGNOSIS — R531 Weakness: Secondary | ICD-10-CM

## 2016-09-06 DIAGNOSIS — R918 Other nonspecific abnormal finding of lung field: Secondary | ICD-10-CM | POA: Insufficient documentation

## 2016-09-06 DIAGNOSIS — Z79899 Other long term (current) drug therapy: Secondary | ICD-10-CM

## 2016-09-06 LAB — CBC WITH DIFFERENTIAL/PLATELET
Basophils Absolute: 0 10*3/uL (ref 0–0.1)
Basophils Relative: 0 %
Eosinophils Absolute: 0 10*3/uL (ref 0–0.7)
Eosinophils Relative: 0 %
HCT: 19.8 % — ABNORMAL LOW (ref 35.0–47.0)
Hemoglobin: 6.4 g/dL — ABNORMAL LOW (ref 12.0–16.0)
Lymphocytes Relative: 1 %
Lymphs Abs: 0.1 10*3/uL — ABNORMAL LOW (ref 1.0–3.6)
MCH: 31.6 pg (ref 26.0–34.0)
MCHC: 32.2 g/dL (ref 32.0–36.0)
MCV: 98.2 fL (ref 80.0–100.0)
Monocytes Absolute: 1.6 10*3/uL — ABNORMAL HIGH (ref 0.2–0.9)
Monocytes Relative: 6 %
Neutro Abs: 23.3 10*3/uL — ABNORMAL HIGH (ref 1.4–6.5)
Neutrophils Relative %: 93 %
Platelets: 87 10*3/uL — ABNORMAL LOW (ref 150–440)
RBC: 2.02 MIL/uL — ABNORMAL LOW (ref 3.80–5.20)
RDW: 18.9 % — ABNORMAL HIGH (ref 11.5–14.5)
WBC: 25 10*3/uL — ABNORMAL HIGH (ref 3.6–11.0)

## 2016-09-06 LAB — CULTURE, BLOOD (ROUTINE X 2)
Culture: NO GROWTH
Culture: NO GROWTH

## 2016-09-06 LAB — BASIC METABOLIC PANEL
Anion gap: 14 (ref 5–15)
BUN: 11 mg/dL (ref 6–20)
CO2: 19 mmol/L — ABNORMAL LOW (ref 22–32)
Calcium: 8.9 mg/dL (ref 8.9–10.3)
Chloride: 98 mmol/L — ABNORMAL LOW (ref 101–111)
Creatinine, Ser: 0.95 mg/dL (ref 0.44–1.00)
GFR calc Af Amer: >60 mL/min (ref >60)
GFR calc non Af Amer: >60 mL/min (ref >60)
Glucose, Bld: 177 mg/dL — ABNORMAL HIGH (ref 65–99)
Potassium: 2.8 mmol/L — ABNORMAL LOW (ref 3.5–5.1)
Sodium: 131 mmol/L — ABNORMAL LOW (ref 135–145)

## 2016-09-06 LAB — SAMPLE TO BLOOD BANK

## 2016-09-06 MED ORDER — SODIUM CHLORIDE 0.9% FLUSH
10.0000 mL | INTRAVENOUS | Status: DC | PRN
  Administered 2016-09-06: 10 mL via INTRAVENOUS
  Filled 2016-09-06: qty 10

## 2016-09-06 MED ORDER — SODIUM CHLORIDE 0.9 % IV SOLN
INTRAVENOUS | Status: DC
  Administered 2016-09-06: 14:00:00 via INTRAVENOUS
  Filled 2016-09-06 (×2): qty 100

## 2016-09-06 MED ORDER — HEPARIN SOD (PORK) LOCK FLUSH 100 UNIT/ML IV SOLN
500.0000 [IU] | Freq: Once | INTRAVENOUS | Status: AC
  Administered 2016-09-06: 500 [IU] via INTRAVENOUS
  Filled 2016-09-06: qty 5

## 2016-09-06 NOTE — Progress Notes (Signed)
[facility] day:  09/06/2016   Chief Complaint: Christina Mckee is a 55 y.o. female with metastatic squamous cell lung cancer who is seen for 1 week assessment prior to cycle #3 nivolumab + ipilimumab on SWOG S1400.   HPI: The patient was last seen in the medical oncology clinic on 08/30/2016.  At that time, she was lightheaded and tachycardic.  She had frothy thick phlegm.  She was eating poorly.  Nausea was not well controlled.  WBC was 35,200 with chills worrisome for infection.  Pain was controlled when she takes Tramadol or hydrocodone/Tylenol, but was not being used as she could not work while taking it.  She used ibuprofen prn.   She declined admission to the hospital.  A fever work-up was performed (blood and urine cultures + CXR).  CXR revealed a large cavitary mass with air-fluid level in the superior segment of the left lower lobe consistent with malignancy or abscess.  She was started on Levaquin.  EKG revealed sinus tachycardia.  We discussed her anemia and consideration of transfusion.  We discussed her nutrition and concern for her persistent nausea.  It was recommended that she take her anti-emetics 30 minutes prior to meals.  We discussed daily IVF in clinic.  We discussed trying different anti-emetics.  She tried Phenergan 12.5 mg which made her sleepy.  She states that she is is finished with radiation (missed 2 fractions).  She does not feel that it helped with her pain.  Symptomatically, she feels weak and lousy.  She states that she is unable to eat after 11 AM daily.  She states that before 11 AM, she can eat cereal with milk and keep it down.  She can snack on crackers, peanut butter, and popcorn and keep it down.  Liquids are generally well tolerated.  She can handle broth (chicken or vegetable).  Other foods are not tolerated and often are thrown up hours later.  She describes sipping on protein drinks.  She continues to have issues  with phlegm production.  She denies any fevers.  She feels "hot and cold".  She has had trouble keeping down her potassium.  She is taking her folic acid with her multi-vitamin.   Past Medical History:  Diagnosis Date  . DVT (deep venous thrombosis) ([facility])    in 2015  . Menopause    prior to 2014  . Squamous cell lung cancer [facility])     Past Surgical History:  Procedure Laterality Date  . BREAST CYST EXCISION     left breast removal  . CESAREAN SECTION    . IVC FILTER PLACEMENT ([facility] HX)    . PERIPHERAL VASCULAR CATHETERIZATION N/A 04/28/2016   Procedure: Glori Luis Cath Insertion;  Surgeon: Algernon Huxley, MD;  Location: [facility];  Service: Cardiovascular;  Laterality: N/A;    Family History  Problem Relation Age of Onset  . Breast cancer Mother   . Breast cancer Maternal Aunt   . Stomach cancer Paternal Uncle   . Leukemia Paternal Uncle     Social History:  reports that she has been smoking Cigarettes.  She has been smoking about 0.25 packs per day. She has never used smokeless tobacco. She reports that she drinks alcohol. She reports that she does not use drugs.  She has smoked 1/2-1 pack a day since age 85. She has a 30-40 pack year smoking history.  She drinks 4 beers a day. She lives in  [place] alone with her cats and dogs. She has a dog named Production assistant, radio (lab and shepherd mix).  She previously worked at [employer], but notes all positions were recently eliminated. She continues to work.  Thursdays are better treatment days for her.  Her daughter got married on 08/01/2016.  She has to work until 09/22/2016.  She is alone today.  Allergies:  Allergies  Allergen Reactions  . Penicillins Hives and Other (See Comments)    Has patient had a PCN reaction causing immediate rash, facial/tongue/throat swelling, SOB or lightheadedness with hypotension: No Has patient had a PCN reaction causing severe rash involving mucus membranes or skin necrosis: No Has patient had a PCN reaction that  required hospitalization No Has patient had a PCN reaction occurring within the last 10 years: No If all of the above answers are "NO", then may proceed with Cephalosporin use.    Current Medications: Current Outpatient Prescriptions  Medication Sig Dispense Refill  . cyclobenzaprine (FLEXERIL) 5 MG tablet Take 1 tablet (5 mg total) by mouth 3 (three) times daily as needed for muscle spasms. 20 tablet 0  . guaifenesin (ROBITUSSIN) 100 MG/5ML syrup Take 200 mg by mouth 3 (three) times daily as needed for cough or congestion.    Marland Kitchen ibuprofen (ADVIL,MOTRIN) 200 MG tablet Take by mouth.    Marland Kitchen KLOR-CON 10 10 MEQ tablet TAKE 3 TABS BY MOUTH IN THE MORNING AND 2 TABS BY MOUTH IN THE EVENING 150 tablet 0  . levofloxacin (LEVAQUIN) 500 MG tablet Take 1 tablet (500 mg total) by mouth daily. 7 tablet 0  . lidocaine-prilocaine (EMLA) cream Apply 1 application topically as needed. 30 g 6  . megestrol (MEGACE) 400 MG/10ML suspension Take 5 mLs (200 mg total) by mouth daily. 150 mL 1  . metoCLOPramide (REGLAN) 5 MG tablet Take 1 tablet (5 mg total) by mouth every 8 (eight) hours as needed for nausea. 30 tablet 1  . nicotine (NICODERM CQ - DOSED IN MG/24 HOURS) 21 mg/24hr patch Place 1 patch (21 mg total) onto the skin daily. 28 patch 0  . traMADol (ULTRAM) 50 MG tablet Take 1 tablet (50 mg total) by mouth every 6 (six) hours as needed. 60 tablet 0  . GuaiFENesin (MUCINEX PO) Take by mouth.    Marland Kitchen guaiFENesin-codeine (CHERATUSSIN AC) 100-10 MG/5ML syrup Take 5 mLs by mouth 3 (three) times daily as needed for cough. (Patient not taking: Reported on 09/06/2016) 120 mL 0  . ondansetron (ZOFRAN) 8 MG tablet Take 1 tablet (8 mg total) by mouth every 8 (eight) hours as needed for nausea or vomiting. (Patient not taking: Reported on 09/06/2016) 60 tablet 1  . prochlorperazine (COMPAZINE) 10 MG tablet Take 1 tablet (10 mg total) by mouth every 6 (six) hours as needed for nausea or vomiting. (Patient not taking: Reported on  09/06/2016) 30 tablet 6   No current facility-administered medications for this visit.    Facility-Administered Medications Ordered in Other Visits  Medication Dose Route Frequency Provider Last Rate Last Dose  . alteplase (CATHFLO ACTIVASE) injection 2 mg  2 mg Intracatheter Once Lequita Asal, MD      . heparin lock flush 100 unit/mL  500 Units Intravenous Once Lequita Asal, MD      . sodium chloride flush (NS) 0.9 % injection 10 mL  10 mL Intravenous PRN Lequita Asal, MD   10 mL at 04/29/16 0906  . sodium chloride flush (NS) 0.9 % injection 10 mL  10 mL Intravenous  PRN Lequita Asal, MD   10 mL at 09/06/16 1149    Review of Systems:  GENERAL: Feels lousy. No fevers or sweats. Feels "hot and cold". Weight loss. PERFORMANCE STATUS (ECOG): 1 HEENT: Runny nose due to allergies.  No visual changes, sore throat, mouth sores or tenderness. Declines dental evaluation. Lungs:  Left sided chest discomfort (no change).  Chronic shortness of breath.  No pleuritic chest pain.  Thick phlegm.  No hemoptysis. Cardiac: No chest pain, palpitations, orthopnea, or PND. GI: Poor oral intake.  Unable to tolerate solids.  Nausea and vomiting.  No constipation, melena or hematochezia. GU: No urgency, frequency, dysuria, or hematuria. Musculoskeletal: Left sided musculoskeletal pain.  Lumbar back pain, improved with Tramadol (does not take at work). No muscle tenderness. Extremities: Chronic mild left lower extremity edema. Wears Ted hose.  No pain. Skin: No rashes or skin changes. Neuro: Short term memory issues.  No headache, numbness or weakness, balance or coordination issues. Endocrine: No diabetes, thyroid issues, hot flashes or night sweats. Psych: No mood changes, depression or anxiety. Pain: Left lower rib rage discomfort.  Hip pain which at times shoots down right leg. Review of systems: All other systems reviewed and found to be negative  Physical Exam: Blood  pressure 106/71, pulse (!) 125, temperature (!) 96.6 F (35.9 C), temperature source Tympanic, weight 93 lb 0.6 oz (42.2 kg), last menstrual period 04/14/2004. GENERAL: Cachectic woman sitting comfortably in the exam room in no acute distress.  MENTAL STATUS: Alert and oriented to person, place and time. HEAD: Short gray hair.  Normocephalic, atraumatic, face symmetric, no Cushingoid features. EYES: Blue eyes. Pupils equal round and reactive to light and accomodation. No conjunctivitis or scleral icterus. ENT: Oropharynx clear without lesion. Tongue normal. Mucous membranes moist.  RESPIRATORY: Clear to auscultation without rales, wheezes or rhonchi. CARDIOVASCULAR: Regular rate and rhythm without murmur, rub or gallop. ABDOMEN: Soft, non-tender, with active bowel sounds, and no hepatosplenomegaly. No masses. SKIN: No rashes, ulcers or lesions. EXTREMITIES:  No edema, skin discoloration or tenderness. No palpable cords. LYMPH NODES: No palpable cervical, supraclavicular, axillary or inguinal adenopathy  NEUROLOGICAL: Appropriate. PSYCH:  Appropriate.   Infusion on 09/06/2016  Component Date Value Ref Range Status  . WBC 09/06/2016 25.0* 3.6 - 11.0 K/uL Final  . RBC 09/06/2016 2.02* 3.80 - 5.20 MIL/uL Final  . Hemoglobin 09/06/2016 6.4* 12.0 - 16.0 g/dL Final  . HCT 09/06/2016 19.8* 35.0 - 47.0 % Final  . MCV 09/06/2016 98.2  80.0 - 100.0 fL Final  . MCH 09/06/2016 31.6  26.0 - 34.0 pg Final  . MCHC 09/06/2016 32.2  32.0 - 36.0 g/dL Final  . RDW 09/06/2016 18.9* 11.5 - 14.5 % Final  . Platelets 09/06/2016 87* 150 - 440 K/uL Final  . Neutrophils Relative % 09/06/2016 93  % Final  . Neutro Abs 09/06/2016 23.3* 1.4 - 6.5 K/uL Final  . Lymphocytes Relative 09/06/2016 1  % Final  . Lymphs Abs 09/06/2016 0.1* 1.0 - 3.6 K/uL Final  . Monocytes Relative 09/06/2016 6  % Final  . Monocytes Absolute 09/06/2016 1.6* 0.2 - 0.9 K/uL Final  . Eosinophils Relative 09/06/2016 0  % Final   . Eosinophils Absolute 09/06/2016 0.0  0 - 0.7 K/uL Final  . Basophils Relative 09/06/2016 0  % Final  . Basophils Absolute 09/06/2016 0.0  0 - 0.1 K/uL Final  . Sodium 09/06/2016 131* 135 - 145 mmol/L Final  . Potassium 09/06/2016 2.8* 3.5 - 5.1 mmol/L Final  .  Chloride 09/06/2016 98* 101 - 111 mmol/L Final  . CO2 09/06/2016 19* 22 - 32 mmol/L Final  . Glucose, Bld 09/06/2016 177* 65 - 99 mg/dL Final  . BUN 09/06/2016 11  6 - 20 mg/dL Final  . Creatinine, Ser 09/06/2016 0.95  0.44 - 1.00 mg/dL Final  . Calcium 09/06/2016 8.9  8.9 - 10.3 mg/dL Final  . GFR calc non Af Amer 09/06/2016 >60  >60 mL/min Final  . GFR calc Af Amer 09/06/2016 >60  >60 mL/min Final   Comment: (NOTE) The eGFR has been calculated using the CKD EPI equation. This calculation has not been validated in all clinical situations. eGFR's persistently <60 mL/min signify possible Chronic Kidney Disease.   . Anion gap 09/06/2016 14  5 - 15 Final    Assessment:  Christina Mckee is a 55 y.o. female with metastatic squamous cell lung cancer. Molecular studies were negative studies for EGFR, ALK, and ROS1. PD-L1 was 1%.  Chest CT angiogram on 03/05/2016 revealed 8.7 x 5.7 x 3.3 cm mass in the medial aspect of the superior segment of the left lower lobe extending into the medial aspect of the left upper lobe and into the left hilum. There was vascular and endobronchial encasement and narrowing. There were multiple small left lung nodules. Abdomen and pelvic CT scan revealed diffuse hepatic steatosis and no evidence of metastatic disease in the abdomen or pelvis.  CT guided left lower lobe biopsy on 03/29/2016 revealed moderately differentiated squamous cell carcinoma. CEA and LDH were normal on 04/05/2016.  PET scan on 04/14/2016 revealed hypermetabolic left lower lobe mass with hypermetabolic left hilar adenopathy and left hepatic lobe lesion. Findings were consistent with stage IV primary bronchogenic carcinoma. There  were mildly hypermetabolic subcarinal lymph node versus mild esophageal uptake. There was hepatic steatosis. Head CT on 04/15/2016 was normal.  CEA (2.6) and LDH (128) were normal on 04/05/2016.  She has a history of an extensive left lower extremity thromboses in 12/2012. Because of the extensive clot burden, she underwent TPA with angioplasty on 12/28/2012. An IVC filter was placed on 12/29/2012 and still remains. Hypercoagulable workup was negative for Factor V Leiden, prothrombin gene mutation, lupus anticoagulant, and antithrombin III. She was on Xarelto for 6 months.  She declined low dose Eliquis secondary to cost.  She received 3 cycles of carboplatin and Abraxane (04/30/2016 - 06/17/2016).  Chest, abdomen, and pelvic CT scan on 07/05/2016 revealed a mixed response.  There was response to therapy of dominant left lower lobe lung mass (5.7 x 3.3 cm to 5.5 x 2.3 cm).  Liver metastasis was likely progressive (3.4 x 2 cm).  There was development of multiple bilateral renal lesions.  There was new anterior L3 lucent and sclerotic lesion.  Head MRI on 07/24/2016 revealed no evidence of metastatic disease.  Lumbar spine MRI on 07/31/2016 revealed multiple vertebral body bone lesions most consistent with metastatic disease.  There was no evidence of epidural extension.  There was a small left paracentral disc protrusion at T12-L1. There was no evidence of nerve root impingement.  She began monthly Xgeva on 07/15/2016.  She has received 2 cycles of nivolumab + ipilumumab on SWOG S1400 (08/02/2016 - 08/16/2016).  She completed palliative radiation (began 08/11/2016).  Symptomatically, continue to lose weight secondary to chronic nausea and vomiting. She has progressive anemia.  She has persistent hypokalemia.  She remains concerned about missing work.  Plan: 1.  Labs today:  CBC with diff, CMP, Mg. 2.  No treatment  today. 3.  Discuss concerns for ongoing weight loss, malnutrition, and inability to  eat any significant amount of food despite multiple outpatient attempts. Discuss various trials of anti-emetics (unsuccessful as many are ineffective or make her sleepy).  Discuss potential gastric emptying study.  Discuss GI evaluation. 4.  Discuss severe anemia.  Discuss consideration of transfusion.  Patient notes ongoing time constraints with work.  Discuss consideration of admission to hospital this week or weekend. 5.  Discuss cavitating lesion in chest.  Discuss evaluation by thoracic surgery.  Possible source of elevated white blood cell count. 6.  Discuss follow-up with Elease Etienne, social work, regarding financial assistance (pay bills and groceries so patient can be off of work).  7.  IVF with potassium today.  Patient only agreeable to an hour infusion. 8.  After an extended conversation, patient agreeable to admission tomorrow.  She notes that she has to arrange care for her dogs.  She will be contacted tomorrow once a bed is available.  Addendum:  Chest CT today reveals recurrence / progression of lung cancer with increased nodular and masslike consolidation in the LEFT upper lobe and LEFT lower lobe.  There are several new small nodules in the RIGHT middle lobe.  There is progression of hepatic metastasis.  Renal metastatic lesions are not well demonstrated.   Lequita Asal, MD  09/06/2016, 12:43 PM

## 2016-09-06 NOTE — Telephone Encounter (Signed)
Pt needs a note that states she is going to be out of work this week at lest 3-4 days and she will be admitted to hospital.  We will call pt in am to let her know the bed she will go to.  I have faxed the letter to her boss stacey at [phone].

## 2016-09-06 NOTE — Progress Notes (Signed)
The patient returns to clinic today for follow up and evaluation. She is currently on hold for treatment under the clinical research study S1400I. Today, her labs show a grade 3 hypokalemia with a postassium level of 2.8, grade 3 weight loss with an additional weight loss of 5 pounds since 08/16/2016 and a 9.7% total weight since baseline, grade 1 hyponatremia with sodium level 131, elevated WBC of 25.0, grade 3 anemia with hemoglobin of 6.4,  Platelets are decreased and grade 1 at 87,000. The patient reports that she still continues to vomit everything she eats after 11am. She states her diet is limited to whole grains such as whole wheat, cream of wheat, frozen grapes and raisin bran cereal.  Dr. Mike Gip discussed treatment options with the patient and the patient agreed after much discussion, to be admitted to the hospital on 09/07/2016 for IV potassium, nutritional supplement, and blood transfusion. She would only agree to a one hour potassium and hydration infusion this afternoon. She was very concerned about her animals and who would care for them during her hospitalization. The patient's daughter called while she was in the office and arrangements were made for her daughter to care for her animals during the hospitalization. The patient states that she continues to have nausea and vomiting, general weakness, itching of her back and shoulders and some shortness of breath. She reports using OTC cream for the itching and states it doesn't give much relief. Discussion ensued between the patient and Dr. Mike Gip regarding her bills and her inability to pay all of her bills.The patient states, "If I don't work my bills don't get paid". Another referral was made to [facility], PSN, and he provided envelopes for the patient to return unpaid bills for assistance which the patient was in agreement. The patient was also offered food from the cancer center's pantry which she stated she would accept. The patient  completed the EQ-5D Questionnaire and PRO Questionnaire. Dr. Mike Gip made the decision to hold the patient's Nivolumab treatment for today due to symptomatic deterioration.  AE's and attributions are listed below:   Addendum: Met with Dr. Mike Gip @ 4:15pm to discuss the results of the CT scan performed today. The scan shows recurrence/progression of lung cancer with increased nodular and mass like consolidation in the left upper and lower lung lobes. Several new small nodules in the right middle lobe and progression of hepatic metastasis. CT scan of abdomen was not performed therefore unable to evaluate metastatic kidney lesions. Discussion with Dr. Mike Gip,  as the patient also has symptomatic deterioration and will likely be removed from the study. Dr. Mike Gip states she will discuss disease progression with the patient tomorrow during the hospitalization.  Mirian Mo, RN, BSN 09/06/2016 4:30PM  Addendum: 09/07/2016 Email received from S1400I research study. Confirmed from study data coordinator, based on symptom deterioration and per S1400I protocol section 7.3b, the patient meets criterion for removal from protocol treatment.  Mirian Mo, RN, BSN 09/07/2016 3:33 PM         Study/Protocol: SWOG S1400I Cycle: week 5  Event Grade Onset Date Resolved Date Drug Nivolumab Ipilimumab Attribution Treatment Comments  Hyponatremia 1 07/29/16 ongoing  unrelated  IV fluids  Hypokalemia 3 09/01/16 ongoing  unrelated  Oral potassium, pt. Refused IV potassium  Weight loss 3 09/06/16 ongoing  unrelated  Megace Rx  for 20 lb weight loss in <5 mos,   Elevated WBC No grade 07/29/16 ongoing  unrelated  WBC 25,000 on 09/06/2016  Back Pain 2 baseline  ongoing  unrelated Palliative XRT stopped as of 08/30/2016 Patient still able to work and perform ADLs with difficulty, refuses narcotic analgesics   Hypocalcemia 2 09/01/16 09/06/16  unrelated  Resolved, Ca=8.9  Hyperglycemia 2 09/06/16  ongoing  unrelated  Not fasting  Hypoalbuminemia 2 07/29/16 ongoing  unrelated    Nausea 3 08/02/16 ongoing Ipilimumab unlikely  Per patient report  Vomiting 3 08/02/16 ongoing Ipilimumab unlikely  Pt. Reports vomiting everything except frozen grapes cream of wheat & broth   Anemia 3 09/01/16 ongoing  unrelated  To begin Procrit injections 08/23/2016  Sinus tachycardia 1 baseline   unrelated  heartrate 125 this AM: sinus tachycardia per ECG   Proteinuria 2 08/30/16 Not assessed on 09/06/2016   unrelated  Not a 24 hour urine sample/random u/a, U/A not repeated to validate  Thrombocytopenia 1 09/01/16   unrelated    Pruritus 1 09/06/16   unrelated  Patient reports using OTC creams  Mirian Mo, RN, BSN 09/06/2016 10:39 AM

## 2016-09-06 NOTE — Telephone Encounter (Addendum)
Re:  Abnormal labs  I contacted the patient on 09/01/2016 regarding a multitude of abnormal labs.    She was having trouble getting blood from her port. Initial CBC today revealed a hematocrit of 18, hemoglobin 5.9, platelets 95,000, white count 16,400 with an ANC of 10,500.  Given the dramatic difference from her CBC on 08/30/2016, this was felt to be an error.  Repeat counts revealed a hematocrit of 20.8, hemoglobin 6.6, platelets 121,000, white count 15,800 with an ANC of 13,800.  I discussed that she is in the range for transfusion. This likely accounts for dizziness that she has reported. She adamantly declined a transfusion. She stated that she was not coming back any more this week to the clinic. She had to work.  Initial comprehensive metabolic panel today revealed a sodium of 131, potassium 2.7, and calcium 6.6.  Repeat labs revealed a sodium of 132, potassium 2.9, bicarbonate 17, calcium 6.8 and albumin 2.3.  She stated that she had missed some of her potassium doses. She is not taking the calcium pills as they were 2 big.  She declined IV potassium. She stated that she would take her oral potassium. I encouraged her to take calcium in the form of Tums chewables if possible.  We discussed her nausea. She is still is taking very little by mouth. I encouraged her to take Reglan 5 mg 30 minutes to an hour prior to her eating axis, lunch and dinner (every 6 hours as needed).  I am not increasing her dose to 10 mg given her small size and risk for dystonic reactions. She adamantly refused to see her eye doctor to obtain clearance for a scopolamine patch as she does not to go back to him.  I discussed her recent chest x-ray which shows cavitation in the lung mass. I discussed this with Dr. Genevive Bi, cardiothoracic surgeon. This space may be infected. He recommended a chest CT.  She was agreeable to a chest CT if everything could be performed in one day when she comes to clinic (treatment, fluids,  transfusion, chest CT etc).  She again reiterated that she will not be coming back to clinic this week for re-evaluation or treatment.     Lequita Asal, MD

## 2016-09-06 NOTE — Progress Notes (Signed)
Eating still is her main issue due to vomiting. She has phlegm every day and she states it is thick and frothy. She eats and keeps it down til around 11 am and then after that she starts throwing up everything she eats and drinks.going to bathroom with urine and bowels and no diarrhea. She feels weak from not being able to eat.

## 2016-09-07 ENCOUNTER — Inpatient Hospital Stay
Admission: AD | Admit: 2016-09-07 | Discharge: 2016-09-09 | DRG: 177 | Disposition: A | Discharge status: Home / Self Care | Payer: BLUE CROSS/BLUE SHIELD | Source: Ambulatory Visit | Attending: Internal Medicine | Admitting: Internal Medicine

## 2016-09-07 ENCOUNTER — Telehealth: Payer: Self-pay | Admitting: *Deleted

## 2016-09-07 DIAGNOSIS — C3432 Malignant neoplasm of lower lobe, left bronchus or lung: Secondary | ICD-10-CM | POA: Diagnosis not present

## 2016-09-07 DIAGNOSIS — K219 Gastro-esophageal reflux disease without esophagitis: Secondary | ICD-10-CM | POA: Diagnosis present

## 2016-09-07 DIAGNOSIS — D638 Anemia in other chronic diseases classified elsewhere: Secondary | ICD-10-CM | POA: Diagnosis present

## 2016-09-07 DIAGNOSIS — Z515 Encounter for palliative care: Secondary | ICD-10-CM | POA: Diagnosis not present

## 2016-09-07 DIAGNOSIS — C3490 Malignant neoplasm of unspecified part of unspecified bronchus or lung: Secondary | ICD-10-CM

## 2016-09-07 DIAGNOSIS — F17210 Nicotine dependence, cigarettes, uncomplicated: Secondary | ICD-10-CM | POA: Diagnosis present

## 2016-09-07 DIAGNOSIS — E43 Unspecified severe protein-calorie malnutrition: Secondary | ICD-10-CM | POA: Diagnosis present

## 2016-09-07 DIAGNOSIS — R112 Nausea with vomiting, unspecified: Secondary | ICD-10-CM | POA: Diagnosis present

## 2016-09-07 DIAGNOSIS — Z79899 Other long term (current) drug therapy: Secondary | ICD-10-CM | POA: Diagnosis not present

## 2016-09-07 DIAGNOSIS — C7951 Secondary malignant neoplasm of bone: Secondary | ICD-10-CM | POA: Diagnosis present

## 2016-09-07 DIAGNOSIS — K760 Fatty (change of) liver, not elsewhere classified: Secondary | ICD-10-CM | POA: Diagnosis present

## 2016-09-07 DIAGNOSIS — R531 Weakness: Secondary | ICD-10-CM

## 2016-09-07 DIAGNOSIS — Z880 Allergy status to penicillin: Secondary | ICD-10-CM

## 2016-09-07 DIAGNOSIS — Z86718 Personal history of other venous thrombosis and embolism: Secondary | ICD-10-CM | POA: Diagnosis not present

## 2016-09-07 DIAGNOSIS — C3412 Malignant neoplasm of upper lobe, left bronchus or lung: Secondary | ICD-10-CM | POA: Diagnosis present

## 2016-09-07 DIAGNOSIS — G893 Neoplasm related pain (acute) (chronic): Secondary | ICD-10-CM | POA: Diagnosis not present

## 2016-09-07 DIAGNOSIS — J851 Abscess of lung with pneumonia: Principal | ICD-10-CM | POA: Diagnosis present

## 2016-09-07 DIAGNOSIS — R64 Cachexia: Secondary | ICD-10-CM | POA: Diagnosis present

## 2016-09-07 DIAGNOSIS — A419 Sepsis, unspecified organism: Secondary | ICD-10-CM | POA: Diagnosis present

## 2016-09-07 DIAGNOSIS — E876 Hypokalemia: Secondary | ICD-10-CM | POA: Diagnosis present

## 2016-09-07 DIAGNOSIS — E871 Hypo-osmolality and hyponatremia: Secondary | ICD-10-CM | POA: Diagnosis present

## 2016-09-07 DIAGNOSIS — E872 Acidosis: Secondary | ICD-10-CM | POA: Diagnosis present

## 2016-09-07 DIAGNOSIS — R630 Anorexia: Secondary | ICD-10-CM

## 2016-09-07 DIAGNOSIS — Z923 Personal history of irradiation: Secondary | ICD-10-CM

## 2016-09-07 DIAGNOSIS — Z681 Body mass index (BMI) 19 or less, adult: Secondary | ICD-10-CM

## 2016-09-07 DIAGNOSIS — Z7189 Other specified counseling: Secondary | ICD-10-CM | POA: Diagnosis not present

## 2016-09-07 DIAGNOSIS — R627 Adult failure to thrive: Secondary | ICD-10-CM | POA: Diagnosis present

## 2016-09-07 DIAGNOSIS — Z9221 Personal history of antineoplastic chemotherapy: Secondary | ICD-10-CM | POA: Diagnosis not present

## 2016-09-07 DIAGNOSIS — C787 Secondary malignant neoplasm of liver and intrahepatic bile duct: Secondary | ICD-10-CM | POA: Diagnosis not present

## 2016-09-07 LAB — URINALYSIS COMPLETE WITH MICROSCOPIC (ARMC ONLY)
BACTERIA UA: NONE SEEN
Bilirubin Urine: NEGATIVE
GLUCOSE, UA: NEGATIVE mg/dL
HGB URINE DIPSTICK: NEGATIVE
Ketones, ur: NEGATIVE mg/dL
LEUKOCYTES UA: NEGATIVE
Nitrite: NEGATIVE
PH: 6 (ref 5.0–8.0)
Protein, ur: 30 mg/dL — AB
RBC / HPF: NONE SEEN RBC/hpf (ref 0–5)
SQUAMOUS EPITHELIAL / LPF: NONE SEEN
Specific Gravity, Urine: 1.011 (ref 1.005–1.030)

## 2016-09-07 LAB — COMPREHENSIVE METABOLIC PANEL
ALBUMIN: 2.2 g/dL — AB (ref 3.5–5.0)
ALK PHOS: 135 U/L — AB (ref 38–126)
ALT: 21 U/L (ref 14–54)
ANION GAP: 13 (ref 5–15)
AST: 30 U/L (ref 15–41)
BUN: 10 mg/dL (ref 6–20)
CALCIUM: 8.6 mg/dL — AB (ref 8.9–10.3)
CHLORIDE: 99 mmol/L — AB (ref 101–111)
CO2: 20 mmol/L — AB (ref 22–32)
Creatinine, Ser: 0.77 mg/dL (ref 0.44–1.00)
GFR calc Af Amer: >60 mL/min (ref >60)
GFR calc non Af Amer: >60 mL/min (ref >60)
GLUCOSE: 139 mg/dL — AB (ref 65–99)
Potassium: 3 mmol/L — ABNORMAL LOW (ref 3.5–5.1)
SODIUM: 132 mmol/L — AB (ref 135–145)
Total Bilirubin: 0.5 mg/dL (ref 0.3–1.2)
Total Protein: 5.3 g/dL — ABNORMAL LOW (ref 6.5–8.1)

## 2016-09-07 LAB — CBC WITH DIFFERENTIAL/PLATELET
BASOS PCT: 0 %
Basophils Absolute: 0 10*3/uL (ref 0–0.1)
Eosinophils Absolute: 0.1 10*3/uL (ref 0–0.7)
Eosinophils Relative: 0 %
HEMATOCRIT: 17.9 % — AB (ref 35.0–47.0)
Hemoglobin: 5.8 g/dL — ABNORMAL LOW (ref 12.0–16.0)
LYMPHS ABS: 0.4 10*3/uL — AB (ref 1.0–3.6)
LYMPHS PCT: 2 %
MCH: 31.5 pg (ref 26.0–34.0)
MCHC: 32.2 g/dL (ref 32.0–36.0)
MCV: 97.6 fL (ref 80.0–100.0)
MONO ABS: 1.5 10*3/uL — AB (ref 0.2–0.9)
MONOS PCT: 7 %
NEUTROS ABS: 21.2 10*3/uL — AB (ref 1.4–6.5)
Neutrophils Relative %: 91 %
Platelets: 76 10*3/uL — ABNORMAL LOW (ref 150–440)
RBC: 1.84 MIL/uL — ABNORMAL LOW (ref 3.80–5.20)
RDW: 18.3 % — AB (ref 11.5–14.5)
WBC: 23.2 10*3/uL — ABNORMAL HIGH (ref 3.6–11.0)

## 2016-09-07 LAB — PROTIME-INR
INR: 1.33
PROTHROMBIN TIME: 16.6 s — AB (ref 11.4–15.2)

## 2016-09-07 LAB — LACTIC ACID, PLASMA
Lactic Acid, Venous: 3.6 mmol/L (ref 0.5–1.9)
Lactic Acid, Venous: 1.5 mmol/L (ref 0.5–1.9)

## 2016-09-07 LAB — MAGNESIUM: Magnesium: 1.3 mg/dL — ABNORMAL LOW (ref 1.7–2.4)

## 2016-09-07 LAB — ABO/RH: ABO/RH(D): A POS

## 2016-09-07 LAB — APTT: aPTT: 33 seconds (ref 24–36)

## 2016-09-07 LAB — PREPARE RBC (CROSSMATCH)

## 2016-09-07 LAB — PROCALCITONIN: Procalcitonin: 3.82 ng/mL

## 2016-09-07 MED ORDER — VANCOMYCIN HCL IN DEXTROSE 1-5 GM/200ML-% IV SOLN
1000.0000 mg | Freq: Once | INTRAVENOUS | Status: AC
  Administered 2016-09-07: 22:00:00 1000 mg via INTRAVENOUS
  Filled 2016-09-07: qty 200

## 2016-09-07 MED ORDER — VANCOMYCIN HCL 500 MG IV SOLR: 500.0000 mg | INTRAVENOUS | Status: DC

## 2016-09-07 MED ORDER — BISACODYL 5 MG PO TBEC: 5.0000 mg | DELAYED_RELEASE_TABLET | Freq: Every day | ORAL | Status: DC | PRN

## 2016-09-07 MED ORDER — SENNOSIDES-DOCUSATE SODIUM 8.6-50 MG PO TABS: 1.0000 | ORAL_TABLET | Freq: Every evening | ORAL | Status: DC | PRN

## 2016-09-07 MED ORDER — ACETAMINOPHEN 325 MG PO TABS
650.0000 mg | ORAL_TABLET | Freq: Four times a day (QID) | ORAL | Status: DC | PRN
  Filled 2016-09-07 (×2): qty 2

## 2016-09-07 MED ORDER — LEVOFLOXACIN IN D5W 750 MG/150ML IV SOLN
750.0000 mg | Freq: Once | INTRAVENOUS | Status: AC
  Administered 2016-09-07: 750 mg via INTRAVENOUS
  Filled 2016-09-07: qty 150

## 2016-09-07 MED ORDER — HYDROCODONE-ACETAMINOPHEN 5-325 MG PO TABS: 1.0000 | ORAL_TABLET | ORAL | Status: DC | PRN

## 2016-09-07 MED ORDER — LEVOFLOXACIN IN D5W 500 MG/100ML IV SOLN
500.0000 mg | INTRAVENOUS | Status: DC
  Administered 2016-09-08: 500 mg via INTRAVENOUS
  Filled 2016-09-07 (×2): qty 100

## 2016-09-07 MED ORDER — VANCOMYCIN HCL 500 MG IV SOLR
500.0000 mg | INTRAVENOUS | Status: DC
  Administered 2016-09-08 – 2016-09-09 (×2): 500 mg via INTRAVENOUS
  Filled 2016-09-07 (×4): qty 500

## 2016-09-07 MED ORDER — SODIUM CHLORIDE 0.9 % IV SOLN
Freq: Once | INTRAVENOUS | Status: AC
  Administered 2016-09-07: 17:00:00 via INTRAVENOUS

## 2016-09-07 MED ORDER — DOCUSATE SODIUM 100 MG PO CAPS
100.0000 mg | ORAL_CAPSULE | Freq: Two times a day (BID) | ORAL | Status: DC
  Administered 2016-09-07 – 2016-09-08 (×2): 100 mg via ORAL
  Filled 2016-09-07 (×4): qty 1

## 2016-09-07 MED ORDER — TRAMADOL HCL 50 MG PO TABS
50.0000 mg | ORAL_TABLET | Freq: Four times a day (QID) | ORAL | Status: DC | PRN
  Administered 2016-09-07 – 2016-09-08 (×3): 50 mg via ORAL
  Filled 2016-09-07 (×3): qty 1

## 2016-09-07 MED ORDER — ONDANSETRON HCL 4 MG/2ML IJ SOLN
4.0000 mg | Freq: Four times a day (QID) | INTRAMUSCULAR | Status: DC | PRN
Start: 2016-09-07 — End: 2016-09-09

## 2016-09-07 MED ORDER — MEGESTROL ACETATE 400 MG/10ML PO SUSP
200.0000 mg | Freq: Every day | ORAL | Status: DC
  Filled 2016-09-07 (×3): qty 10

## 2016-09-07 MED ORDER — GUAIFENESIN-CODEINE 100-10 MG/5ML PO SOLN: 5.0000 mL | Freq: Three times a day (TID) | ORAL | Status: DC | PRN

## 2016-09-07 MED ORDER — ACETAMINOPHEN 325 MG PO TABS: 650.0000 mg | ORAL_TABLET | Freq: Once | ORAL | Status: DC

## 2016-09-07 MED ORDER — SODIUM CHLORIDE 0.9% FLUSH
3.0000 mL | Freq: Two times a day (BID) | INTRAVENOUS | Status: DC
  Administered 2016-09-07 – 2016-09-09 (×2): 3 mL via INTRAVENOUS

## 2016-09-07 MED ORDER — ENOXAPARIN SODIUM 40 MG/0.4ML ~~LOC~~ SOLN
40.0000 mg | SUBCUTANEOUS | Status: DC
  Filled 2016-09-07: qty 0.4

## 2016-09-07 MED ORDER — NICOTINE 21 MG/24HR TD PT24
21.0000 mg | MEDICATED_PATCH | Freq: Every day | TRANSDERMAL | Status: DC
  Administered 2016-09-07 – 2016-09-09 (×3): 21 mg via TRANSDERMAL
  Filled 2016-09-07 (×3): qty 1

## 2016-09-07 MED ORDER — ONDANSETRON HCL 4 MG PO TABS
4.0000 mg | ORAL_TABLET | Freq: Four times a day (QID) | ORAL | Status: DC | PRN
  Administered 2016-09-07 – 2016-09-08 (×2): 4 mg via ORAL
  Filled 2016-09-07 (×2): qty 1

## 2016-09-07 MED ORDER — CYCLOBENZAPRINE HCL 10 MG PO TABS
5.0000 mg | ORAL_TABLET | Freq: Three times a day (TID) | ORAL | Status: DC | PRN
  Administered 2016-09-07 – 2016-09-08 (×2): 5 mg via ORAL
  Filled 2016-09-07 (×2): qty 1

## 2016-09-07 MED ORDER — POTASSIUM CHLORIDE 20 MEQ/15ML (10%) PO SOLN
40.0000 meq | Freq: Two times a day (BID) | ORAL | Status: AC
  Administered 2016-09-07: 23:00:00 40 meq via ORAL
  Filled 2016-09-07: qty 30

## 2016-09-07 MED ORDER — ALBUTEROL SULFATE (2.5 MG/3ML) 0.083% IN NEBU
2.5000 mg | INHALATION_SOLUTION | RESPIRATORY_TRACT | Status: DC | PRN
Start: 2016-09-07 — End: 2016-09-09

## 2016-09-07 MED ORDER — POTASSIUM CHLORIDE IN NACL 40-0.9 MEQ/L-% IV SOLN
INTRAVENOUS | Status: DC
Start: 2016-09-07 — End: 2016-09-08
  Administered 2016-09-07: 23:00:00 100 mL/h via INTRAVENOUS
  Filled 2016-09-07 (×3): qty 1000

## 2016-09-07 MED ORDER — ACETAMINOPHEN 650 MG RE SUPP: 650.0000 mg | Freq: Four times a day (QID) | RECTAL | Status: DC | PRN

## 2016-09-07 MED ORDER — LEVOFLOXACIN IN D5W 750 MG/150ML IV SOLN
750.0000 mg | Freq: Once | INTRAVENOUS | Status: DC
  Filled 2016-09-07: qty 150

## 2016-09-07 MED ORDER — FUROSEMIDE 10 MG/ML IJ SOLN
20.0000 mg | Freq: Once | INTRAMUSCULAR | Status: AC
  Administered 2016-09-07: 20 mg via INTRAVENOUS
  Filled 2016-09-07: qty 2

## 2016-09-07 MED ORDER — VANCOMYCIN HCL IN DEXTROSE 1-5 GM/200ML-% IV SOLN
1000.0000 mg | Freq: Once | INTRAVENOUS | Status: DC
  Filled 2016-09-07: qty 200

## 2016-09-07 NOTE — Progress Notes (Signed)
Patient ID: Christina Mckee, female   DOB: December 29, 1960, 55 y.o.   MRN: [id number]  No chief complaint on file.   Referred By Dr. Mike Gip Reason for Referral Lung Cancer  HPI Location, Quality, Duration, Severity, Timing, Context, Modifying Factors, Associated Signs and Symptoms.  Christina Mckee is a 55 y.o. female.  She was originally diagnosed with stage IV bronchogenic carcinoma in April of this year. She is begun on therapy and has progressed throughout. She recently had a CT scan made which revealed an air-fluid level present in the left perihilar region. She's had a rising white blood cell count and the risk some concern that this may represent a abscess. The patient was admitted to the hospital today for multiple medical problems including anemia and hypokalemia. She is currently being treated through a right internal jugular Port-A-Cath. The patient states that she occasionally smokes. She does not endorse any specific amount but states she is trying to quit. She has a chronic cough which is productive of frothy sputum but no blood or color. She states that she gets short of breath with minimal activities. She's had significant weight loss of approximately 10 pounds from about 105 pounds to 95 pounds. She states this is because she has a very poor appetite and also when she does eat she has some nausea and vomiting. I was asked to see the patient for consideration of surgical intervention for her lung cancer and/or her lung abscess.   Past Medical History:  Diagnosis Date  . DVT (deep venous thrombosis) ([place])    in 2015  . Menopause    prior to 2014  . Squamous cell lung cancer [facility])     Past Surgical History:  Procedure Laterality Date  . BREAST CYST EXCISION     left breast removal  . CESAREAN SECTION    . IVC FILTER PLACEMENT ([facility] HX)    . PERIPHERAL VASCULAR CATHETERIZATION N/A 04/28/2016   Procedure: Glori Luis Cath Insertion;  Surgeon: Algernon Huxley, MD;  Location: [place] [facility];  Service: Cardiovascular;  Laterality: N/A;    Family History  Problem Relation Age of Onset  . Breast cancer Mother   . Breast cancer Maternal Aunt   . Stomach cancer Paternal Uncle   . Leukemia Paternal Uncle     Social History Social History  Substance Use Topics  . Smoking status: Current Every Day Smoker    Packs/day: 0.25    Types: Cigarettes  . Smokeless tobacco: Never Used     Comment: 1/2 PPD to 1PPD  . Alcohol use 0.0 oz/week     Comment: once a week 1/2 a beer to 1 beer    Allergies  Allergen Reactions  . Penicillins Hives and Other (See Comments)    Has patient had a PCN reaction causing immediate rash, facial/tongue/throat swelling, SOB or lightheadedness with hypotension: No Has patient had a PCN reaction causing severe rash involving mucus membranes or skin necrosis: No Has patient had a PCN reaction that required hospitalization No Has patient had a PCN reaction occurring within the last 10 years: No If all of the above answers are "NO", then may proceed with Cephalosporin use.    Current Facility-Administered Medications  Medication Dose Route Frequency Provider Last Rate Last Dose  . 0.9 %  sodium chloride infusion   Intravenous Once Demetrios Loll, MD      . 0.9 % NaCl with KCl 40 mEq / L  infusion   Intravenous Continuous Demetrios Loll,  MD      . acetaminophen (TYLENOL) tablet 650 mg  650 mg Oral Q6H PRN Demetrios Loll, MD       Or  . acetaminophen (TYLENOL) suppository 650 mg  650 mg Rectal Q6H PRN Demetrios Loll, MD      . acetaminophen (TYLENOL) tablet 650 mg  650 mg Oral Once Demetrios Loll, MD      . albuterol (PROVENTIL) (2.5 MG/3ML) 0.083% nebulizer solution 2.5 mg  2.5 mg Nebulization Q2H PRN Demetrios Loll, MD      . bisacodyl (DULCOLAX) EC tablet 5 mg  5 mg Oral Daily PRN Demetrios Loll, MD      . docusate sodium (COLACE) capsule 100 mg  100 mg Oral BID Demetrios Loll, MD      . enoxaparin (LOVENOX) injection 40 mg  40 mg Subcutaneous Q24H Demetrios Loll, MD      . furosemide  (LASIX) injection 20 mg  20 mg Intravenous Once Demetrios Loll, MD      . HYDROcodone-acetaminophen (NORCO/VICODIN) 5-325 MG per tablet 1-2 tablet  1-2 tablet Oral Q4H PRN Demetrios Loll, MD      . Derrill Memo ON 09/08/2016] levofloxacin (LEVAQUIN) IVPB 500 mg  500 mg Intravenous Q24H Demetrios Loll, MD      . levofloxacin (LEVAQUIN) IVPB 750 mg  750 mg Intravenous Once Demetrios Loll, MD      . ondansetron [facility]) tablet 4 mg  4 mg Oral Q6H PRN Demetrios Loll, MD       Or  . ondansetron [facility]) injection 4 mg  4 mg Intravenous Q6H PRN Demetrios Loll, MD      . potassium chloride 20 MEQ/15ML (10%) solution 40 mEq  40 mEq Oral BID Demetrios Loll, MD      . senna-docusate (Senokot-S) tablet 1 tablet  1 tablet Oral QHS PRN Demetrios Loll, MD      . sodium chloride flush (NS) 0.9 % injection 3 mL  3 mL Intravenous Q12H Demetrios Loll, MD      . Derrill Memo ON 09/08/2016] vancomycin (VANCOCIN) 500 mg in sodium chloride 0.9 % 100 mL IVPB  500 mg Intravenous Q18H Demetrios Loll, MD      . vancomycin (VANCOCIN) IVPB 1000 mg/200 mL premix  1,000 mg Intravenous Once Demetrios Loll, MD       Facility-Administered Medications Ordered in Other Encounters  Medication Dose Route Frequency Provider Last Rate Last Dose  . alteplase (CATHFLO ACTIVASE) injection 2 mg  2 mg Intracatheter Once Lequita Asal, MD      . sodium chloride 0.9 % 100 mL with potassium chloride 10 mEq infusion   Intravenous Continuous Lequita Asal, MD   Stopped at 09/06/16 1507  . sodium chloride flush (NS) 0.9 % injection 10 mL  10 mL Intravenous PRN Lequita Asal, MD   10 mL at 04/29/16 0906      Review of Systems A complete review of systems was asked and was negative except for the following positive findingsProductive cough, weight loss, shortness of breath, lethargy, but no fever and no hemoptysis  Blood pressure 113/73, pulse (!) 126, temperature 97.5 F (36.4 C), temperature source Oral, resp. rate 16, height '5\' 7"'$  (1.702 m), weight 89 lb 11.2 oz (40.7 kg), last menstrual  period 04/14/2004, SpO2 100 %.  Physical Exam CONSTITUTIONAL:  Pleasant, very thin cachectic appearing and in no acute distress. EYES: Pupils equal and reactive to light, Sclera non-icteric EARS, NOSE, MOUTH AND THROAT:  The oropharynx was clear.  Dentition is good  repair.  Oral mucosa pink and moist. LYMPH NODES:  Lymph nodes in the neck and axillae were normal RESPIRATORY:  Lungs were clear.  Normal respiratory effort without pathologic use of accessory muscles of respiration CARDIOVASCULAR: Heart was regular without murmurs.  There were no carotid bruits. GI: The abdomen was soft, nontender, and nondistended. There were no palpable masses. There was no hepatosplenomegaly. There were normal bowel sounds in all quadrants. GU:  Rectal deferred.   MUSCULOSKELETAL:  Normal muscle strength and tone.  No clubbing or cyanosis.   SKIN:  There were no pathologic skin lesions.  There were no nodules on palpation. NEUROLOGIC:  Sensation is normal.  Cranial nerves are grossly intact. PSYCH:  Oriented to person, place and time.  Mood and affect are normal.  Data Reviewed CT scan  I have personally reviewed the patient's imaging, laboratory findings and medical records.    Assessment    I have independently reviewed the patient's CT scan. Of also reviewed the prior scans. There is been interval progression of the patient's underlying tumor mass between August and October. The air-fluid level may or may not be related to an infectious etiology but may be related to tumor necrosis. At the present time I do not see any evidence for surgical intervention.    Plan    I had a long discussion with the patient. I reviewed with the patient her x-rays. We discussed the findings on the most recent CT scan. At the present time I did not see any indication for surgical resection and/or drainage. I would recommend that she be treated with oral antibiotics if she develops any fever or her clinical picture  worsens.     Nestor Lewandowsky, MD 09/07/2016, 3:40 PM

## 2016-09-07 NOTE — Progress Notes (Addendum)
Pharmacy Antibiotic Note  Christina Mckee is a 55 y.o. female admitted on 09/07/2016 with pneumonia.  Pharmacy has been consulted for Levaquin and Vancomycin dosing.  Plan: Vancomycin 500 IV every 18 hours.  Goal trough 15-20 mcg/mL. Levaquin '500mg'$  IV q24h  Height: '5\' 7"'$  (170.2 cm) Weight: 89 lb 11.2 oz (40.7 kg) IBW/kg (Calculated) : 61.6  Temp (24hrs), Avg:97.5 F (36.4 C), Min:97.5 F (36.4 C), Max:97.5 F (36.4 C)   Recent Labs Lab 09/01/16 1318 09/01/16 1601 09/06/16 1149 09/07/16 1414  WBC 16.4* 15.8* 25.0* 23.2*  CREATININE 0.51 0.46 0.95 0.77  LATICACIDVEN 1.0  --   --  3.6*    Estimated Creatinine Clearance: 49.9 mL/min (by C-G formula based on SCr of 0.77 mg/dL).    Allergies  Allergen Reactions  . Penicillins Hives and Other (See Comments)    Has patient had a PCN reaction causing immediate rash, facial/tongue/throat swelling, SOB or lightheadedness with hypotension: No Has patient had a PCN reaction causing severe rash involving mucus membranes or skin necrosis: No Has patient had a PCN reaction that required hospitalization No Has patient had a PCN reaction occurring within the last 10 years: No If all of the above answers are "NO", then may proceed with Cephalosporin use.    Antimicrobials this admission: Levaquin 10/17 >>  Vancomycin 10/17 >>   Dose adjustments this admission:  Microbiology results:  Thank you for allowing pharmacy to be a part of this patient's care.  Paulina Fusi, PharmD, BCPS 09/07/2016 3:22 PM   Addendum: 10/17 @ 1800 RN called and reported that antibiotics scheduled for this afternoon were not able to be given due to patient receiving blood transfusion and requested antibiotics be retimed for 2100 when transfusion complete.  Vancomycin trough will now be scheduled for 10/20 at [place] 09/07/16 6:13 PM

## 2016-09-07 NOTE — Consult Note (Signed)
Christina Lame, MD [address] [place] Mc[place], [place] [address] Phone: [phone] Fax : [phone]  Consultation  Referring Provider:     No ref. provider found Primary Care Physician:  No PCP Per Patient Primary Gastroenterologist:  None         Reason for Consultation:     Nausea and vomiting  Date of Admission:  09/07/2016 Date of Consultation:  09/07/2016         HPI:   Christina Mckee is a 55 y.o. female who has metastatic squamous cell lung cancer who has been treated with chemotherapy and reports that she has had nausea since being diagnosed. The patient reports that she was treated with Phenergan which made her very sleepy and she would take it at night. The patient also reports that she wakes up in the morning with a lot of phlegm and coughing which causes her to vomit. The patient states that she will eat and usually vomit 3-4 hours after eating. She also reports that she has gone down in weight from 120 pounds down to 97 pounds. The patient has also been seen by thoracic surgery for a cavitating lesion in her chest. She states that surgery would not be recommended for her.  Past Medical History:  Diagnosis Date  . DVT (deep venous thrombosis) ([place])    in 2015  . Menopause    prior to 2014  . Squamous cell lung cancer [facility])     Past Surgical History:  Procedure Laterality Date  . BREAST CYST EXCISION     left breast removal  . CESAREAN SECTION    . IVC FILTER PLACEMENT ([facility] HX)    . PERIPHERAL VASCULAR CATHETERIZATION N/A 04/28/2016   Procedure: Glori Luis Cath Insertion;  Surgeon: Algernon Huxley, MD;  Location: [place] [facility];  Service: Cardiovascular;  Laterality: N/A;    Prior to Admission medications   Medication Sig Start Date End Date Taking? Authorizing Provider  cyclobenzaprine (FLEXERIL) 5 MG tablet Take 1 tablet (5 mg total) by mouth 3 (three) times daily as needed for muscle spasms. 09/03/16  Yes Lequita Asal, MD  guaifenesin (ROBITUSSIN)  100 MG/5ML syrup Take 200 mg by mouth 3 (three) times daily as needed for cough or congestion.   Yes Historical Provider, MD  ibuprofen (ADVIL,MOTRIN) 200 MG tablet Take by mouth every 6 (six) hours as needed.    Yes Historical Provider, MD  KLOR-CON 10 10 MEQ tablet TAKE 3 TABS BY MOUTH IN THE MORNING AND 2 TABS BY MOUTH IN THE EVENING 08/03/16  Yes Lequita Asal, MD  lidocaine-prilocaine (EMLA) cream Apply 1 application topically as needed. 04/22/16  Yes Cammie Sickle, MD  megestrol (MEGACE) 400 MG/10ML suspension Take 5 mLs (200 mg total) by mouth daily. 07/29/16  Yes Lequita Asal, MD  metoCLOPramide (REGLAN) 5 MG tablet Take 1 tablet (5 mg total) by mouth every 8 (eight) hours as needed for nausea. 08/16/16  Yes Lequita Asal, MD  Multiple Vitamin (MULTIVITAMIN) tablet Take 1 tablet by mouth daily.   Yes Historical Provider, MD  nicotine (NICODERM CQ - DOSED IN MG/24 HOURS) 21 mg/24hr patch Place 1 patch (21 mg total) onto the skin daily. 04/16/16  Yes Lequita Asal, MD  traMADol (ULTRAM) 50 MG tablet Take 1 tablet (50 mg total) by mouth every 6 (six) hours as needed. 08/05/16  Yes Lequita Asal, MD  GuaiFENesin (MUCINEX PO) Take by mouth.    Historical Provider, MD  guaiFENesin-codeine (CHERATUSSIN AC) 100-10 MG/5ML syrup Take 5 mLs by mouth 3 (three) times daily as needed for cough. Patient not taking: Reported on 09/07/2016 05/12/16   Lequita Asal, MD  levofloxacin (LEVAQUIN) 500 MG tablet Take 1 tablet (500 mg total) by mouth daily. Patient not taking: Reported on 09/07/2016 08/30/16   Lequita Asal, MD  ondansetron (ZOFRAN) 8 MG tablet Take 1 tablet (8 mg total) by mouth every 8 (eight) hours as needed for nausea or vomiting. Patient not taking: Reported on 09/07/2016 08/05/16   Lequita Asal, MD  prochlorperazine (COMPAZINE) 10 MG tablet Take 1 tablet (10 mg total) by mouth every 6 (six) hours as needed for nausea or vomiting. Patient not taking:  Reported on 09/07/2016 04/22/16   Cammie Sickle, MD    Family History  Problem Relation Age of Onset  . Breast cancer Mother   . Breast cancer Maternal Aunt   . Stomach cancer Paternal Uncle   . Leukemia Paternal Uncle      Social History  Substance Use Topics  . Smoking status: Current Every Day Smoker    Packs/day: 0.25    Types: Cigarettes  . Smokeless tobacco: Never Used     Comment: 1/2 PPD to 1PPD  . Alcohol use 0.0 oz/week     Comment: once a week 1/2 a beer to 1 beer    Allergies as of 09/07/2016 - Review Complete 09/07/2016  Allergen Reaction Noted  . Penicillins Hives and Other (See Comments) 03/05/2016    Review of Systems:    All systems reviewed and negative except where noted in HPI.   Physical Exam:  Vital signs in last 24 hours: Temp:  [97.5 F (36.4 C)-98 F (36.7 C)] 98 F (36.7 C) (10/17 1658) Pulse Rate:  [88-126] 106 (10/17 1658) Resp:  [16] 16 (10/17 1658) BP: (100-113)/(65-73) 100/65 (10/17 1658) SpO2:  [100 %] 100 % (10/17 1326) Weight:  [89 lb 11.2 oz (40.7 kg)] 89 lb 11.2 oz (40.7 kg) (10/17 1326)   General:   Pleasant, cooperative in NAD Head:  Normocephalic and atraumatic. Eyes:   No icterus.   Conjunctiva pale. PERRLA. Ears:  Normal auditory acuity. Neck:  Supple; no masses or thyroidomegaly Lungs: Respirations even and unlabored. Lungs sounds decreased on the left.   No wheezes, crackles, or rhonchi.  Heart:  Regular rate and rhythm;  Without murmur, clicks, rubs or gallops Abdomen:  Soft, nondistended, diffusely tender. Normal bowel sounds. No appreciable masses or hepatomegaly.  No rebound or guarding.  Rectal:  Not performed. Msk:  Symmetrical without gross deformities.    Extremities:  Without edema, cyanosis or clubbing. Neurologic:  Alert and oriented x3;  grossly normal neurologically. Skin:  Intact without significant lesions or rashes. Psych:  Alert and cooperative. Normal affect.  LAB RESULTS:  Recent Labs   09/06/16 1149 09/07/16 1414  WBC 25.0* 23.2*  HGB 6.4* 5.8*  HCT 19.8* 17.9*  PLT 87* 76*   BMET  Recent Labs  09/06/16 1149 09/07/16 1414  NA 131* 132*  K 2.8* 3.0*  CL 98* 99*  CO2 19* 20*  GLUCOSE 177* 139*  BUN 11 10  CREATININE 0.95 0.77  CALCIUM 8.9 8.6*   LFT  Recent Labs  09/07/16 1414  PROT 5.3*  ALBUMIN 2.2*  AST 30  ALT 21  ALKPHOS 135*  BILITOT 0.5   PT/INR  Recent Labs  09/07/16 1414  LABPROT 16.6*  INR 1.33    STUDIES: Ct Chest Wo Contrast  Result Date: 09/06/2016 CLINICAL DATA:  Squamous cell carcinoma LEFT lung. EXAM: CT CHEST WITHOUT CONTRAST TECHNIQUE: Multidetector CT imaging of the chest was performed following the standard protocol without IV contrast. COMPARISON:  CT 07/05/2016, PET-CT 04/14/2016 FINDINGS: Cardiovascular: No significant vascular findings. Normal heart size. No pericardial effusion. Mediastinum/Nodes: No axillary supraclavicular adenopathy. No adenopathy identified on this noncontrast exam. Lungs/Pleura: There is interval increase in nodularity along the superior aspect of the cavitary lesion LEFT upper lobe. For example 2.9 by 2.6 cm nodular mass is increased from 2.2 x 1.0 cm (image 45, series 3). Increased nodular consolidation within the LEFT lower lobe with 4.1 by 3.1 cm mass increased from 2.6 by 2.0 cm nodule (image 70, series 3). Several small sub cm nodules in the RIGHT middle lobe (Image 81 and 87 of series 3) not seen on prior. Nodule pleural parenchymal thickening at the RIGHT lung base (image 122, series 3 is similar). Upper Abdomen: Metastasis are not well demonstrated on this noncontrast exam. There is suggestive of progression of the LEFT hepatic lobe lesion measuring 6.5 cm increased from 3.4 cm. Renal metastasis not well demonstrated Musculoskeletal: No aggressive osseous lesion. IMPRESSION: 1. Recurrence / progression of lung cancer with increased nodular and masslike consolidation in the LEFT upper lobe and LEFT  lower lobe. 2. Several new small nodules in the RIGHT middle lobe. 3. Progression of hepatic metastasis. 4. Renal metastatic lesions not well demonstrated. Electronically Signed   By: Suzy Bouchard M.D.   On: 09/06/2016 13:54      Impression / Plan:   BRIANNY SOULLIERE is a 55 y.o. y/o female with chronic nausea and a history of metastatic lung cancer. The patient has been having symptoms typically 3-4 hours after she eats. The patient is not a good candidate for any upper endoscopy at this time with her weight loss and respiratory issues. The patient will be set up for a upper GI series to see if there is any pathological blockage of her stomach such as gastric outlet obstruction. If this is positive then the patient may need to undergo a upper endoscopy. The patient has been explained the plan and agrees with it.  Thank you for involving me in the care of this patient.      LOS: 0 days   Christina Lame, MD  09/07/2016, 5:36 PM   Note: This dictation was prepared with Dragon dictation along with smaller phrase technology. Any transcriptional errors that result from this process are unintentional.

## 2016-09-07 NOTE — H&P (Addendum)
[facility] NAME: Christina Mckee    MR#:  [id number]  DATE OF BIRTH:  October 31, 1961  DATE OF ADMISSION:  09/07/2016  PRIMARY CARE PHYSICIAN: No PCP Per Patient   REQUESTING/REFERRING PHYSICIAN: Lequita Asal, MD  CHIEF COMPLAINT:  No chief complaint on file.   Nausea, vomiting and generalized weakness for 1 month HISTORY OF PRESENT ILLNESS:  Christina Mckee  is a 55 y.o. female with a known history of Lung cancer with metastasis and DVT. The patient is sent by Dr. Mike Gip for direct admission. She complained of chills, nausea, vomiting, weight loss, poor oral intake and generalized weakness for the past one month. She underwent workup, which show WBC 25, hemoglobin 6.4, potassium 2.8 and sodium 131. In addition, chest x-ray shows Large cavitary mass with air-fluid level.  PAST MEDICAL HISTORY:   Past Medical History:  Diagnosis Date  . DVT (deep venous thrombosis) ([place])    in 2015  . Menopause    prior to 2014  . Squamous cell lung cancer ([place])     PAST SURGICAL HISTORY:   Past Surgical History:  Procedure Laterality Date  . BREAST CYST EXCISION     left breast removal  . CESAREAN SECTION    . IVC FILTER PLACEMENT ([facility] HX)    . PERIPHERAL VASCULAR CATHETERIZATION N/A 04/28/2016   Procedure: Glori Luis Cath Insertion;  Surgeon: Algernon Huxley, MD;  Location: [facility];  Service: Cardiovascular;  Laterality: N/A;    SOCIAL HISTORY:   Social History  Substance Use Topics  . Smoking status: Current Every Day Smoker    Packs/day: 0.25    Types: Cigarettes  . Smokeless tobacco: Never Used     Comment: 1/2 PPD to 1PPD  . Alcohol use 0.0 oz/week     Comment: once a week 1/2 a beer to 1 beer    FAMILY HISTORY:   Family History  Problem Relation Age of Onset  . Breast cancer Mother   . Breast cancer Maternal Aunt   . Stomach cancer Paternal Uncle   . Leukemia Paternal Uncle     DRUG ALLERGIES:   Allergies    Allergen Reactions  . Penicillins Hives and Other (See Comments)    Has patient had a PCN reaction causing immediate rash, facial/tongue/throat swelling, SOB or lightheadedness with hypotension: No Has patient had a PCN reaction causing severe rash involving mucus membranes or skin necrosis: No Has patient had a PCN reaction that required hospitalization No Has patient had a PCN reaction occurring within the last 10 years: No If all of the above answers are "NO", then may proceed with Cephalosporin use.    REVIEW OF SYSTEMS:   Review of Systems  Constitutional: Positive for chills, malaise/fatigue and weight loss. Negative for fever.  HENT: Negative for sore throat.   Eyes: Negative for blurred vision and double vision.  Respiratory: Negative for cough, hemoptysis, sputum production, shortness of breath and wheezing.   Cardiovascular: Negative for chest pain, palpitations and leg swelling.  Gastrointestinal: Positive for abdominal pain, nausea and vomiting. Negative for blood in stool, diarrhea and melena.  Genitourinary: Negative for dysuria, frequency, hematuria and urgency.  Musculoskeletal: Negative for back pain.  Skin: Negative for itching and rash.  Neurological: Positive for dizziness and weakness. Negative for focal weakness, loss of consciousness and headaches.  Psychiatric/Behavioral: Negative for depression. The patient is not nervous/anxious.     MEDICATIONS AT HOME:   Prior to  Admission medications   Medication Sig Start Date End Date Taking? Authorizing Provider  cyclobenzaprine (FLEXERIL) 5 MG tablet Take 1 tablet (5 mg total) by mouth 3 (three) times daily as needed for muscle spasms. 09/03/16  Yes Lequita Asal, MD  guaifenesin (ROBITUSSIN) 100 MG/5ML syrup Take 200 mg by mouth 3 (three) times daily as needed for cough or congestion.   Yes Historical Provider, MD  ibuprofen (ADVIL,MOTRIN) 200 MG tablet Take by mouth every 6 (six) hours as needed.    Yes  Historical Provider, MD  KLOR-CON 10 10 MEQ tablet TAKE 3 TABS BY MOUTH IN THE MORNING AND 2 TABS BY MOUTH IN THE EVENING 08/03/16  Yes Lequita Asal, MD  lidocaine-prilocaine (EMLA) cream Apply 1 application topically as needed. 04/22/16  Yes Cammie Sickle, MD  megestrol (MEGACE) 400 MG/10ML suspension Take 5 mLs (200 mg total) by mouth daily. 07/29/16  Yes Lequita Asal, MD  metoCLOPramide (REGLAN) 5 MG tablet Take 1 tablet (5 mg total) by mouth every 8 (eight) hours as needed for nausea. 08/16/16  Yes Lequita Asal, MD  Multiple Vitamin (MULTIVITAMIN) tablet Take 1 tablet by mouth daily.   Yes Historical Provider, MD  nicotine (NICODERM CQ - DOSED IN MG/24 HOURS) 21 mg/24hr patch Place 1 patch (21 mg total) onto the skin daily. 04/16/16  Yes Lequita Asal, MD  traMADol (ULTRAM) 50 MG tablet Take 1 tablet (50 mg total) by mouth every 6 (six) hours as needed. 08/05/16  Yes Lequita Asal, MD  GuaiFENesin (MUCINEX PO) Take by mouth.    Historical Provider, MD  guaiFENesin-codeine (CHERATUSSIN AC) 100-10 MG/5ML syrup Take 5 mLs by mouth 3 (three) times daily as needed for cough. Patient not taking: Reported on 09/07/2016 05/12/16   Lequita Asal, MD  levofloxacin (LEVAQUIN) 500 MG tablet Take 1 tablet (500 mg total) by mouth daily. Patient not taking: Reported on 09/07/2016 08/30/16   Lequita Asal, MD  ondansetron (ZOFRAN) 8 MG tablet Take 1 tablet (8 mg total) by mouth every 8 (eight) hours as needed for nausea or vomiting. Patient not taking: Reported on 09/07/2016 08/05/16   Lequita Asal, MD  prochlorperazine (COMPAZINE) 10 MG tablet Take 1 tablet (10 mg total) by mouth every 6 (six) hours as needed for nausea or vomiting. Patient not taking: Reported on 09/07/2016 04/22/16   Cammie Sickle, MD      VITAL SIGNS:  Blood pressure 113/73, pulse (!) 126, temperature 97.5 F (36.4 C), temperature source Oral, resp. rate 16, height '5\' 7"'$  (1.702 m), weight 89  lb 11.2 oz (40.7 kg), last menstrual period 04/14/2004, SpO2 100 %.  PHYSICAL EXAMINATION:  Physical Exam  GENERAL:  55 y.o.-year-old patient lying in the bed with no acute distress. Severe Malnutrition. EYES: Pupils equal, round, reactive to light and accommodation. No scleral icterus. Extraocular muscles intact. Pale conjunctiva. HEENT: Head atraumatic, normocephalic. Oropharynx and nasopharynx clear.  NECK:  Supple, no jugular venous distention. No thyroid enlargement, no tenderness.  LUNGS: Normal breath sounds bilaterally, no wheezing, rales,rhonchi or crepitation. No use of accessory muscles of respiration.  CARDIOVASCULAR: S1, S2 normal. No murmurs, rubs, or gallops.   ABDOMEN: Soft, nontender, nondistended. Bowel sounds present. No organomegaly or mass.  EXTREMITIES: No pedal edema, cyanosis, or clubbing.  NEUROLOGIC: Cranial nerves II through XII are intact. Muscle strength 5/5 in all extremities. Sensation intact. Gait not checked.  PSYCHIATRIC: The patient is alert and oriented x 3.  SKIN: No obvious rash,  lesion, or ulcer.   LABORATORY PANEL:   CBC  Recent Labs Lab 09/06/16 1149  WBC 25.0*  HGB 6.4*  HCT 19.8*  PLT 87*   ------------------------------------------------------------------------------------------------------------------  Chemistries   Recent Labs Lab 09/01/16 1601 09/06/16 1149  NA 132* 131*  K 2.9* 2.8*  CL 104 98*  CO2 17* 19*  GLUCOSE 116* 177*  BUN 6 11  CREATININE 0.46 0.95  CALCIUM 6.8* 8.9  AST 29  --   ALT 23  --   ALKPHOS 99  --   BILITOT 0.5  --    ------------------------------------------------------------------------------------------------------------------  Cardiac Enzymes No results for input(s): TROPONINI in the last 168 hours. ------------------------------------------------------------------------------------------------------------------  RADIOLOGY:  Ct Chest Wo Contrast  Result Date: 09/06/2016 CLINICAL DATA:   Squamous cell carcinoma LEFT lung. EXAM: CT CHEST WITHOUT CONTRAST TECHNIQUE: Multidetector CT imaging of the chest was performed following the standard protocol without IV contrast. COMPARISON:  CT 07/05/2016, PET-CT 04/14/2016 FINDINGS: Cardiovascular: No significant vascular findings. Normal heart size. No pericardial effusion. Mediastinum/Nodes: No axillary supraclavicular adenopathy. No adenopathy identified on this noncontrast exam. Lungs/Pleura: There is interval increase in nodularity along the superior aspect of the cavitary lesion LEFT upper lobe. For example 2.9 by 2.6 cm nodular mass is increased from 2.2 x 1.0 cm (image 45, series 3). Increased nodular consolidation within the LEFT lower lobe with 4.1 by 3.1 cm mass increased from 2.6 by 2.0 cm nodule (image 70, series 3). Several small sub cm nodules in the RIGHT middle lobe (Image 81 and 87 of series 3) not seen on prior. Nodule pleural parenchymal thickening at the RIGHT lung base (image 122, series 3 is similar). Upper Abdomen: Metastasis are not well demonstrated on this noncontrast exam. There is suggestive of progression of the LEFT hepatic lobe lesion measuring 6.5 cm increased from 3.4 cm. Renal metastasis not well demonstrated Musculoskeletal: No aggressive osseous lesion. IMPRESSION: 1. Recurrence / progression of lung cancer with increased nodular and masslike consolidation in the LEFT upper lobe and LEFT lower lobe. 2. Several new small nodules in the RIGHT middle lobe. 3. Progression of hepatic metastasis. 4. Renal metastatic lesions not well demonstrated. Electronically Signed   By: Suzy Bouchard M.D.   On: 09/06/2016 13:54      IMPRESSION AND PLAN:   Sepsis (leukocytosis, tachycardia and T 96.6), need to rule out lung abscess or pneumonia. The patient is admitted to medical floor. I will start Levaquin and vancomycin, follow-up CBC, blood culture and thoracic surgery consult.  Anemia of chronic disease. Hemoglobin decreased  to 5.8 today. PRBC transfusion 2 units and follow-up hemoglobin.  Nausea, vomiting, poor oral intake and weight loss, possible due to progressive lung cancer with metastasis to liver and kidney. Supportive care and GI consult.  Lactic acidosis. Continue antibiotics and IV fluid support, follow-up lactic acid. Hypokalemia. Give potassium supplement, follow-up BMP and magnesium.  Hyponatremia. Start normal saline IV and follow-up BMP.  Serum malnutrition. Dietitian consult.  Progressive Lung cancer with metastasis, stage IV. Palliative care consult.   I discussed with Dr. Mike Gip, oncologist and Stanton Kidney, NP of palliative care team. All the records are reviewed and case discussed with ED provider. Management plans discussed with the patient, her father and they are in agreement.  CODE STATUS: Full code  TOTAL TIME TAKING CARE OF THIS PATIENT: 62 minutes.    Demetrios Loll M.D on 09/07/2016 at 2:34 PM  Between 7am to 6pm - Pager - [phone]  After 6pm go to [email or web address] - [place]  [facility]  Office  [phone]  CC: Primary care physician; No PCP Per Patient   Note: This dictation was prepared with Dragon dictation along with smaller phrase technology. Any transcriptional errors that result from this process are unintentional.

## 2016-09-07 NOTE — Telephone Encounter (Signed)
Called pt after getting bed assignment for admission to room 120.  I told pt that [facility] admitting / registration desk then they will take her to room 120. She will have her Dad come by when he finishes his errands and bring her to hosp  Then I called the floor and spoke to [place] who will be caring for the patient and went over hypokalemia, failure to thrive, n/v, and lung cancer.  Last opdivo treatment date, she di have ct scan that showed progression of disease and [facility] was to come over and tell her about results. She will need blood transfusion, given IV meds for nausea. Mike Gip has already spoke to Dr. Allen Norris and Dr. Genevive Bi about seeing pt while in hospital.  She does have a port but it is not accessed because pt wanted it to come out yest and not go home with it in place.

## 2016-09-08 ENCOUNTER — Ambulatory Visit: Payer: BLUE CROSS/BLUE SHIELD

## 2016-09-08 ENCOUNTER — Telehealth: Payer: Self-pay | Admitting: *Deleted

## 2016-09-08 ENCOUNTER — Inpatient Hospital Stay: Payer: BLUE CROSS/BLUE SHIELD

## 2016-09-08 DIAGNOSIS — E43 Unspecified severe protein-calorie malnutrition: Secondary | ICD-10-CM | POA: Insufficient documentation

## 2016-09-08 DIAGNOSIS — C3432 Malignant neoplasm of lower lobe, left bronchus or lung: Secondary | ICD-10-CM

## 2016-09-08 DIAGNOSIS — R531 Weakness: Secondary | ICD-10-CM

## 2016-09-08 DIAGNOSIS — Z515 Encounter for palliative care: Secondary | ICD-10-CM

## 2016-09-08 DIAGNOSIS — C787 Secondary malignant neoplasm of liver and intrahepatic bile duct: Secondary | ICD-10-CM

## 2016-09-08 DIAGNOSIS — R112 Nausea with vomiting, unspecified: Secondary | ICD-10-CM

## 2016-09-08 DIAGNOSIS — Z7189 Other specified counseling: Secondary | ICD-10-CM

## 2016-09-08 DIAGNOSIS — D72829 Elevated white blood cell count, unspecified: Secondary | ICD-10-CM

## 2016-09-08 DIAGNOSIS — R630 Anorexia: Secondary | ICD-10-CM

## 2016-09-08 DIAGNOSIS — E860 Dehydration: Secondary | ICD-10-CM

## 2016-09-08 DIAGNOSIS — E876 Hypokalemia: Secondary | ICD-10-CM

## 2016-09-08 DIAGNOSIS — R634 Abnormal weight loss: Secondary | ICD-10-CM

## 2016-09-08 DIAGNOSIS — E871 Hypo-osmolality and hyponatremia: Secondary | ICD-10-CM

## 2016-09-08 DIAGNOSIS — G893 Neoplasm related pain (acute) (chronic): Secondary | ICD-10-CM

## 2016-09-08 DIAGNOSIS — E893 Postprocedural hypopituitarism: Secondary | ICD-10-CM

## 2016-09-08 DIAGNOSIS — R627 Adult failure to thrive: Secondary | ICD-10-CM

## 2016-09-08 DIAGNOSIS — D649 Anemia, unspecified: Secondary | ICD-10-CM

## 2016-09-08 DIAGNOSIS — E878 Other disorders of electrolyte and fluid balance, not elsewhere classified: Secondary | ICD-10-CM

## 2016-09-08 LAB — TYPE AND SCREEN
ABO/RH(D): A POS
ANTIBODY SCREEN: NEGATIVE
Unit division: 0
Unit division: 0

## 2016-09-08 LAB — BASIC METABOLIC PANEL
Anion gap: 4 — ABNORMAL LOW (ref 5–15)
BUN: 9 mg/dL (ref 6–20)
CALCIUM: 7.3 mg/dL — AB (ref 8.9–10.3)
CO2: 23 mmol/L (ref 22–32)
CREATININE: 0.58 mg/dL (ref 0.44–1.00)
Chloride: 107 mmol/L (ref 101–111)
GFR calc non Af Amer: >60 mL/min (ref >60)
Glucose, Bld: 97 mg/dL (ref 65–99)
Potassium: 6 mmol/L — ABNORMAL HIGH (ref 3.5–5.1)
SODIUM: 134 mmol/L — AB (ref 135–145)

## 2016-09-08 LAB — CBC
HEMATOCRIT: 26.2 % — AB (ref 35.0–47.0)
HEMOGLOBIN: 8.8 g/dL — AB (ref 12.0–16.0)
MCH: 30.1 pg (ref 26.0–34.0)
MCHC: 33.5 g/dL (ref 32.0–36.0)
MCV: 89.6 fL (ref 80.0–100.0)
Platelets: 75 10*3/uL — ABNORMAL LOW (ref 150–440)
RBC: 2.93 MIL/uL — ABNORMAL LOW (ref 3.80–5.20)
RDW: 20 % — AB (ref 11.5–14.5)
WBC: 17.5 10*3/uL — AB (ref 3.6–11.0)

## 2016-09-08 LAB — POTASSIUM: Potassium: 3 mmol/L — ABNORMAL LOW (ref 3.5–5.1)

## 2016-09-08 LAB — MRSA PCR SCREENING: MRSA BY PCR: NEGATIVE

## 2016-09-08 MED ORDER — MAGNESIUM SULFATE 2 GM/50ML IV SOLN
2.0000 g | Freq: Once | INTRAVENOUS | Status: AC
  Administered 2016-09-08: 15:00:00 2 g via INTRAVENOUS
  Filled 2016-09-08: qty 50

## 2016-09-08 MED ORDER — PREMIER PROTEIN SHAKE
11.0000 [oz_av] | ORAL | Status: DC
  Administered 2016-09-09: 11 [oz_av] via ORAL

## 2016-09-08 MED ORDER — SODIUM POLYSTYRENE SULFONATE 15 GM/60ML PO SUSP
30.0000 g | Freq: Once | ORAL | Status: AC
  Administered 2016-09-08: 08:00:00 30 g via ORAL
  Filled 2016-09-08: qty 120

## 2016-09-08 MED ORDER — ENOXAPARIN SODIUM 40 MG/0.4ML ~~LOC~~ SOLN
30.0000 mg | SUBCUTANEOUS | Status: DC
  Administered 2016-09-08: 22:00:00 30 mg via SUBCUTANEOUS
  Filled 2016-09-08 (×2): qty 0.4

## 2016-09-08 NOTE — Consult Note (Signed)
[facility]  Date of admission:  09/07/2016  Inpatient day:  09/08/2016  Consulting physician: Dr. Demetrios Loll  Reason for Consultation:  Lung cancer, anemia  Chief Complaint: Christina Mckee is a 55 y.o. female with metastatic lung cancer who was admitted with failure to thrive, chronic nausea and vomiting, anemia, leukocytosis, and electrolytes abnormalities (hyponatremia and hypokalemia).  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  Initial PET scan on 04/14/2016 confirmed metastatic disease with a hypermetabolic left lower lobe mass, left hilar adenopathy and left hepatic lobe lesions. He was treated with 3 cycles of carboplatin and Abraxane 04/30/2016 - 06/17/2016.  Imaging studies revealed progressive disease including liver metastasis, multiple bilateral renal lesions and bone metastasis.  She began on SWOG S1400 with nivolumab and ipilimumab on 08/02/2016.  She received a course of palliative radiation for pain.  She has had anemia due to chronic disease. She has had ongoing weight loss secondary to chronic nausea and vomiting.  Chest CT on 09/06/2016 revealed recurrent/progression of lung cancer with increased nodular and masslike consolidation in the left upper lobe and left lower lobe. There were several new small nodules in the right middle lobe. There was progression of hepatic metastasis. The renal metastatic lesions were not well demonstrated.  After multiple requests in the past, the patient agreed to admission to address her multitude of symptoms.  Symptomatically, since admission, the patient still feels "lousy".  She notes fatigue, ongoing nausea and poor oral intake.   Past Medical History:  Diagnosis Date  . DVT (deep venous thrombosis) ([place])    in 2015  . Menopause    prior to 2014  . Squamous cell lung cancer [facility])     Past Surgical History:  Procedure Laterality Date  .  BREAST CYST EXCISION     left breast removal  . CESAREAN SECTION    . IVC FILTER PLACEMENT ([facility] HX)    . PERIPHERAL VASCULAR CATHETERIZATION N/A 04/28/2016   Procedure: Glori Luis Cath Insertion;  Surgeon: Algernon Huxley, MD;  Location: [place] [facility];  Service: Cardiovascular;  Laterality: N/A;    Family History  Problem Relation Age of Onset  . Breast cancer Mother   . Breast cancer Maternal Aunt   . Stomach cancer Paternal Uncle   . Leukemia Paternal Uncle     Social History:  reports that she has been smoking Cigarettes.  She has been smoking about 0.25 packs per day. She has never used smokeless tobacco. She reports that she drinks alcohol. She reports that she does not use drugs.  She has a 30-40 pack year smoking history.  She lives in [place] alone with her cats and dogs. She is trying to find homes for her animals.  She is alone today.  Allergies:  Allergies  Allergen Reactions  . Penicillins Hives and Other (See Comments)    Has patient had a PCN reaction causing immediate rash, facial/tongue/throat swelling, SOB or lightheadedness with hypotension: No Has patient had a PCN reaction causing severe rash involving mucus membranes or skin necrosis: No Has patient had a PCN reaction that required hospitalization No Has patient had a PCN reaction occurring within the last 10 years: No If all of the above answers are "NO", then may proceed with Cephalosporin use.    Medications Prior to Admission  Medication Sig Dispense Refill  . cyclobenzaprine (FLEXERIL) 5 MG tablet Take 1 tablet (5 mg total) by mouth 3 (three)  times daily as needed for muscle spasms. 20 tablet 0  . guaifenesin (ROBITUSSIN) 100 MG/5ML syrup Take 200 mg by mouth 3 (three) times daily as needed for cough or congestion.    Marland Kitchen ibuprofen (ADVIL,MOTRIN) 200 MG tablet Take by mouth every 6 (six) hours as needed.     Marland Kitchen KLOR-CON 10 10 MEQ tablet TAKE 3 TABS BY MOUTH IN THE MORNING AND 2 TABS BY MOUTH IN THE EVENING  150 tablet 0  . lidocaine-prilocaine (EMLA) cream Apply 1 application topically as needed. 30 g 6  . megestrol (MEGACE) 400 MG/10ML suspension Take 5 mLs (200 mg total) by mouth daily. 150 mL 1  . metoCLOPramide (REGLAN) 5 MG tablet Take 1 tablet (5 mg total) by mouth every 8 (eight) hours as needed for nausea. 30 tablet 1  . Multiple Vitamin (MULTIVITAMIN) tablet Take 1 tablet by mouth daily.    . nicotine (NICODERM CQ - DOSED IN MG/24 HOURS) 21 mg/24hr patch Place 1 patch (21 mg total) onto the skin daily. 28 patch 0  . traMADol (ULTRAM) 50 MG tablet Take 1 tablet (50 mg total) by mouth every 6 (six) hours as needed. 60 tablet 0  . GuaiFENesin (MUCINEX PO) Take by mouth.    Marland Kitchen guaiFENesin-codeine (CHERATUSSIN AC) 100-10 MG/5ML syrup Take 5 mLs by mouth 3 (three) times daily as needed for cough. (Patient not taking: Reported on 09/07/2016) 120 mL 0  . levofloxacin (LEVAQUIN) 500 MG tablet Take 1 tablet (500 mg total) by mouth daily. (Patient not taking: Reported on 09/07/2016) 7 tablet 0  . ondansetron (ZOFRAN) 8 MG tablet Take 1 tablet (8 mg total) by mouth every 8 (eight) hours as needed for nausea or vomiting. (Patient not taking: Reported on 09/07/2016) 60 tablet 1  . prochlorperazine (COMPAZINE) 10 MG tablet Take 1 tablet (10 mg total) by mouth every 6 (six) hours as needed for nausea or vomiting. (Patient not taking: Reported on 09/07/2016) 30 tablet 6    Review of Systems: GENERAL: Feels lousy. No fevers or sweats. Notes "hot and cold" sensation. Weight loss. PERFORMANCE STATUS (ECOG): 1 HEENT: No visual changes, sore throat, mouth sores or tenderness.  Lungs:  Left sided chest discomfort (stable).  Chronic shortness of breath.  No pleuritic chest pain.  Thick phlegm.  No hemoptysis. Cardiac: No chest pain, palpitations, orthopnea, or PND. GI: Poor oral intake.  Nausea and vomiting.  No constipation, melena or hematochezia. GU: No urgency, frequency, dysuria, or  hematuria. Musculoskeletal: Left sided musculoskeletal pain.  Lumbar back pain. No muscle tenderness. Extremities: Chronic mild left lower extremity edema s/p distant clot. Wears Ted hose.  No pain. Skin: No rashes or skin changes. Neuro: Short term memory issues.  No headache, numbness or weakness, balance or coordination issues. Endocrine: No diabetes, thyroid issues, hot flashes or night sweats. Psych: No mood changes, depression or anxiety. Pain: Left lower rib rage discomfort.  Hip pain which at times shoots down right leg. Review of systems: All other systems reviewed and found to be negative  Physical Exam:  Blood pressure 131/69, pulse (!) 123, temperature 98.4 F (36.9 C), temperature source Oral, resp. rate 18, height '5\' 7"'  (1.702 m), weight 89 lb 11.2 oz (40.7 kg), last menstrual period 04/14/2004, SpO2 100 %.  GENERAL: Cachectic woman sitting in bed on the medical unit in no acute distress.  MENTAL STATUS: Alert and oriented to person, place and time. HEAD: Short gray hair.  Normocephalic, atraumatic, face symmetric, no Cushingoid features. EYES: Blue eyes. Pupils  equal round and reactive to light and accomodation. No conjunctivitis or scleral icterus. ENT: Oropharynx clear without lesion. Tongue normal. Mucous membranes moist.  RESPIRATORY: Clear to auscultation without rales, wheezes or rhonchi. CARDIOVASCULAR: Tachycardic.  Regular rate and rhythm without murmur, rub or gallop.  Central monitoring in place. ABDOMEN: Soft, non-tender, with active bowel sounds, and no hepatosplenomegaly. No masses. SKIN: No rashes, ulcers or lesions. EXTREMITIES:  No edema, skin discoloration or tenderness. No palpable cords. LYMPH NODES: No palpable cervical, supraclavicular, axillary or inguinal adenopathy  NEUROLOGICAL: Appropriate. PSYCH: Appropriate.   Results for orders placed or performed during the hospital encounter of 09/07/16 (from the past 48 hour(s))   Type and screen [place]     Status: None (Preliminary result)   Collection Time: 09/06/16 11:49 AM  Result Value Ref Range   ABO/RH(D) A POS    Antibody Screen NEG    Sample Expiration 09/09/2016    Unit Number [id number]    Blood Component Type RED CELLS,LR    Unit division 00    Status of Unit ISSUED    Transfusion Status OK TO TRANSFUSE    Crossmatch Result Compatible    Unit Number [id number]    Blood Component Type RED CELLS,LR    Unit division 00    Status of Unit ISSUED    Transfusion Status OK TO TRANSFUSE    Crossmatch Result Compatible   ABO/Rh     Status: None   Collection Time: 09/07/16  2:00 PM  Result Value Ref Range   ABO/RH(D) A POS   Magnesium     Status: Abnormal   Collection Time: 09/07/16  2:14 PM  Result Value Ref Range   Magnesium 1.3 (L) 1.7 - 2.4 mg/dL  Lactic acid, plasma     Status: Abnormal   Collection Time: 09/07/16  2:14 PM  Result Value Ref Range   Lactic Acid, Venous 3.6 (HH) 0.5 - 1.9 mmol/L    Comment: CRITICAL RESULT CALLED TO, READ BACK BY AND VERIFIED WITH JEFF LUSH AT 1502 ON 09/07/16 SNJ   Procalcitonin     Status: None   Collection Time: 09/07/16  2:14 PM  Result Value Ref Range   Procalcitonin 3.82 ng/mL    Comment:        Interpretation: PCT > 2 ng/mL: Systemic infection (sepsis) is likely, unless other causes are known. (NOTE)         ICU PCT Algorithm               Non ICU PCT Algorithm    ----------------------------     ------------------------------         PCT < 0.25 ng/mL                 PCT < 0.1 ng/mL     Stopping of antibiotics            Stopping of antibiotics       strongly encouraged.               strongly encouraged.    ----------------------------     ------------------------------       PCT level decrease by               PCT < 0.25 ng/mL       >= 80% from peak PCT       OR PCT 0.25 - 0.5 ng/mL          Stopping of antibiotics  encouraged.     Stopping of antibiotics           encouraged.    ----------------------------     ------------------------------       PCT level decrease by              PCT >= 0.25 ng/mL       < 80% from peak PCT        AND PCT >= 0.5 ng/mL            Continuing antibiotics                                               encouraged.       Continuing antibiotics            encouraged.    ----------------------------     ------------------------------     PCT level increase compared          PCT > 0.5 ng/mL         with peak PCT AND          PCT >= 0.5 ng/mL             Escalation of antibiotics                                          strongly encouraged.      Escalation of antibiotics        strongly encouraged.   Protime-INR     Status: Abnormal   Collection Time: 09/07/16  2:14 PM  Result Value Ref Range   Prothrombin Time 16.6 (H) 11.4 - 15.2 seconds   INR 1.33   APTT     Status: None   Collection Time: 09/07/16  2:14 PM  Result Value Ref Range   aPTT 33 24 - 36 seconds  Comprehensive metabolic panel     Status: Abnormal   Collection Time: 09/07/16  2:14 PM  Result Value Ref Range   Sodium 132 (L) 135 - 145 mmol/L   Potassium 3.0 (L) 3.5 - 5.1 mmol/L   Chloride 99 (L) 101 - 111 mmol/L   CO2 20 (L) 22 - 32 mmol/L   Glucose, Bld 139 (H) 65 - 99 mg/dL   BUN 10 6 - 20 mg/dL   Creatinine, Ser 0.77 0.44 - 1.00 mg/dL   Calcium 8.6 (L) 8.9 - 10.3 mg/dL   Total Protein 5.3 (L) 6.5 - 8.1 g/dL   Albumin 2.2 (L) 3.5 - 5.0 g/dL   AST 30 15 - 41 U/L   ALT 21 14 - 54 U/L   Alkaline Phosphatase 135 (H) 38 - 126 U/L   Total Bilirubin 0.5 0.3 - 1.2 mg/dL   GFR calc non Af Amer >60 >60 mL/min   GFR calc Af Amer >60 >60 mL/min    Comment: (NOTE) The eGFR has been calculated using the CKD EPI equation. This calculation has not been validated in all clinical situations. eGFR's persistently <60 mL/min signify possible Chronic Kidney Disease.    Anion gap 13 5 - 15  CBC with  Differential     Status: Abnormal   Collection Time: 09/07/16  2:14 PM  Result Value Ref Range   WBC 23.2 (H) 3.6 - 11.0 K/uL  RBC 1.84 (L) 3.80 - 5.20 MIL/uL   Hemoglobin 5.8 (L) 12.0 - 16.0 g/dL   HCT 17.9 (L) 35.0 - 47.0 %   MCV 97.6 80.0 - 100.0 fL   MCH 31.5 26.0 - 34.0 pg   MCHC 32.2 32.0 - 36.0 g/dL   RDW 18.3 (H) 11.5 - 14.5 %   Platelets 76 (L) 150 - 440 K/uL   Neutrophils Relative % 91 %   Neutro Abs 21.2 (H) 1.4 - 6.5 K/uL   Lymphocytes Relative 2 %   Lymphs Abs 0.4 (L) 1.0 - 3.6 K/uL   Monocytes Relative 7 %   Monocytes Absolute 1.5 (H) 0.2 - 0.9 K/uL   Eosinophils Relative 0 %   Eosinophils Absolute 0.1 0 - 0.7 K/uL   Basophils Relative 0 %   Basophils Absolute 0.0 0 - 0.1 K/uL  Prepare RBC     Status: None   Collection Time: 09/07/16  2:14 PM  Result Value Ref Range   Order Confirmation ORDER PROCESSED BY BLOOD BANK   Urinalysis complete, with microscopic ([facility] only)     Status: Abnormal   Collection Time: 09/07/16  7:50 PM  Result Value Ref Range   Color, Urine YELLOW (A) YELLOW   APPearance CLEAR (A) CLEAR   Glucose, UA NEGATIVE NEGATIVE mg/dL   Bilirubin Urine NEGATIVE NEGATIVE   Ketones, ur NEGATIVE NEGATIVE mg/dL   Specific Gravity, Urine 1.011 1.005 - 1.030   Hgb urine dipstick NEGATIVE NEGATIVE   pH 6.0 5.0 - 8.0   Protein, ur 30 (A) NEGATIVE mg/dL   Nitrite NEGATIVE NEGATIVE   Leukocytes, UA NEGATIVE NEGATIVE   RBC / HPF NONE SEEN 0 - 5 RBC/hpf   WBC, UA 0-5 0 - 5 WBC/hpf   Bacteria, UA NONE SEEN NONE SEEN   Squamous Epithelial / LPF NONE SEEN NONE SEEN   Mucous PRESENT   Lactic acid, plasma     Status: None   Collection Time: 09/07/16  9:21 PM  Result Value Ref Range   Lactic Acid, Venous 1.5 0.5 - 1.9 mmol/L   Ct Chest Wo Contrast  Result Date: 09/06/2016 CLINICAL DATA:  Squamous cell carcinoma LEFT lung. EXAM: CT CHEST WITHOUT CONTRAST TECHNIQUE: Multidetector CT imaging of the chest was performed following the standard protocol without  IV contrast. COMPARISON:  CT 07/05/2016, PET-CT 04/14/2016 FINDINGS: Cardiovascular: No significant vascular findings. Normal heart size. No pericardial effusion. Mediastinum/Nodes: No axillary supraclavicular adenopathy. No adenopathy identified on this noncontrast exam. Lungs/Pleura: There is interval increase in nodularity along the superior aspect of the cavitary lesion LEFT upper lobe. For example 2.9 by 2.6 cm nodular mass is increased from 2.2 x 1.0 cm (image 45, series 3). Increased nodular consolidation within the LEFT lower lobe with 4.1 by 3.1 cm mass increased from 2.6 by 2.0 cm nodule (image 70, series 3). Several small sub cm nodules in the RIGHT middle lobe (Image 81 and 87 of series 3) not seen on prior. Nodule pleural parenchymal thickening at the RIGHT lung base (image 122, series 3 is similar). Upper Abdomen: Metastasis are not well demonstrated on this noncontrast exam. There is suggestive of progression of the LEFT hepatic lobe lesion measuring 6.5 cm increased from 3.4 cm. Renal metastasis not well demonstrated Musculoskeletal: No aggressive osseous lesion. IMPRESSION: 1. Recurrence / progression of lung cancer with increased nodular and masslike consolidation in the LEFT upper lobe and LEFT lower lobe. 2. Several new small nodules in the RIGHT middle lobe. 3. Progression of hepatic metastasis.  4. Renal metastatic lesions not well demonstrated. Electronically Signed   By: Suzy Bouchard M.D.   On: 09/06/2016 13:54    Assessment:  The patient is a 55 y.o. woman with progressive metastatic squamous cell lung cancer s/p carboplatin and Abraxane (04/30/2016 - 06/17/2016) and nivolumab and ipilimumab on SWOG S1400 (08/02/2016 - present).  Chest CT on 09/06/2016 revealed recurrent/progression of lung cancer with increased nodular and masslike consolidation in the left upper lobe and left lower lobe. There were several new small nodules in the right middle lobe. There was progression of hepatic  metastasis. The renal metastatic lesions were not well demonstrated.  She has had progressive weight loss secondary to poor oral intake and poorly controlled nausea and vomiting.  She has anemia of chronic disease.  She has leukocytosis worrisome for an underlying infection, possibly related to the cavitary mass in the left lower lobe.  Plan:   1.  Oncology:  Discussed with patient progression noted on scan.  Patient taken off of SWOG S1400 protocol.  Discussed prior mutational testing (negative EGFR, ALK, ROS1, BRAF).  Discuss treatment to date.  Discuss limited options.  Discuss follow-up with [facility] clinical trials for eligibility based on Foundation One testing.  Do not feel patient will tolerate Taxotere with suspected decrease response rate given prior Abraxane.  Discuss gemcitabine single agent (tolerability good but low reponse rate of approximately 19%).  Discussed supportive care alone (Hospice).  Patient currently interested in trying another treatment although she is aware that she will likely not respond.  Appreciate palliative care consult.  Patient desires full code at this time  2.  Hematology:  Patient s/p 2 units of PRBCs with hemoglobin increase from 5.8 to 8.8.  No evidence of bleeding.  Patient has anemia of chronic disease.  3.  Pulmonary/thoracic surgery: Progressive disease noted in chest with several new nodules.  Left lower lobe mass cavitating.  Unclear if related to tumor necrosis or infection.  Appreciate consult from Dr Genevive Bi.  No role for surgery.  4.  Gastroenterology:  Patient with ongoing nausea and vomiting with symptoms delayed at times for several hours after eating.  Prior unsuccessful trial of Reglan.  UGI study revealed severe gastroesophageal reflux.  Patient notes nausea/GI upset caused by any IV medications.  Patient previously tried omeprazole in outpatient department.  Patient written for Protonix.  May consider Carafate.  Patient has tried ondansetron (4-8 mg),  Reglan (5 mg), Phenergan (12.5 mg) without success in outpatient department.  Phenergan 12.5 mg made her too sleepy.   Will try Phenergan 6.25 mg q 6 hours prn.  Could consider very low dose Ativan (0.25 mg) given patient's sensitivity to medications.  Will try Decadron (2 mg BID) for nausea and appetite stimulation.  Appreciate Dr. Dorothey Baseman assistance.  5.  Fluids/electrolytes/nutrition:  Patient with chronic malnutrition secondary to poor oral intake.  She has eaten little since admission.  She is trying various foods as recommended by nutrition.  She will not drink Ensure/Boost.  Check AM cortisol level to r/o adrenal insufficiency.  Patient with chronic hyponatremia and hypokalemia.  She has received potassium supplementation.  Patient received kayexalate for elevated potassium yesterday.  6.  Infectious disease:  All cultures negative.  Lactic acid high.  Patient on vancomycin and Levaquin.  WBC has decreased from 25,000 to 17,000.  7.  Disposition:  Patient states that she does not know if she can return to work.  Previously her goal was to continue working until 09/22/2016 when she  could go onto short term disability.  She would like to look in to disability on social security.  Social work follow-up needed.   Thank you for allowing me to participate in Christina Mckee 's care.  I will follow her closely with you while hospitalized and after discharge in the outpatient department.  I will be out of the office on 09/09/2016 and 09/10/2016.  Please call or page me with any questions [phone]).   Lequita Asal, MD  09/08/2016, 7:15 pm

## 2016-09-08 NOTE — Plan of Care (Signed)
Problem: Pain Managment: Goal: General experience of comfort will improve Outcome: Progressing Pt has chronic back pain meds given with improvement  Problem: Nutrition: Goal: Adequate nutrition will be maintained Outcome: Not Progressing Cont with nausea and some vomiting med given

## 2016-09-08 NOTE — Progress Notes (Signed)
Initial Nutrition Assessment  DOCUMENTATION CODES:   Severe malnutrition in context of chronic illness, Underweight  INTERVENTION:  Premier Protein po once daily, each supplement provides 160 kcal and 30 grams protein.  Magic Cup TID with meals (Vanilla or [employer]), each supplement provides 290 kcal and 9 grams protein.  Encouraged adequate intake of calories and protein through small, frequent meals and beverages.   Provided handout and education on Nausea and Vomiting Medical Nutrition Therapy.   RD will continue to monitor intake and discussion regarding goals of care to determine if more aggressive nutrition intervention is appropriate.  NUTRITION DIAGNOSIS:   Inadequate oral intake related to poor appetite, nausea, vomiting as evidenced by meal completion < 50%, per patient/family report.  GOAL:   Patient will meet greater than or equal to 90% of their needs  MONITOR:   PO intake, Supplement acceptance, Labs, I & O's  REASON FOR ASSESSMENT:   Malnutrition Screening Tool, Consult Assessment of nutrition requirement/status  ASSESSMENT:   55 year old female with history of lung cancer (diagnosed April 2017) with metastasis and DVT, admitted for nausea/vomiting, weight loss, poor oral intake, and generalized weakness for the past month.   -Received 2 cycles nivolumab + ipilumumab 08/02/2016-08/16/2016 -Completed palliative radiation (started 08/11/2016)  Patient reports she has had poor appetite for over 6 months. Endorses nausea and vomiting daily, and abdominal pain. Also reporting very poor intake during this time frame. She can only tolerate bites of food at a time. In the morning she can tolerate milk and orange juice, she can also tolerate yogurt, cottage cheese, and drinks 1 Premier Protein shake daily. Reports she cannot tolerate meat at this time.  She is amenable to also trying Magic Cup TID with meals. Will continue to encourage patient to get adequate intake  through meals and beverages. Encouraged small, frequent meals of well-tolerated foods.  Patient reports significant weight loss. Her UBW was 120 lbs. Per chart, she has lost 31 lbs (26% body weight) over 6 months (since April 2017), which is significant for time frame.  Meal Completion: 20-30%  Medications reviewed and include: Colace, magnesium sulfate 2 grams IV once today, Megace.  Labs reviewed: Sodium 134, Potassium 3.  Nutrition-Focused physical exam completed. Findings are severe fat depletion, severe muscle depletion, and no edema.   Patient meets criteria for severe chronic malnutrition due to 26% weight loss over 6 months, intake </=75% estimated energy requirement for >/= 1 month, severe fat depletion, severe muscle depletion.  Diet Order:  Diet full liquid Room service appropriate? Yes; Fluid consistency: Thin  Skin:  Reviewed, no issues  Last BM:  09/06/2016  Height:   Ht Readings from Last 1 Encounters:  09/07/16 '5\' 7"'$  (1.702 m)    Weight:   Wt Readings from Last 1 Encounters:  09/07/16 89 lb 11.2 oz (40.7 kg)    Ideal Body Weight:  61.36 kg  BMI:  Body mass index is 14.05 kg/m.  Estimated Nutritional Needs:   Kcal:  1220-1420 (30-35 kcal/kg)  Protein:  61-81 grams (1.5-2 grams/kg)  Fluid:  1.2-1.4 L/day  EDUCATION NEEDS:   Education needs addressed (N/V MNT. Importance of adequate calories and protein through small, frequent meals.)  Willey Blade, MS, RD, LDN Pager: [phone] After Hours Pager: [phone]

## 2016-09-08 NOTE — Progress Notes (Signed)
error 

## 2016-09-08 NOTE — Telephone Encounter (Addendum)
MD requested BRAF added to pathology test block. Called [facility] in [place], regarding adding BRAF to pathology request.  Spoke with Tammy.  She states the block was sent to research.  She will track it down and see if there is enough left to obtain BRAF.  Specimen Number - [id number].   Tammy called back and she has contacted Yolande Jolly, who has agreed to get the block back. Also wants [place] to call her back in one week to see if she has block back.  Phone # [phone].  Yolande Jolly called and said the block has been tested for Foundation One X 2.  Block was tested for PDL-1, EGFR, ALK and ROS 1. States Marlowe Kays in [place] is working right now on getting the block back for BRAF.  Yolande Jolly called back to inform us that she spoke with Foundation One and this patient is BRAF negative. MD notified.  I called Tammy at [facility] to let her know we will not be needing further testing.

## 2016-09-08 NOTE — Care Management (Signed)
Admitted to [facility] with the diagnosis of sepsis. Lives alone. Daughter is Caryl Pina [phone]). Goes to see Dr. Janece Canterbury for physician needs at the [facility]. Goes to the Urgent Care for other physician needs. History of Lung Cancer. Port placed about 5 months ago.. Decreased appetite for the last few months. Decreased weight from 120-97 lbs. No falls.  Takes care of all basic and instrumental activities of daily living herself, drives. Cane and rolling walker as needed for ambulation. Works at [employer].  No home health. No skilled facility. No home oxygen. Prescriptions are filled at [facility] at [facility] or one of the other locations. Chemotherapy in progress. Daughter will transport. Shelbie Ammons RN MSN CCM Care Management [phone]

## 2016-09-08 NOTE — Progress Notes (Signed)
Pt has active orders for lovenox '40mg'$  q 24 hr. Pt weight is <45kg therefore she will be transitioned to '30mg'$  q 24 hr per protocol.  Ramond Dial, Pharm.D Clinical Pharmacist

## 2016-09-08 NOTE — Consult Note (Signed)
Consultation Note Date: 09/08/2016   Patient Name: Christina Mckee  DOB: February 25, 1961  MRN: [id number]  Age / Sex: 55 y.o., female  PCP: No Pcp Per Patient Referring Physician: Vaughan Basta, MD  Reason for Consultation: Establishing goals of care and Psychosocial/spiritual support  HPI/Patient Profile:   55 y.o. female   admitted on 09/07/2016 with PMH (per oncology note) significant for  metastatic squamous cell lung cancer. Molecular studies were negative studies for EGFR, ALK, and ROS1. PD-L1 was 1%.  Chest CT angiogram on 03/05/2016 revealed 8.7 x 5.7 x 3.3 cm mass in the medial aspect of the superior segment of the left lower lobe extending into the medial aspect of the left upper lobe and into the left hilum. There was vascular and endobronchial encasement and narrowing. There were multiple small left lung nodules. Abdomen and pelvic CT scan revealed diffuse hepatic steatosis and no evidence of metastatic disease in the abdomen or pelvis.  CT guided left lower lobe biopsy on 03/29/2016 revealed moderately differentiated squamous cell carcinoma. CEA and LDH were normal on 04/05/2016.  PET scan on 04/14/2016 revealed hypermetabolic left lower lobe mass with hypermetabolic left hilar adenopathy and left hepatic lobe lesion. Findings were consistent with stage IV primary bronchogenic carcinoma. There were mildly hypermetabolic subcarinal lymph node versus mild esophageal uptake. There was hepatic steatosis. Head CT on 04/15/2016 was normal.  CEA (2.6) and LDH (128) were normal on 04/05/2016.  She has a history of an extensive left lower extremity thromboses in 12/2012. Because of the extensive clot burden, she underwent TPA with angioplasty on 12/28/2012. An IVC filter was placed on 12/29/2012 and still remains. Hypercoagulable workup was negative for Factor V Leiden, prothrombin gene  mutation, lupus anticoagulant, and antithrombin III. She was on Xarelto for 6 months.  She declined low dose Eliquis secondary to cost.  She received 3 cycles of carboplatin and Abraxane (04/30/2016 - 06/17/2016).  Chest, abdomen, and pelvic CT scan on 07/05/2016 revealed a mixed response.  There was response to therapy of dominant left lower lobe lung mass (5.7 x 3.3 cm to 5.5 x 2.3 cm).  Liver metastasis was likely progressive (3.4 x 2 cm).  There was development of multiple bilateral renal lesions.  There was new anterior L3 lucent and sclerotic lesion.  Head MRI on 07/24/2016 revealed no evidence of metastatic disease.  Lumbar spine MRI on 07/31/2016 revealed multiple vertebral body bone lesions most consistent with metastatic disease.  There was no evidence of epidural extension.  There was a small left paracentral disc protrusion at T12-L1. There was no evidence of nerve root impingement.  She began monthly Xgeva on 07/15/2016.  She has received 2 cycles of nivolumab + ipilumumab on SWOG S1400 (08/02/2016 - 08/16/2016).  She completed palliative radiation (began 08/11/2016).  She tells me that "I've done everything I was supposed to but the cancer keeps growing"  She has had slow continued physical and functional decline  She speaks to her frustration specifically to her financial problems and her attempt to keep  her job until Sep 22, 2016 when she will be eligible for short term disabilty.     Clinical Assessment and Goals of Care:  This NP Wadie Lessen reviewed medical records, received report from team, assessed the patient and then meet at the patient's bedside  to discuss diagnosis,  GOC,  disposition and options.  A  discussion was had today regarding advanced directives.  Concepts specific to code statuswas had.  The difference between a aggressive medical intervention path  and a palliative comfort care path for this patient at this time was had.  Values and goals of care important  to patient and family were attempted to be elicited.  Concept of  Palliative Care was discussed   Family encouraged to call with questions or concerns.  PMT will continue to support holistically.   SUMMARY OF RECOMMENDATIONS    Code Status/Advance Care Planning:  Full code- encouraged to consider DNR status knowing poor outcomes of CPR  in similar patients.      Malorie is able to verbalize that her desire for resusitation is limited to "only try once" and "I don't want to be left on machines"  Her daughter Caryl Pina is her HPOA   Palliative Prophylaxis:   Bowel Regimen, Frequent Pain Assessment and Oral Care   Psycho-social/Spiritual:   Today patient shares her feelings and frustrations regarding living with terminal illness.  Her financial burdens and fears for the future   Presence and active listening as patient shares her love and enjoyment of her many pets   Prognosis:   Unable to determine, high risk for decompensation  Discharge Planning: Home with Home Health      Primary Diagnoses: Present on Admission: . Sepsis ([place])   I have reviewed the medical record, interviewed the patient and family, and examined the patient. The following aspects are pertinent.  Past Medical History:  Diagnosis Date  . DVT (deep venous thrombosis) ([place])    in 2015  . Menopause    prior to 2014  . Squamous cell lung cancer [facility])    Social History   Social History  . Marital status: Divorced    Spouse name: N/A  . Number of children: N/A  . Years of education: N/A   Social History Main Topics  . Smoking status: Current Every Day Smoker    Packs/day: 0.25    Types: Cigarettes  . Smokeless tobacco: Never Used     Comment: 1/2 PPD to 1PPD  . Alcohol use 0.0 oz/week     Comment: once a week 1/2 a beer to 1 beer  . Drug use: No  . Sexual activity: Not Asked   Other Topics Concern  . None   Social History Narrative   Lives by herself with her pets. Works as a Special educational needs teacher   Family History  Problem Relation Age of Onset  . Breast cancer Mother   . Breast cancer Maternal Aunt   . Stomach cancer Paternal Uncle   . Leukemia Paternal Uncle    Scheduled Meds: . acetaminophen  650 mg Oral Once  . docusate sodium  100 mg Oral BID  . enoxaparin (LOVENOX) injection  30 mg Subcutaneous Q24H  . levofloxacin (LEVAQUIN) IV  500 mg Intravenous Q24H  . megestrol  200 mg Oral Daily  . nicotine  21 mg Transdermal Daily  . sodium chloride flush  3 mL Intravenous Q12H  . vancomycin  500 mg Intravenous Q18H   Continuous Infusions:  PRN Meds:.acetaminophen **OR** acetaminophen,  albuterol, bisacodyl, cyclobenzaprine, guaiFENesin-codeine, ondansetron **OR** ondansetron (ZOFRAN) IV, senna-docusate, traMADol Medications Prior to Admission:  Prior to Admission medications   Medication Sig Start Date End Date Taking? Authorizing Provider  cyclobenzaprine (FLEXERIL) 5 MG tablet Take 1 tablet (5 mg total) by mouth 3 (three) times daily as needed for muscle spasms. 09/03/16  Yes Lequita Asal, MD  guaifenesin (ROBITUSSIN) 100 MG/5ML syrup Take 200 mg by mouth 3 (three) times daily as needed for cough or congestion.   Yes Historical Provider, MD  ibuprofen (ADVIL,MOTRIN) 200 MG tablet Take by mouth every 6 (six) hours as needed.    Yes Historical Provider, MD  KLOR-CON 10 10 MEQ tablet TAKE 3 TABS BY MOUTH IN THE MORNING AND 2 TABS BY MOUTH IN THE EVENING 08/03/16  Yes Lequita Asal, MD  lidocaine-prilocaine (EMLA) cream Apply 1 application topically as needed. 04/22/16  Yes Cammie Sickle, MD  megestrol (MEGACE) 400 MG/10ML suspension Take 5 mLs (200 mg total) by mouth daily. 07/29/16  Yes Lequita Asal, MD  metoCLOPramide (REGLAN) 5 MG tablet Take 1 tablet (5 mg total) by mouth every 8 (eight) hours as needed for nausea. 08/16/16  Yes Lequita Asal, MD  Multiple Vitamin (MULTIVITAMIN) tablet Take 1 tablet by mouth daily.   Yes Historical Provider, MD    nicotine (NICODERM CQ - DOSED IN MG/24 HOURS) 21 mg/24hr patch Place 1 patch (21 mg total) onto the skin daily. 04/16/16  Yes Lequita Asal, MD  traMADol (ULTRAM) 50 MG tablet Take 1 tablet (50 mg total) by mouth every 6 (six) hours as needed. 08/05/16  Yes Lequita Asal, MD  GuaiFENesin (MUCINEX PO) Take by mouth.    Historical Provider, MD  guaiFENesin-codeine (CHERATUSSIN AC) 100-10 MG/5ML syrup Take 5 mLs by mouth 3 (three) times daily as needed for cough. Patient not taking: Reported on 09/07/2016 05/12/16   Lequita Asal, MD  levofloxacin (LEVAQUIN) 500 MG tablet Take 1 tablet (500 mg total) by mouth daily. Patient not taking: Reported on 09/07/2016 08/30/16   Lequita Asal, MD  ondansetron (ZOFRAN) 8 MG tablet Take 1 tablet (8 mg total) by mouth every 8 (eight) hours as needed for nausea or vomiting. Patient not taking: Reported on 09/07/2016 08/05/16   Lequita Asal, MD  prochlorperazine (COMPAZINE) 10 MG tablet Take 1 tablet (10 mg total) by mouth every 6 (six) hours as needed for nausea or vomiting. Patient not taking: Reported on 09/07/2016 04/22/16   Cammie Sickle, MD   Allergies  Allergen Reactions  . Penicillins Hives and Other (See Comments)    Has patient had a PCN reaction causing immediate rash, facial/tongue/throat swelling, SOB or lightheadedness with hypotension: No Has patient had a PCN reaction causing severe rash involving mucus membranes or skin necrosis: No Has patient had a PCN reaction that required hospitalization No Has patient had a PCN reaction occurring within the last 10 years: No If all of the above answers are "NO", then may proceed with Cephalosporin use.   Review of Systems  Constitutional: Positive for activity change, appetite change, fatigue and unexpected weight change.  Gastrointestinal: Positive for nausea and vomiting.  Musculoskeletal: Positive for back pain.    Physical Exam  Constitutional: She appears cachectic.  She appears ill.  HENT:  Mouth/Throat: Oropharynx is clear and moist.  Cardiovascular: Tachycardia present.   Neurological: She is alert.  Skin: Skin is warm and dry.    Vital Signs: BP 131/69 (BP Location: Right Arm)  Pulse (!) 123   Temp 98.4 F (36.9 C) (Oral)   Resp 18   Ht '5\' 7"'  (1.702 m)   Wt 40.7 kg (89 lb 11.2 oz)   LMP 04/14/2004 (Approximate) Comment: >12 years  SpO2 100%   BMI 14.05 kg/m  Pain Assessment: 0-10   Pain Score: 9    SpO2: SpO2: 100 % O2 Device:SpO2: 100 % O2 Flow Rate: .   IO: Intake/output summary:  Intake/Output Summary (Last 24 hours) at 09/08/16 1004 Last data filed at 09/08/16 0421  Gross per 24 hour  Intake             1332 ml  Output                0 ml  Net             1332 ml    LBM: Last BM Date: 09/06/16 Baseline Weight: Weight: 40.7 kg (89 lb 11.2 oz) Most recent weight: Weight: 40.7 kg (89 lb 11.2 oz)      Palliative Assessment/Data: 60 % at best   Discussed with Dr Anselm Jungling  Time In: 0930 Time Out: 1030 Time Total: 60 min Greater than 50%  of this time was spent counseling and coordinating care related to the above assessment and plan.  Signed by: Wadie Lessen, NP   Please contact Palliative Medicine Team phone at [phone] for questions and concerns.  For individual provider: See Shea Evans

## 2016-09-09 LAB — ACID FAST CULTURE WITH REFLEXED SENSITIVITIES (MYCOBACTERIA)

## 2016-09-09 LAB — CBC
HCT: 27.1 % — ABNORMAL LOW (ref 35.0–47.0)
Hemoglobin: 9 g/dL — ABNORMAL LOW (ref 12.0–16.0)
MCH: 29.9 pg (ref 26.0–34.0)
MCHC: 33.4 g/dL (ref 32.0–36.0)
MCV: 89.6 fL (ref 80.0–100.0)
PLATELETS: 70 10*3/uL — AB (ref 150–440)
RBC: 3.02 MIL/uL — AB (ref 3.80–5.20)
RDW: 20.1 % — ABNORMAL HIGH (ref 11.5–14.5)
WBC: 22.1 10*3/uL — AB (ref 3.6–11.0)

## 2016-09-09 LAB — EXPECTORATED SPUTUM ASSESSMENT W REFEX TO RESP CULTURE: SPECIAL REQUESTS: NORMAL

## 2016-09-09 LAB — BASIC METABOLIC PANEL
ANION GAP: 7 (ref 5–15)
BUN: 10 mg/dL (ref 6–20)
CO2: 27 mmol/L (ref 22–32)
Calcium: 7.5 mg/dL — ABNORMAL LOW (ref 8.9–10.3)
Chloride: 99 mmol/L — ABNORMAL LOW (ref 101–111)
Creatinine, Ser: 0.64 mg/dL (ref 0.44–1.00)
Glucose, Bld: 117 mg/dL — ABNORMAL HIGH (ref 65–99)
POTASSIUM: 2.9 mmol/L — AB (ref 3.5–5.1)
SODIUM: 133 mmol/L — AB (ref 135–145)

## 2016-09-09 LAB — CORTISOL: Cortisol, Plasma: 19 ug/dL

## 2016-09-09 LAB — MAGNESIUM: MAGNESIUM: 1.6 mg/dL — AB (ref 1.7–2.4)

## 2016-09-09 LAB — EXPECTORATED SPUTUM ASSESSMENT W GRAM STAIN, RFLX TO RESP C

## 2016-09-09 LAB — ACID FAST SMEAR (AFB)

## 2016-09-09 LAB — ACID FAST SMEAR (AFB, MYCOBACTERIA)

## 2016-09-09 MED ORDER — MAGNESIUM SULFATE 2 GM/50ML IV SOLN
2.0000 g | Freq: Once | INTRAVENOUS | Status: AC
  Administered 2016-09-09: 2 g via INTRAVENOUS
  Filled 2016-09-09: qty 50

## 2016-09-09 MED ORDER — PANTOPRAZOLE SODIUM 40 MG PO TBEC: 40.0000 mg | DELAYED_RELEASE_TABLET | Freq: Two times a day (BID) | ORAL | 0 refills | Status: AC

## 2016-09-09 MED ORDER — DEXAMETHASONE 2 MG PO TABS
2.0000 mg | ORAL_TABLET | Freq: Two times a day (BID) | ORAL | Status: DC
  Administered 2016-09-09: 10:00:00 2 mg via ORAL
  Filled 2016-09-09 (×2): qty 1

## 2016-09-09 MED ORDER — PROMETHAZINE HCL 12.5 MG PO TABS: 6.0000 mg | ORAL_TABLET | Freq: Four times a day (QID) | ORAL | Status: DC | PRN

## 2016-09-09 MED ORDER — HEPARIN SOD (PORK) LOCK FLUSH 100 UNIT/ML IV SOLN
500.0000 [IU] | Freq: Once | INTRAVENOUS | Status: AC
  Administered 2016-09-09: 500 [IU] via INTRAVENOUS
  Filled 2016-09-09: qty 5

## 2016-09-09 MED ORDER — METOCLOPRAMIDE HCL 10 MG PO TABS: 5.0000 mg | ORAL_TABLET | Freq: Three times a day (TID) | ORAL | Status: DC | PRN

## 2016-09-09 MED ORDER — LEVOFLOXACIN 500 MG PO TABS: 500.0000 mg | ORAL_TABLET | Freq: Every day | ORAL | 0 refills | Status: DC

## 2016-09-09 MED ORDER — POTASSIUM CHLORIDE 20 MEQ PO PACK
40.0000 meq | PACK | Freq: Two times a day (BID) | ORAL | Status: DC
Start: 2016-09-09 — End: 2016-09-09
  Administered 2016-09-09: 40 meq via ORAL
  Filled 2016-09-09: qty 2

## 2016-09-09 MED ORDER — METOCLOPRAMIDE HCL 5 MG PO TABS: 5.0000 mg | ORAL_TABLET | Freq: Three times a day (TID) | ORAL | 1 refills | Status: DC

## 2016-09-09 MED ORDER — PANTOPRAZOLE SODIUM 40 MG PO TBEC
40.0000 mg | DELAYED_RELEASE_TABLET | Freq: Every day | ORAL | Status: DC
  Administered 2016-09-09: 40 mg via ORAL
  Filled 2016-09-09: qty 1

## 2016-09-09 MED ORDER — PROMETHAZINE HCL 25 MG/ML IJ SOLN: 6.2500 mg | Freq: Four times a day (QID) | INTRAMUSCULAR | Status: DC | PRN

## 2016-09-09 NOTE — Consult Note (Signed)
[facility] Infectious Disease     Reason for Consult:Lung cavity, PNA   Referring Physician: Dolores Frame Date of Admission:  09/07/2016   Active Problems:   Sepsis ([place])   Intractable vomiting with nausea   DNR (do not resuscitate) discussion   Palliative care encounter   Weakness generalized   Protein-calorie malnutrition, severe   HPI: Christina Mckee is a 55 y.o. female admitted with FTT, nausea vomiting and leukocytosis. She has metastatic lung cancer. CT scan showed increased cavitary nodularity LUL and LLL as welll as several smaller nodules.  She was placed on levo and vanco. BCx neg, Sputum cx not acceptable for culture.  No fevers, wbc remains elevated but has decreased She currently denies fevers, hemoptysis, prior TB, hx TB contact.  No night sweats   Past Medical History:  Diagnosis Date  . DVT (deep venous thrombosis) ([place])    in 2015  . Menopause    prior to 2014  . 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 BREAST CYST EXCISION     left breast removal  . CESAREAN SECTION    . IVC FILTER PLACEMENT ([facility] HX)    . PERIPHERAL VASCULAR CATHETERIZATION N/A 04/28/2016   Procedure: Glori Luis Cath Insertion;  Surgeon: Algernon Huxley, MD;  Location: [facility];  Service: Cardiovascular;  Laterality: N/A;   Social History  Substance Use Topics  . Smoking status: Current Every Day Smoker    Packs/day: 0.25    Types: Cigarettes  . Smokeless tobacco: Never Used     Comment: 1/2 PPD to 1PPD  . Alcohol use 0.0 oz/week     Comment: once a week 1/2 a beer to 1 beer   Family History  Problem Relation Age of Onset  . Breast cancer Mother   . Breast cancer Maternal Aunt   . Stomach cancer Paternal Uncle   . Leukemia Paternal Uncle     Allergies:  Allergies  Allergen Reactions  . Penicillins Hives and Other (See Comments)    Has patient had a PCN reaction causing immediate rash, facial/tongue/throat swelling, SOB or  lightheadedness with hypotension: No Has patient had a PCN reaction causing severe rash involving mucus membranes or skin necrosis: No Has patient had a PCN reaction that required hospitalization No Has patient had a PCN reaction occurring within the last 10 years: No If all of the above answers are "NO", then may proceed with Cephalosporin use.    Current antibiotics: Antibiotics Given (last 72 hours)    Date/Time Action Medication Dose Rate   09/07/16 2025 Given   levofloxacin (LEVAQUIN) IVPB 750 mg 750 mg 100 mL/hr   09/07/16 2217 Given   vancomycin (VANCOCIN) IVPB 1000 mg/200 mL premix 1,000 mg 200 mL/hr   09/08/16 1717 Given   levofloxacin (LEVAQUIN) IVPB 500 mg 500 mg 100 mL/hr   09/08/16 2147 Given  [med not available]   vancomycin (VANCOCIN) 500 mg in sodium chloride 0.9 % 100 mL IVPB 500 mg 100 mL/hr   09/09/16 1212 Given   vancomycin (VANCOCIN) 500 mg in sodium chloride 0.9 % 100 mL IVPB 500 mg 100 mL/hr      MEDICATIONS: . acetaminophen  650 mg Oral Once  . dexamethasone  2 mg Oral Q12H  . docusate sodium  100 mg Oral BID  . enoxaparin (LOVENOX) injection  30 mg Subcutaneous Q24H  . levofloxacin (LEVAQUIN) IV  500 mg Intravenous Q24H  . megestrol  200 mg Oral Daily  . nicotine  21 mg Transdermal Daily  . pantoprazole  40 mg Oral Daily  . potassium chloride  40 mEq Oral BID  . protein supplement shake  11 oz Oral Q24H  . sodium chloride flush  3 mL Intravenous Q12H    Review of Systems - 11 systems reviewed and negative per HPI   OBJECTIVE: Temp:  [97.6 F (36.4 C)-99.6 F (37.6 C)] 97.6 F (36.4 C) (10/19 1141) Pulse Rate:  [98-124] 98 (10/19 1141) Resp:  [17] 17 (10/19 1141) BP: (110-126)/(58-71) 110/61 (10/19 1141) SpO2:  [97 %-99 %] 99 % (10/19 1141) Weight:  [45.7 kg (100 lb 12.8 oz)] 45.7 kg (100 lb 12.8 oz) (10/19 1058) Physical Exam  Constitutional:  oriented to person, place, and time. Frail, ill appearing HENT: [employer]/AT, PERRLA, no scleral  icterus Mouth/Throat: Oropharynx is clear and dry. No oropharyngeal exudate.  Cardiovascular: Normal rate, regular rhythm and normal heart sounds.  \Pulmonary/Chest: poor air movement  Neck = supple, no nuchal rigidity Abdominal: Soft. Bowel sounds are normal.  exhibits no distension. There is no tenderness.  Lymphadenopathy: no cervical adenopathy. No axillary adenopathy Neurological: alert and oriented to person, place, and time.  Skin: Skin is warm and dry. No rash noted. No erythema.  Psychiatric: a normal mood and affect.  behavior is normal.   LABS: Results for orders placed or performed during the hospital encounter of 09/07/16 (from the past 48 hour(s))  Urinalysis complete, with microscopic ([facility] only)     Status: Abnormal   Collection Time: 09/07/16  7:50 PM  Result Value Ref Range   Color, Urine YELLOW (A) YELLOW   APPearance CLEAR (A) CLEAR   Glucose, UA NEGATIVE NEGATIVE mg/dL   Bilirubin Urine NEGATIVE NEGATIVE   Ketones, ur NEGATIVE NEGATIVE mg/dL   Specific Gravity, Urine 1.011 1.005 - 1.030   Hgb urine dipstick NEGATIVE NEGATIVE   pH 6.0 5.0 - 8.0   Protein, ur 30 (A) NEGATIVE mg/dL   Nitrite NEGATIVE NEGATIVE   Leukocytes, UA NEGATIVE NEGATIVE   RBC / HPF NONE SEEN 0 - 5 RBC/hpf   WBC, UA 0-5 0 - 5 WBC/hpf   Bacteria, UA NONE SEEN NONE SEEN   Squamous Epithelial / LPF NONE SEEN NONE SEEN   Mucous PRESENT   Lactic acid, plasma     Status: None   Collection Time: 09/07/16  9:21 PM  Result Value Ref Range   Lactic Acid, Venous 1.5 0.5 - 1.9 mmol/L  Basic metabolic panel     Status: Abnormal   Collection Time: 09/08/16  5:43 AM  Result Value Ref Range   Sodium 134 (L) 135 - 145 mmol/L   Potassium 6.0 (H) 3.5 - 5.1 mmol/L   Chloride 107 101 - 111 mmol/L   CO2 23 22 - 32 mmol/L   Glucose, Bld 97 65 - 99 mg/dL   BUN 9 6 - 20 mg/dL   Creatinine, Ser 0.58 0.44 - 1.00 mg/dL   Calcium 7.3 (L) 8.9 - 10.3 mg/dL   GFR calc non Af Amer >60 >60 mL/min   GFR calc Af  Amer >60 >60 mL/min    Comment: (NOTE) The eGFR has been calculated using the CKD EPI equation. This calculation has not been validated in all clinical situations. eGFR's persistently <60 mL/min signify possible Chronic Kidney Disease.    Anion gap 4 (L) 5 - 15  CBC     Status: Abnormal   Collection Time: 09/08/16  5:43 AM  Result Value Ref Range   WBC 17.5 (H) 3.6 - 11.0 K/uL   RBC 2.93 (L) 3.80 - 5.20 MIL/uL   Hemoglobin 8.8 (L) 12.0 - 16.0 g/dL    Comment: RESULT REPEATED AND VERIFIED   HCT 26.2 (L) 35.0 - 47.0 %   MCV 89.6 80.0 - 100.0 fL    Comment: RESULT REPEATED AND VERIFIED   MCH 30.1 26.0 - 34.0 pg   MCHC 33.5 32.0 - 36.0 g/dL   RDW 20.0 (H) 11.5 - 14.5 %   Platelets 75 (L) 150 - 440 K/uL  Potassium     Status: Abnormal   Collection Time: 09/08/16 12:10 PM  Result Value Ref Range   Potassium 3.0 (L) 3.5 - 5.1 mmol/L  MRSA PCR Screening     Status: None   Collection Time: 09/08/16  3:09 PM  Result Value Ref Range   MRSA by PCR NEGATIVE NEGATIVE    Comment:        The GeneXpert MRSA Assay (FDA approved for NASAL specimens only), is one component of a comprehensive MRSA colonization surveillance program. It is not intended to diagnose MRSA infection nor to guide or monitor treatment for MRSA infections.   CBC     Status: Abnormal   Collection Time: 09/09/16  5:00 AM  Result Value Ref Range   WBC 22.1 (H) 3.6 - 11.0 K/uL   RBC 3.02 (L) 3.80 - 5.20 MIL/uL   Hemoglobin 9.0 (L) 12.0 - 16.0 g/dL   HCT 27.1 (L) 35.0 - 47.0 %   MCV 89.6 80.0 - 100.0 fL   MCH 29.9 26.0 - 34.0 pg   MCHC 33.4 32.0 - 36.0 g/dL   RDW 20.1 (H) 11.5 - 14.5 %   Platelets 70 (L) 150 - 440 K/uL  Basic metabolic panel     Status: Abnormal   Collection Time: 09/09/16  5:00 AM  Result Value Ref Range   Sodium 133 (L) 135 - 145 mmol/L   Potassium 2.9 (L) 3.5 - 5.1 mmol/L   Chloride 99 (L) 101 - 111 mmol/L   CO2 27 22 - 32 mmol/L   Glucose, Bld 117 (H) 65 - 99 mg/dL   BUN 10 6 - 20 mg/dL    Creatinine, Ser 0.64 0.44 - 1.00 mg/dL   Calcium 7.5 (L) 8.9 - 10.3 mg/dL   GFR calc non Af Amer >60 >60 mL/min   GFR calc Af Amer >60 >60 mL/min    Comment: (NOTE) The eGFR has been calculated using the CKD EPI equation. This calculation has not been validated in all clinical situations. eGFR's persistently <60 mL/min signify possible Chronic Kidney Disease.    Anion gap 7 5 - 15  Magnesium     Status: Abnormal   Collection Time: 09/09/16  5:00 AM  Result Value Ref Range   Magnesium 1.6 (L) 1.7 - 2.4 mg/dL  Cortisol     Status: None   Collection Time: 09/09/16  7:50 AM  Result Value Ref Range   Cortisol, Plasma 19.0 ug/dL    Comment: (NOTE) AM    6.7 - 22.6 ug/dL PM   <10.0       ug/dL Performed at [facility]   Culture, expectorated sputum-assessment     Status: None   Collection Time: 09/09/16 12:19 PM  Result Value Ref Range   Specimen Description EXPECTORATED SPUTUM    Special Requests Normal    Sputum evaluation      Sputum specimen not acceptable for testing.  Please recollect.     Report Status 09/09/2016 FINAL   Acid Fast Smear (AFB)     Status: None   Collection Time: 09/09/16 12:19 PM  Result Value Ref Range   AFB Specimen Processing      Sputum specimen not acceptable for testing.  Please recollect.      Comment: CALLED ABIGAIL JACKSON '10 19 17 ' 1340 SDR   Source (AFB) SPUTUM   Acid Fast Culture with reflexed sensitivities     Status: None   Collection Time: 09/09/16 12:19 PM  Result Value Ref Range   Acid Fast Culture      Sputum specimen not acceptable for testing.  Please recollect.      Comment: CALLED ABIGAIL JACKSON '10 19 17 ' 1340 SDR   Source of Sample SPUTUM    No components found for: ESR, C REACTIVE PROTEIN MICRO: Recent Results (from the past 720 hour(s))  Urine culture     Status: None   Collection Time: 08/30/16  1:13 PM  Result Value Ref Range Status   Specimen Description URINE, CLEAN CATCH  Final   Special Requests NONE  Final    Culture NO GROWTH Performed at [facility]   Final   Report Status 08/31/2016 FINAL  Final  Culture, blood (routine x 2)     Status: None   Collection Time: 09/01/16  1:18 PM  Result Value Ref Range Status   Specimen Description BLOOD PORTA CATH RIGHT  Final   Special Requests   Final    BOTTLES DRAWN AEROBIC AND ANAEROBIC [place] AEROBIC,11CC ANAEROBIC   Culture NO GROWTH 5 DAYS  Final   Report Status 09/06/2016 FINAL  Final  Culture, blood (routine x 2)     Status: None   Collection Time: 09/01/16  1:27 PM  Result Value Ref Range Status   Specimen Description BLOOD PORTA CATH RIGHT  Final   Special Requests   Final    BOTTLES DRAWN AEROBIC AND ANAEROBIC 10CC AEROBIC, [place] ANAEROBIC   Culture NO GROWTH 5 DAYS  Final   Report Status 09/06/2016 FINAL  Final  Culture, blood (x 2)     Status: None (Preliminary result)   Collection Time: 09/07/16  2:26 PM  Result Value Ref Range Status   Specimen Description BLOOD RIGHT ANTECUBITAL  Final   Special Requests   Final    BOTTLES DRAWN AEROBIC AND ANAEROBIC  AER 1CC ANA 5CC   Culture NO GROWTH 2 DAYS  Final   Report Status PENDING  Incomplete  Culture, blood (x 2)     Status: None (Preliminary result)   Collection Time: 09/07/16  2:26 PM  Result Value Ref Range Status   Specimen Description BLOOD LEFT ANTECUBITAL  Final   Special Requests   Final    BOTTLES DRAWN AEROBIC AND ANAEROBIC  AER 3CC ANA 6CC   Culture NO GROWTH 2 DAYS  Final   Report Status PENDING  Incomplete  MRSA PCR Screening     Status: None   Collection Time: 09/08/16  3:09 PM  Result Value Ref Range Status   MRSA by PCR NEGATIVE NEGATIVE Final    Comment:        The GeneXpert MRSA Assay (FDA approved for NASAL specimens only), is one component of a comprehensive MRSA colonization surveillance program. It is not intended to diagnose MRSA infection nor to guide or monitor treatment for MRSA infections.   Culture, expectorated sputum-assessment      Status: None   Collection Time:  09/09/16 12:19 PM  Result Value Ref Range Status   Specimen Description EXPECTORATED SPUTUM  Final   Special Requests Normal  Final   Sputum evaluation   Final    Sputum specimen not acceptable for testing.  Please recollect.     Report Status 09/09/2016 FINAL  Final  Acid Fast Smear (AFB)     Status: None   Collection Time: 09/09/16 12:19 PM  Result Value Ref Range Status   AFB Specimen Processing   Final    Sputum specimen not acceptable for testing.  Please recollect.      Comment: CALLED ABIGAIL JACKSON '10 19 17 ' 1340 SDR   Source (AFB) SPUTUM  Final  Acid Fast Culture with reflexed sensitivities     Status: None   Collection Time: 09/09/16 12:19 PM  Result Value Ref Range Status   Acid Fast Culture   Final    Sputum specimen not acceptable for testing.  Please recollect.      Comment: CALLED ABIGAIL JACKSON '10 19 17 ' 1340 SDR   Source of Sample SPUTUM  Final    IMAGING: Dg Chest 2 View  Result Date: 08/30/2016 CLINICAL DATA:  Squamous cell carcinoma of left lung.  Cough. EXAM: CHEST  2 VIEW COMPARISON:  Radiographs of July 29, 2016. FINDINGS: No pneumothorax or pleural effusion is noted. Right internal jugular Port-A-Cath is unchanged in position with distal tip in expected position of cavoatrial junction. Right lung is clear. Cardiomediastinal silhouette appears normal. Continued presence of large cavitary mass seen in expected position of superior segment of left lower lobe which is not significantly changed compared to prior exam. Bony thorax is unremarkable. IMPRESSION: Right internal jugular Port-A-Cath in grossly good position. Large cavitary mass with air-fluid level seen in expected position of superior segment of left lower lobe consistent with malignancy or possibly abscess. Electronically Signed   By: Marijo Conception, M.D.   On: 08/30/2016 15:30   Ct Chest Wo Contrast  Result Date: 09/06/2016 CLINICAL DATA:  Squamous cell carcinoma  LEFT lung. EXAM: CT CHEST WITHOUT CONTRAST TECHNIQUE: Multidetector CT imaging of the chest was performed following the standard protocol without IV contrast. COMPARISON:  CT 07/05/2016, PET-CT 04/14/2016 FINDINGS: Cardiovascular: No significant vascular findings. Normal heart size. No pericardial effusion. Mediastinum/Nodes: No axillary supraclavicular adenopathy. No adenopathy identified on this noncontrast exam. Lungs/Pleura: There is interval increase in nodularity along the superior aspect of the cavitary lesion LEFT upper lobe. For example 2.9 by 2.6 cm nodular mass is increased from 2.2 x 1.0 cm (image 45, series 3). Increased nodular consolidation within the LEFT lower lobe with 4.1 by 3.1 cm mass increased from 2.6 by 2.0 cm nodule (image 70, series 3). Several small sub cm nodules in the RIGHT middle lobe (Image 81 and 87 of series 3) not seen on prior. Nodule pleural parenchymal thickening at the RIGHT lung base (image 122, series 3 is similar). Upper Abdomen: Metastasis are not well demonstrated on this noncontrast exam. There is suggestive of progression of the LEFT hepatic lobe lesion measuring 6.5 cm increased from 3.4 cm. Renal metastasis not well demonstrated Musculoskeletal: No aggressive osseous lesion. IMPRESSION: 1. Recurrence / progression of lung cancer with increased nodular and masslike consolidation in the LEFT upper lobe and LEFT lower lobe. 2. Several new small nodules in the RIGHT middle lobe. 3. Progression of hepatic metastasis. 4. Renal metastatic lesions not well demonstrated. Electronically Signed   By: Suzy Bouchard M.D.   On: 09/06/2016 13:54   Dg  Ugi W/high Density W/kub  Result Date: 09/08/2016 CLINICAL DATA:  Nausea vomiting after eating. EXAM: UPPER GI SERIES WITH KUB TECHNIQUE: After obtaining a scout radiograph a routine upper GI series was performed using thin FLUOROSCOPY TIME:  Fluoroscopy Time:  3 minutes 9 seconds Radiation Exposure Index (if provided by the  fluoroscopic device): 37.7 mGy Number of Acquired Spot Images: 14. COMPARISON:  CT 07/05/2016. FINDINGS: The IVC filter noted with its tip at the L2-L3 level. PowerPort catheter noted over the chest. The esophagus is widely patent. Small hiatal hernia is noted. Severe gastroesophageal reflux present. Stomach and duodenum appear normal. Exam was limited due to the patient's clinical condition. IMPRESSION: Severe gastroesophageal reflux.  Exam is otherwise unremarkable . Electronically Signed   By: Marcello Moores  Register   On: 09/08/2016 08:29    Assessment:   Christina Mckee is a 55 y.o. female with metastatic lung cancer, with liver mets and now cavitation and possible superinfection of her lung cancer lesions. I have very low suspicion for TB and think she can be dced home as she is anxious to leave the hospital.  Cultures are pending.  Recommendations I would give her 5 more days of levofloxacin and follow cultures. Can adjust abx if anything resistant grows.  I can see as otpt if worsens. Thank you very much for allowing me to participate in the care of this patient. Please call with questions.   Cheral Marker. Ola Spurr, MD

## 2016-09-09 NOTE — Progress Notes (Signed)
Completed the Expedited Adverse Event Report (AERS) and reviewed with Dr. Mike Gip for approval. Submitted @ 3:30pm today.  Mirian Mo, RN, BSN 09/09/2016 3:32 PM

## 2016-09-09 NOTE — Progress Notes (Signed)
[place] [facility] [place] NAME: Christina Mckee    MR#:  [id number]  DATE OF BIRTH:  [birth year]  SUBJECTIVE:  CHIEF COMPLAINT: sent with generalized weakness, nausea and vomiting.  REVIEW OF SYSTEMS:  CONSTITUTIONAL: No fever, fatigue or weakness.  EYES: No blurred or double vision.  EARS, NOSE, AND THROAT: No tinnitus or ear pain.  RESPIRATORY: No cough, shortness of breath, wheezing or hemoptysis.  CARDIOVASCULAR: No chest pain, orthopnea, edema.  GASTROINTESTINAL: positive nausea, vomiting, no diarrhea or abdominal pain.  GENITOURINARY: No dysuria, hematuria.  ENDOCRINE: No polyuria, nocturia,  HEMATOLOGY: No anemia, easy bruising or bleeding SKIN: No rash or lesion. MUSCULOSKELETAL: No joint pain or arthritis.   NEUROLOGIC: No tingling, numbness, weakness.  PSYCHIATRY: No anxiety or depression.   ROS  DRUG ALLERGIES:   Allergies  Allergen Reactions  . Penicillins Hives and Other (See Comments)    Has patient had a PCN reaction causing immediate rash, facial/tongue/throat swelling, SOB or lightheadedness with hypotension: No Has patient had a PCN reaction causing severe rash involving mucus membranes or skin necrosis: No Has patient had a PCN reaction that required hospitalization No Has patient had a PCN reaction occurring within the last 10 years: No If all of the above answers are "NO", then may proceed with Cephalosporin use.    VITALS:  Blood pressure 117/71, pulse (!) 107, temperature 98.5 F (36.9 C), temperature source Oral, resp. rate 18, height '5\' 7"'$  (1.702 m), weight 40.7 kg (89 lb 11.2 oz), last menstrual period 04/14/2004, SpO2 98 %.  PHYSICAL EXAMINATION:  GENERAL:  55 y.o.-year-old  Thin patient lying in the bed with no acute distress.  EYES: Pupils equal, round, reactive to light and accommodation. No scleral icterus. Extraocular muscles intact. Conjunctiva pale. HEENT: Head atraumatic, normocephalic. Oropharynx and  nasopharynx clear.  NECK:  Supple, no jugular venous distention. No thyroid enlargement, no tenderness.  LUNGS: Normal breath sounds bilaterally, no wheezing, rales,rhonchi or crepitation. No use of accessory muscles of respiration.  CARDIOVASCULAR: S1, S2 normal. No murmurs, rubs, or gallops.  ABDOMEN: Soft, nontender, nondistended. Bowel sounds present. No organomegaly or mass.  EXTREMITIES: No pedal edema, cyanosis, or clubbing.  NEUROLOGIC: Cranial nerves II through XII are intact. Muscle strength 5/5 in all extremities. Sensation intact. Gait not checked.  PSYCHIATRIC: The patient is alert and oriented x 3.  SKIN: No obvious rash, lesion, or ulcer.   Physical Exam LABORATORY PANEL:   CBC  Recent Labs Lab 09/09/16 0500  WBC 22.1*  HGB 9.0*  HCT 27.1*  PLT 70*   ------------------------------------------------------------------------------------------------------------------  Chemistries   Recent Labs Lab 09/07/16 1414  09/09/16 0500  NA 132*  < > 133*  K 3.0*  < > 2.9*  CL 99*  < > 99*  CO2 20*  < > 27  GLUCOSE 139*  < > 117*  BUN 10  < > 10  CREATININE 0.77  < > 0.64  CALCIUM 8.6*  < > 7.5*  MG 1.3*  --  1.6*  AST 30  --   --   ALT 21  --   --   ALKPHOS 135*  --   --   BILITOT 0.5  --   --   < > = values in this interval not displayed. ------------------------------------------------------------------------------------------------------------------  Cardiac Enzymes No results for input(s): TROPONINI in the last 168 hours. ------------------------------------------------------------------------------------------------------------------  RADIOLOGY:  Dg Ugi W/high Density W/kub  Result Date: 09/08/2016 CLINICAL DATA:  Nausea vomiting after eating. EXAM:  UPPER GI SERIES WITH KUB TECHNIQUE: After obtaining a scout radiograph a routine upper GI series was performed using thin FLUOROSCOPY TIME:  Fluoroscopy Time:  3 minutes 9 seconds Radiation Exposure Index (if  provided by the fluoroscopic device): 37.7 mGy Number of Acquired Spot Images: 14. COMPARISON:  CT 07/05/2016. FINDINGS: The IVC filter noted with its tip at the L2-L3 level. PowerPort catheter noted over the chest. The esophagus is widely patent. Small hiatal hernia is noted. Severe gastroesophageal reflux present. Stomach and duodenum appear normal. Exam was limited due to the patient's clinical condition. IMPRESSION: Severe gastroesophageal reflux.  Exam is otherwise unremarkable . Electronically Signed   By: Marcello Moores  Register   On: 09/08/2016 08:29    ASSESSMENT AND PLAN:   Active Problems:   Sepsis ([place])   Intractable vomiting with nausea   DNR (do not resuscitate) discussion   Palliative care encounter   Weakness generalized   Protein-calorie malnutrition, severe  * Generalized weaknees   Nausea and vomiting   Symptomatic anemia of chronic disease    S/p 2 units transfusion, no active bleed   Hb came up.   Appreciated GI consult, no procedures likely   Upepr GI series.   Symptomatic help for now.  * Cavitary lung lesion, pneumonia   Have progression of Lung cancer   Metastatic squamous cell lung cancer      Oncology on case   Appreciated Thorasic sx consult- no need for surgery, treat infection.   On levaquin for now.  * Severe malnutrition   Electrolyte imbalance   Hypokalemia , hypomagnesemia      Due to chronic difficulty in eating and nausea   GI on case for further work ups.   IV fluids, replaced potassium, it was high after replacement so given kayexalate.   Recheck.     All the records are reviewed and case discussed with Care Management/Social Workerr. Management plans discussed with the patient, family and they are in agreement.  CODE STATUS: full  TOTAL TIME TAKING CARE OF THIS PATIENT: 35 minutes.     POSSIBLE D/C IN 2-3 DAYS, DEPENDING ON CLINICAL CONDITION.   Vaughan Basta M.D on 09/09/2016   Between 7am to 6pm - Pager -  [phone]  After 6pm go to [email or web address] - password [email or web address] [facility]  Office  [phone]  CC: Primary care physician; No PCP Per Patient  Note: This dictation was prepared with Dragon dictation along with smaller phrase technology. Any transcriptional errors that result from this process are unintentional.

## 2016-09-09 NOTE — Discharge Summary (Signed)
[facility] [place] NAME: Christina Mckee    MR#:  [id number]  DATE OF BIRTH:  1960-11-27  DATE OF ADMISSION:  09/07/2016 ADMITTING PHYSICIAN: Demetrios Loll, MD  DATE OF DISCHARGE: 09/09/2016  PRIMARY CARE PHYSICIAN: No PCP Per Patient    ADMISSION DIAGNOSIS:  persistent N V anemia failure to thrive lung ca  DISCHARGE DIAGNOSIS:  Active Problems:   Intractable vomiting with nausea   DNR (do not resuscitate) discussion   Palliative care encounter   Weakness generalized   Protein-calorie malnutrition, severe   GERD  SECONDARY DIAGNOSIS:   Past Medical History:  Diagnosis Date  . DVT (deep venous thrombosis) ([place])    in 2015  . Menopause    prior to 2014  . Squamous cell lung cancer [facility])     HOSPITAL COURSE:   * Generalized weaknees   Nausea and vomiting   Symptomatic anemia of chronic disease    S/p 2 units transfusion, no active bleed   Hb came up.   Appreciated GI consult, no procedures likely   Upepr GI series Showed GERD   Symptomatic help for now.   Added PPI twice a day and Reglan. Follow-up in GI clinic.  * Cavitary lung lesion, pneumonia   Have progression of Lung cancer   Metastatic squamous cell lung cancer   Suspected sepsis on admission but ruled out.      Oncology on case   Appreciated Thorasic sx consult- no need for surgery, treat infection.   On levaquin for now.   Appreciated infectious disease help, and less likely to have any other serious infection and suggested to give levofloxacin for 5 more days.  * Severe malnutrition   Electrolyte imbalance   Hypokalemia , hypomagnesemia      Due to chronic difficulty in eating and nausea   GI on case for further work ups.   IV fluids, replaced potassium, it was high after replacement so given kayexalate.   Recheck came over, replaced again.   DISCHARGE CONDITIONS:   Stable  CONSULTS OBTAINED:  Treatment Team:  Nestor Lewandowsky,  MD Lequita Asal, MD [name], MD Leonel Ramsay, MD  DRUG ALLERGIES:   Allergies  Allergen Reactions  . Penicillins Hives and Other (See Comments)    Has patient had a PCN reaction causing immediate rash, facial/tongue/throat swelling, SOB or lightheadedness with hypotension: No Has patient had a PCN reaction causing severe rash involving mucus membranes or skin necrosis: No Has patient had a PCN reaction that required hospitalization No Has patient had a PCN reaction occurring within the last 10 years: No If all of the above answers are "NO", then may proceed with Cephalosporin use.    DISCHARGE MEDICATIONS:   Current Discharge Medication List    START taking these medications   Details  pantoprazole (PROTONIX) 40 MG tablet Take 1 tablet (40 mg total) by mouth 2 (two) times daily. Qty: 60 tablet, Refills: 0   Associated Diagnoses: Nausea and vomiting, intractability of vomiting not specified, unspecified vomiting type; Gastroesophageal reflux disease, esophagitis presence not specified      CONTINUE these medications which have CHANGED   Details  levofloxacin (LEVAQUIN) 500 MG tablet Take 1 tablet (500 mg total) by mouth daily. Qty: 5 tablet, Refills: 0    metoCLOPramide (REGLAN) 5 MG tablet Take 1 tablet (5 mg total) by mouth 3 (three) times daily before meals. Qty: 30 tablet, Refills: 1      CONTINUE  these medications which have NOT CHANGED   Details  cyclobenzaprine (FLEXERIL) 5 MG tablet Take 1 tablet (5 mg total) by mouth 3 (three) times daily as needed for muscle spasms. Qty: 20 tablet, Refills: 0    guaifenesin (ROBITUSSIN) 100 MG/5ML syrup Take 200 mg by mouth 3 (three) times daily as needed for cough or congestion.    ibuprofen (ADVIL,MOTRIN) 200 MG tablet Take by mouth every 6 (six) hours as needed.     KLOR-CON 10 10 MEQ tablet TAKE 3 TABS BY MOUTH IN THE MORNING AND 2 TABS BY MOUTH IN THE EVENING Qty: 150 tablet, Refills: 0   Associated  Diagnoses: Squamous cell lung cancer, left (HCC)    lidocaine-prilocaine (EMLA) cream Apply 1 application topically as needed. Qty: 30 g, Refills: 6    megestrol (MEGACE) 400 MG/10ML suspension Take 5 mLs (200 mg total) by mouth daily. Qty: 150 mL, Refills: 1   Associated Diagnoses: Squamous cell lung cancer, left ([place]); Liver metastasis ([place]); Cancer, metastatic to bone [facility]); Hyponatremia; Hypokalemia; Leukocytosis; Weight loss; Back pain without sciatica    Multiple Vitamin (MULTIVITAMIN) tablet Take 1 tablet by mouth daily.    nicotine (NICODERM CQ - DOSED IN MG/24 HOURS) 21 mg/24hr patch Place 1 patch (21 mg total) onto the skin daily. Qty: 28 patch, Refills: 0    traMADol (ULTRAM) 50 MG tablet Take 1 tablet (50 mg total) by mouth every 6 (six) hours as needed. Qty: 60 tablet, Refills: 0    guaiFENesin-codeine (CHERATUSSIN AC) 100-10 MG/5ML syrup Take 5 mLs by mouth 3 (three) times daily as needed for cough. Qty: 120 mL, Refills: 0    ondansetron (ZOFRAN) 8 MG tablet Take 1 tablet (8 mg total) by mouth every 8 (eight) hours as needed for nausea or vomiting. Qty: 60 tablet, Refills: 1    prochlorperazine (COMPAZINE) 10 MG tablet Take 1 tablet (10 mg total) by mouth every 6 (six) hours as needed for nausea or vomiting. Qty: 30 tablet, Refills: 6      STOP taking these medications     GuaiFENesin (MUCINEX PO)          DISCHARGE INSTRUCTIONS:   Follow with GI clinic.  If you experience worsening of your admission symptoms, develop shortness of breath, life threatening emergency, suicidal or homicidal thoughts you must seek medical attention immediately by calling 911 or calling your MD immediately  if symptoms less severe.  You Must read complete instructions/literature along with all the possible adverse reactions/side effects for all the Medicines you take and that have been prescribed to you. Take any new Medicines after you have completely understood and accept all the  possible adverse reactions/side effects.   Please note  You were cared for by a hospitalist during your hospital stay. If you have any questions about your discharge medications or the care you received while you were in the hospital after you are discharged, you can call the unit and asked to speak with the hospitalist on call if the hospitalist that took care of you is not available. Once you are discharged, your primary care physician will handle any further medical issues. Please note that NO REFILLS for any discharge medications will be authorized once you are discharged, as it is imperative that you return to your primary care physician (or establish a relationship with a primary care physician if you do not have one) for your aftercare needs so that they can reassess your need for medications and monitor your lab values.  Today   CHIEF COMPLAINT:  No chief complaint on file.   HISTORY OF PRESENT ILLNESS:  Christina Mckee  is a 55 y.o. female with a known history of Lung cancer with metastasis and DVT. The patient is sent by Dr. Mike Gip for direct admission. She complained of chills, nausea, vomiting, weight loss, poor oral intake and generalized weakness for the past one month. She underwent workup, which show WBC 25, hemoglobin 6.4, potassium 2.8 and sodium 131. In addition, chest x-ray shows Large cavitary mass with air-fluid level.    VITAL SIGNS:  Blood pressure 110/61, pulse 98, temperature 97.6 F (36.4 C), resp. rate 17, height '5\' 7"'$  (1.702 m), weight 45.7 kg (100 lb 12.8 oz), last menstrual period 04/14/2004, SpO2 99 %.  I/O:   Intake/Output Summary (Last 24 hours) at 09/09/16 1548 Last data filed at 09/09/16 1300  Gross per 24 hour  Intake              400 ml  Output                0 ml  Net              400 ml    PHYSICAL EXAMINATION:   GENERAL:  55 y.o.-year-old  Thin patient lying in the bed with no acute distress.  EYES: Pupils equal, round, reactive to light and  accommodation. No scleral icterus. Extraocular muscles intact. Conjunctiva pale. HEENT: Head atraumatic, normocephalic. Oropharynx and nasopharynx clear.  NECK:  Supple, no jugular venous distention. No thyroid enlargement, no tenderness.  LUNGS: Normal breath sounds bilaterally, no wheezing, rales,rhonchi or crepitation. No use of accessory muscles of respiration.  CARDIOVASCULAR: S1, S2 normal. No murmurs, rubs, or gallops.  ABDOMEN: Soft, nontender, nondistended. Bowel sounds present. No organomegaly or mass.  EXTREMITIES: No pedal edema, cyanosis, or clubbing.  NEUROLOGIC: Cranial nerves II through XII are intact. Muscle strength 5/5 in all extremities. Sensation intact. Gait not checked.  PSYCHIATRIC: The patient is alert and oriented x 3.  SKIN: No obvious rash, lesion, or ulcer.   DATA REVIEW:   CBC  Recent Labs Lab 09/09/16 0500  WBC 22.1*  HGB 9.0*  HCT 27.1*  PLT 70*    Chemistries   Recent Labs Lab 09/07/16 1414  09/09/16 0500  NA 132*  < > 133*  K 3.0*  < > 2.9*  CL 99*  < > 99*  CO2 20*  < > 27  GLUCOSE 139*  < > 117*  BUN 10  < > 10  CREATININE 0.77  < > 0.64  CALCIUM 8.6*  < > 7.5*  MG 1.3*  --  1.6*  AST 30  --   --   ALT 21  --   --   ALKPHOS 135*  --   --   BILITOT 0.5  --   --   < > = values in this interval not displayed.  Cardiac Enzymes No results for input(s): TROPONINI in the last 168 hours.  Microbiology Results  Results for orders placed or performed during the hospital encounter of 09/07/16  Culture, blood (x 2)     Status: None (Preliminary result)   Collection Time: 09/07/16  2:26 PM  Result Value Ref Range Status   Specimen Description BLOOD RIGHT ANTECUBITAL  Final   Special Requests   Final    BOTTLES DRAWN AEROBIC AND ANAEROBIC  AER 1CC ANA 5CC   Culture NO GROWTH 2 DAYS  Final   Report Status PENDING  Incomplete  Culture, blood (x 2)     Status: None (Preliminary result)   Collection Time: 09/07/16  2:26 PM  Result Value  Ref Range Status   Specimen Description BLOOD LEFT ANTECUBITAL  Final   Special Requests   Final    BOTTLES DRAWN AEROBIC AND ANAEROBIC  AER 3CC ANA 6CC   Culture NO GROWTH 2 DAYS  Final   Report Status PENDING  Incomplete  MRSA PCR Screening     Status: None   Collection Time: 09/08/16  3:09 PM  Result Value Ref Range Status   MRSA by PCR NEGATIVE NEGATIVE Final    Comment:        The GeneXpert MRSA Assay (FDA approved for NASAL specimens only), is one component of a comprehensive MRSA colonization surveillance program. It is not intended to diagnose MRSA infection nor to guide or monitor treatment for MRSA infections.   Culture, expectorated sputum-assessment     Status: None   Collection Time: 09/09/16 12:19 PM  Result Value Ref Range Status   Specimen Description EXPECTORATED SPUTUM  Final   Special Requests Normal  Final   Sputum evaluation   Final    Sputum specimen not acceptable for testing.  Please recollect.     Report Status 09/09/2016 FINAL  Final  Acid Fast Smear (AFB)     Status: None   Collection Time: 09/09/16 12:19 PM  Result Value Ref Range Status   AFB Specimen Processing   Final    Sputum specimen not acceptable for testing.  Please recollect.      Comment: CALLED ABIGAIL JACKSON '10 19 17 '$ 1340 SDR   Source (AFB) SPUTUM  Final  Acid Fast Culture with reflexed sensitivities     Status: None   Collection Time: 09/09/16 12:19 PM  Result Value Ref Range Status   Acid Fast Culture   Final    Sputum specimen not acceptable for testing.  Please recollect.      Comment: CALLED ABIGAIL JACKSON '10 19 17 '$ 1340 SDR   Source of Sample SPUTUM  Final    RADIOLOGY:  Dg Ugi W/high Density W/kub  Result Date: 09/08/2016 CLINICAL DATA:  Nausea vomiting after eating. EXAM: UPPER GI SERIES WITH KUB TECHNIQUE: After obtaining a scout radiograph a routine upper GI series was performed using thin FLUOROSCOPY TIME:  Fluoroscopy Time:  3 minutes 9 seconds Radiation Exposure  Index (if provided by the fluoroscopic device): 37.7 mGy Number of Acquired Spot Images: 14. COMPARISON:  CT 07/05/2016. FINDINGS: The IVC filter noted with its tip at the L2-L3 level. PowerPort catheter noted over the chest. The esophagus is widely patent. Small hiatal hernia is noted. Severe gastroesophageal reflux present. Stomach and duodenum appear normal. Exam was limited due to the patient's clinical condition. IMPRESSION: Severe gastroesophageal reflux.  Exam is otherwise unremarkable . Electronically Signed   By: Marcello Moores  Register   On: 09/08/2016 08:29    EKG:   Orders placed or performed in visit on 08/30/16  . EKG 12-Lead      Management plans discussed with the patient, family and they are in agreement.  CODE STATUS:     Code Status Orders        Start     Ordered   09/07/16 1327  Full code  Continuous     09/07/16 1326    Code Status History    Date Active Date Inactive Code Status Order ID Comments User Context   03/05/2016  4:06 PM 03/06/2016  3:09  PM Full Code [id number]  Gladstone Lighter, MD Inpatient    Advance Directive Documentation   Flowsheet Row Most Recent Value  Type of Advance Directive  Healthcare Power of Attorney, Living will  Pre-existing out of facility DNR order (yellow form or pink MOST form)  No data  "MOST" Form in Place?  No data      TOTAL TIME TAKING CARE OF THIS PATIENT: 35 minutes.    Vaughan Basta M.D on 09/09/2016 at 3:48 PM  Between 7am to 6pm - Pager - [phone]  After 6pm go to [email or web address] - password [email or web address] [facility]  Office  [phone]  CC: Primary care physician; No PCP Per Patient   Note: This dictation was prepared with Dragon dictation along with smaller phrase technology. Any transcriptional errors that result from this process are unintentional.

## 2016-09-09 NOTE — Progress Notes (Signed)
Patient is discharge home in a stable condition, porta cath deactivated as order , summary and f/u care given , verbalized understanding , left with friends.

## 2016-09-09 NOTE — Progress Notes (Signed)
[facility] Progress Note  Date of admission: 09/07/2016  Hospital day:  09/09/2016  Please see note from yesterday.  Patient was unavailable yesterday morning as she was in radiology for an UGI study.  Patient was seen in the evening.   Lequita Asal, MD  09/09/2016

## 2016-09-10 ENCOUNTER — Encounter: Payer: Self-pay | Admitting: *Deleted

## 2016-09-10 ENCOUNTER — Telehealth: Payer: Self-pay | Admitting: *Deleted

## 2016-09-10 NOTE — Telephone Encounter (Signed)
Work will not allow pt to rtn without a letter stating it is ok.  Letter written and sent to her boss. Christina Mckee at fax # [phone]

## 2016-09-12 LAB — CULTURE, BLOOD (ROUTINE X 2)
Culture: NO GROWTH
Culture: NO GROWTH

## 2016-09-12 NOTE — Progress Notes (Signed)
[facility] day:  09/13/2016   Chief Complaint: Christina Mckee is a 55 y.o. female with metastatic squamous cell lung cancer who is seen for assessment after interval hospitalization.  HPI: The patient was last seen in the medical oncology clinic on 09/06/2016.  At that time, she continued to lose weight secondary to chronic nausea and vomiting. She had progressive anemia.  She had persistent hypokalemia.  She remained concerned about missing work.  A chest CT was ordered to fully assess the air fluid level in her left chest.  After some discussion, the patient agreed for admision to the hospital.  Chest CT on 09/06/2016 revealed recurrence / progression of lung cancer with increased nodular and masslike consolidation in the LEFT upper lobe and LEFT lower lobe.  There were several new small nodules in the RIGHT middle lobe.  There was progression of hepatic metastasis.  Renal metastatic lesions were not well demonstrated.  She was admitted to [facility] from 09/07/2016 - 09/09/2016.  She received 2 units of PRBCs.  She was seen by Dr. Genevive Mckee of pulmonary medicine.  She was not felt to be a surgical candidate.  UGI revealed reflux.  Electrolytes were replaced.  She was placed on PPI twice a day plus Reglan.  She was continued on Levaquin.  During the interim, she has had problems keeping food down.  She has been on Protonix secondary to reflux.  Reglan has helped with her nausea.  She has constipation. She notes pain in her abdomen that radiates into her pelvic area.  She is still working.   Past Medical History:  Diagnosis Date  . DVT (deep venous thrombosis) ([facility])    in 2015  . Menopause    prior to 2014  . Squamous cell lung cancer [facility])     Past Surgical History:  Procedure Laterality Date  . BREAST CYST EXCISION     left breast removal  . CESAREAN SECTION    . IVC FILTER PLACEMENT ([facility] HX)    . PERIPHERAL VASCULAR CATHETERIZATION N/A 04/28/2016   Procedure: Christina Mckee Cath Insertion;  Surgeon: Christina Huxley, MD;  Location: [facility];  Service: Cardiovascular;  Laterality: N/A;    Family History  Problem Relation Age of Onset  . Breast cancer Mother   . Breast cancer Maternal Aunt   . Stomach cancer Paternal Uncle   . Leukemia Paternal Uncle     Social History:  reports that she has been smoking Cigarettes.  She has been smoking about 0.25 packs per day. She has never used smokeless tobacco. She reports that she drinks alcohol. She reports that she does not use drugs.  She has smoked 1/2-1 pack a day since age 44. She has a 30-40 pack year smoking history.  She drinks 4 beers a day. She lives in [place] alone with her cats and dogs. She has a dog named Christina Mckee (lab and shepherd mix).  She previously worked at [employer], but notes all positions were recently eliminated. She continues to work.  Thursdays are better treatment days for her.  Her daughter got married on 08/01/2016.  She has to work until 09/22/2016.  She is accompanied by her sister, Christina Mckee, today.  Allergies:  Allergies  Allergen Reactions  . Penicillins Hives and Other (See Comments)    Has patient had a PCN reaction causing immediate rash, facial/tongue/throat swelling, SOB or lightheadedness with hypotension: No Has patient had a PCN reaction causing severe rash involving  mucus membranes or skin necrosis: No Has patient had a PCN reaction that required hospitalization No Has patient had a PCN reaction occurring within the last 10 years: No If all of the above answers are "NO", then may proceed with Cephalosporin use.    Current Medications: Current Outpatient Prescriptions  Medication Sig Dispense Refill  . cyclobenzaprine (FLEXERIL) 5 MG tablet Take 1 tablet (5 mg total) by mouth 3 (three) times daily as needed for muscle spasms. 20 tablet 0  . guaifenesin (ROBITUSSIN) 100 MG/5ML syrup Take 200 mg by mouth 3 (three) times daily as needed for cough or congestion.     . guaiFENesin-codeine (CHERATUSSIN AC) 100-10 MG/5ML syrup Take 5 mLs by mouth 3 (three) times daily as needed for cough. 120 mL 0  . ibuprofen (ADVIL,MOTRIN) 200 MG tablet Take by mouth every 6 (six) hours as needed.     . KLOR-CON 10 10 MEQ tablet TAKE 3 TABS BY MOUTH IN THE MORNING AND 2 TABS BY MOUTH IN THE EVENING 150 tablet 0  . lidocaine-prilocaine (EMLA) cream Apply 1 application topically as needed. 30 g 6  . megestrol (MEGACE) 400 MG/10ML suspension Take 5 mLs (200 mg total) by mouth daily. 150 mL 1  . metoCLOPramide (REGLAN) 5 MG tablet Take 1 tablet (5 mg total) by mouth 3 (three) times daily before meals. 30 tablet 1  . Multiple Vitamin (MULTIVITAMIN) tablet Take 1 tablet by mouth daily.    . nicotine (NICODERM CQ - DOSED IN MG/24 HOURS) 21 mg/24hr patch Place 1 patch (21 mg total) onto the skin daily. 28 patch 0  . ondansetron (ZOFRAN) 8 MG tablet Take 1 tablet (8 mg total) by mouth every 8 (eight) hours as needed for nausea or vomiting. 60 tablet 1  . pantoprazole (PROTONIX) 40 MG tablet Take 1 tablet (40 mg total) by mouth 2 (two) times daily. 60 tablet 0  . prochlorperazine (COMPAZINE) 10 MG tablet Take 1 tablet (10 mg total) by mouth every 6 (six) hours as needed for nausea or vomiting. 30 tablet 6  . traMADol (ULTRAM) 50 MG tablet Take 1 tablet (50 mg total) by mouth every 6 (six) hours as needed. 60 tablet 0  . KLOR-CON M10 10 MEQ tablet      No current facility-administered medications for this visit.    Facility-Administered Medications Ordered in Other Visits  Medication Dose Route Frequency Provider Last Rate Last Dose  . alteplase (CATHFLO ACTIVASE) injection 2 mg  2 mg Intracatheter Once Melissa C Corcoran, MD      . sodium chloride flush (NS) 0.9 % injection 10 mL  10 mL Intravenous PRN Melissa C Corcoran, MD   10 mL at 04/29/16 0906    Review of Systems:  GENERAL: Fatigue. No fevers or sweats. Feels "hot and cold". Weight down 6 pounds. PERFORMANCE STATUS  (ECOG): 1 HEENT: Allergies.  No visual changes, sore throat, mouth sores or tenderness. Declines dental evaluation. Lungs:  Left sided chest discomfort (stable).  Chronic shortness of breath.  No pleuritic chest pain.  No hemoptysis. Cardiac: No chest pain, palpitations, orthopnea, or PND. GI: Poor oral intake. Reflux. Nausea and vomiting. Reglan helping, somewhat.  Constipation.  Abdominal discomfort.  No melena or hematochezia. GU: No urgency, frequency, dysuria, or hematuria. Musculoskeletal: Left sided musculoskeletal pain.  Lumbar back pain, improved with Tramadol (does not take at work), requests Flexeril. No muscle tenderness. Extremities: Chronic mild left lower extremity edema. Wears Ted hose.  No pain. Skin: No rashes or skin changes.   Neuro: Short term memory issues.  No headache, numbness or weakness, balance or coordination issues. Endocrine: No diabetes, thyroid issues, hot flashes or night sweats. Psych: No mood changes, depression or anxiety. Pain: Left lower rib rage discomfort.  Abdominal discomfort.  Hip pain which at times shoots down right leg. Review of systems: All other systems reviewed and found to be negative  Physical Exam: Blood pressure 111/75, pulse (!) 123, resp. rate 18, weight 94 lb 9.2 oz (42.9 kg), last menstrual period 04/14/2004. GENERAL: Cachectic woman sitting comfortably in the exam room in no acute distress.  MENTAL STATUS: Alert and oriented to person, place and time. HEAD: Short gray hair.  Normocephalic, atraumatic, face symmetric, no Cushingoid features. EYES: Blue eyes. Pupils equal round and reactive to light and accomodation. No conjunctivitis or scleral icterus. ENT: Oropharynx clear without lesion. Tongue normal. Mucous membranes moist.  RESPIRATORY: Clear to auscultation without rales, wheezes or rhonchi. CARDIOVASCULAR: Regular rate and rhythm without murmur, rub or gallop. ABDOMEN: Soft, non-tender, with active  bowel sounds, and no hepatosplenomegaly. No guarding or rebound tenderness.  No masses. SKIN: No rashes, ulcers or lesions. EXTREMITIES:  No edema, skin discoloration or tenderness. No palpable cords. LYMPH NODES: No palpable cervical, supraclavicular, axillary or inguinal adenopathy  NEUROLOGICAL: Appropriate. PSYCH:  Appropriate.   Appointment on 09/13/2016  Component Date Value Ref Range Status  . WBC 09/13/2016 24.7* 3.6 - 11.0 K/uL Final  . RBC 09/13/2016 2.77* 3.80 - 5.20 MIL/uL Final  . Hemoglobin 09/13/2016 8.2* 12.0 - 16.0 g/dL Final  . HCT 09/13/2016 25.1* 35.0 - 47.0 % Final  . MCV 09/13/2016 90.5  80.0 - 100.0 fL Final  . MCH 09/13/2016 29.7  26.0 - 34.0 pg Final  . MCHC 09/13/2016 32.8  32.0 - 36.0 g/dL Final  . RDW 09/13/2016 19.7* 11.5 - 14.5 % Final  . Platelets 09/13/2016 61* 150 - 440 K/uL Final    Assessment:  Christina Mckee is a 55 y.o. female with metastatic squamous cell lung cancer. Molecular studies were negative studies for EGFR, ALK, and ROS1. PD-L1 was 1%.  Chest CT angiogram on 03/05/2016 revealed 8.7 x 5.7 x 3.3 cm mass in the medial aspect of the superior segment of the left lower lobe extending into the medial aspect of the left upper lobe and into the left hilum. There was vascular and endobronchial encasement and narrowing. There were multiple small left lung nodules. Abdomen and pelvic CT scan revealed diffuse hepatic steatosis and no evidence of metastatic disease in the abdomen or pelvis.  CT guided left lower lobe biopsy on 03/29/2016 revealed moderately differentiated squamous cell carcinoma. CEA and LDH were normal on 04/05/2016.  PET scan on 04/14/2016 revealed hypermetabolic left lower lobe mass with hypermetabolic left hilar adenopathy and left hepatic lobe lesion. Findings were consistent with stage IV primary bronchogenic carcinoma. There were mildly hypermetabolic subcarinal lymph node versus mild esophageal uptake. There was hepatic  steatosis. Head CT on 04/15/2016 was normal.  CEA (2.6) and LDH (128) were normal on 04/05/2016.  She has a history of an extensive left lower extremity thromboses in 12/2012. Because of the extensive clot burden, she underwent TPA with angioplasty on 12/28/2012. An IVC filter was placed on 12/29/2012 and still remains. Hypercoagulable workup was negative for Factor V Leiden, prothrombin gene mutation, lupus anticoagulant, and antithrombin III. She was on Xarelto for 6 months.  She declined low dose Eliquis secondary to cost.  She received 3 cycles of carboplatin and Abraxane (04/30/2016 - 06/17/2016).  Chest, abdomen, and pelvic CT scan on 07/05/2016 revealed a mixed response.  There was response to therapy of dominant left lower lobe lung mass (5.7 x 3.3 cm to 5.5 x 2.3 cm).  Liver metastasis was likely progressive (3.4 x 2 cm).  There was development of multiple bilateral renal lesions.  There was new anterior L3 lucent and sclerotic lesion.  Head MRI on 07/24/2016 revealed no evidence of metastatic disease.  Lumbar spine MRI on 07/31/2016 revealed multiple vertebral body bone lesions most consistent with metastatic disease.  There was no evidence of epidural extension.  There was a small left paracentral disc protrusion at T12-L1. There was no evidence of nerve root impingement.  She began monthly Xgeva on 07/15/2016.  She has received 2 cycles of nivolumab + ipilumumab on SWOG S1400 (08/02/2016 - 08/16/2016).  She completed palliative radiation (began 08/11/2016).  Chest CT on 09/06/2016 revealed recurrence / progression of lung cancer with increased nodular and masslike consolidation in the LEFT upper lobe and LEFT lower lobe.  There were several new small nodules in the RIGHT middle lobe.  There was progression of hepatic metastasis.  Renal metastatic lesions were not well demonstrated.  Symptomatically, she is continues to struggle with nausea and poor oral intake.  She is losing weight.  She  has progressive anemia and thrombocytopenia.  She remains concerned about missing work.  Plan: 1.  Labs today:  CBC with diff, BMP, ESR. 2.  Discuss nausea and poor oral intake.  Reglan helping, somewhat.  Concern for taking any medication which could affect mentation and limit patient's ability to work (Ativan).  Other anti-emetics tried, not helpful.  Discussed scopolamine patch, but declined eye evaluation to r/o glaucoma.  Discuss adding Decadron.  Side effects reviewed.  Patient agreeable. 3.  Discuss counts.  Discuss chemotherapy.  Patient weak and debilitated.  She is not a good candidate for chemotherapy. 4.  Discuss Hospice.  Patient not interested.  Discussed fluids.  Patient not interested in taking time off of work for IVF support. 5.  Rx:  Flexeril 5 mg po TID prn muscle spasms. 6.  Rx:  Decadron 2 mg po q day. 7.  RTC 1 week for MD assess and labs (CBC, BMP).   Lequita Asal, MD  09/13/2016, 4:20 PM

## 2016-09-13 ENCOUNTER — Inpatient Hospital Stay (HOSPITAL_BASED_OUTPATIENT_CLINIC_OR_DEPARTMENT_OTHER): Payer: BLUE CROSS/BLUE SHIELD | Admitting: Hematology and Oncology

## 2016-09-13 ENCOUNTER — Inpatient Hospital Stay: Payer: BLUE CROSS/BLUE SHIELD

## 2016-09-13 VITALS — BP 111/75 | HR 123 | Resp 18 | Wt 94.6 lb

## 2016-09-13 DIAGNOSIS — C787 Secondary malignant neoplasm of liver and intrahepatic bile duct: Secondary | ICD-10-CM

## 2016-09-13 DIAGNOSIS — C7951 Secondary malignant neoplasm of bone: Secondary | ICD-10-CM | POA: Diagnosis not present

## 2016-09-13 DIAGNOSIS — C3432 Malignant neoplasm of lower lobe, left bronchus or lung: Secondary | ICD-10-CM | POA: Diagnosis not present

## 2016-09-13 DIAGNOSIS — C3492 Malignant neoplasm of unspecified part of left bronchus or lung: Secondary | ICD-10-CM

## 2016-09-13 DIAGNOSIS — R112 Nausea with vomiting, unspecified: Secondary | ICD-10-CM

## 2016-09-13 DIAGNOSIS — D696 Thrombocytopenia, unspecified: Secondary | ICD-10-CM

## 2016-09-13 DIAGNOSIS — E43 Unspecified severe protein-calorie malnutrition: Secondary | ICD-10-CM

## 2016-09-13 DIAGNOSIS — Z79899 Other long term (current) drug therapy: Secondary | ICD-10-CM

## 2016-09-13 DIAGNOSIS — F17210 Nicotine dependence, cigarettes, uncomplicated: Secondary | ICD-10-CM

## 2016-09-13 DIAGNOSIS — D72829 Elevated white blood cell count, unspecified: Secondary | ICD-10-CM

## 2016-09-13 DIAGNOSIS — Z5111 Encounter for antineoplastic chemotherapy: Secondary | ICD-10-CM | POA: Diagnosis not present

## 2016-09-13 DIAGNOSIS — D649 Anemia, unspecified: Secondary | ICD-10-CM

## 2016-09-13 LAB — CBC
HEMATOCRIT: 25.1 % — AB (ref 35.0–47.0)
Hemoglobin: 8.2 g/dL — ABNORMAL LOW (ref 12.0–16.0)
MCH: 29.7 pg (ref 26.0–34.0)
MCHC: 32.8 g/dL (ref 32.0–36.0)
MCV: 90.5 fL (ref 80.0–100.0)
Platelets: 61 10*3/uL — ABNORMAL LOW (ref 150–440)
RBC: 2.77 MIL/uL — AB (ref 3.80–5.20)
RDW: 19.7 % — ABNORMAL HIGH (ref 11.5–14.5)
WBC: 24.7 10*3/uL — AB (ref 3.6–11.0)

## 2016-09-13 LAB — BASIC METABOLIC PANEL
Anion gap: 12 (ref 5–15)
BUN: 15 mg/dL (ref 6–20)
CO2: 21 mmol/L — ABNORMAL LOW (ref 22–32)
Calcium: 7.9 mg/dL — ABNORMAL LOW (ref 8.9–10.3)
Chloride: 100 mmol/L — ABNORMAL LOW (ref 101–111)
Creatinine, Ser: 1.14 mg/dL — ABNORMAL HIGH (ref 0.44–1.00)
GFR calc Af Amer: >60 mL/min (ref >60)
GFR calc non Af Amer: 52 mL/min — ABNORMAL LOW (ref >60)
Glucose, Bld: 142 mg/dL — ABNORMAL HIGH (ref 65–99)
Potassium: 5.4 mmol/L — ABNORMAL HIGH (ref 3.5–5.1)
Sodium: 133 mmol/L — ABNORMAL LOW (ref 135–145)

## 2016-09-13 LAB — SEDIMENTATION RATE: Sed Rate: 70 mm/hr — ABNORMAL HIGH (ref 0–30)

## 2016-09-13 MED ORDER — CYCLOBENZAPRINE HCL 5 MG PO TABS: 5.0000 mg | ORAL_TABLET | Freq: Three times a day (TID) | ORAL | 0 refills | Status: AC | PRN

## 2016-09-13 MED ORDER — DEXAMETHASONE 2 MG PO TABS
2.0000 mg | ORAL_TABLET | Freq: Every day | ORAL | 0 refills | Status: AC
Start: 2016-09-13 — End: ?

## 2016-09-13 NOTE — Progress Notes (Signed)
Patient requesting refill for Flexeril.  Wants hard copy of prescription.  Continues to have pain in abdomen that radiates into pelvic area and down legs.

## 2016-09-14 ENCOUNTER — Other Ambulatory Visit: Payer: Self-pay | Admitting: *Deleted

## 2016-09-14 ENCOUNTER — Telehealth: Payer: Self-pay | Admitting: *Deleted

## 2016-09-14 DIAGNOSIS — C3492 Malignant neoplasm of unspecified part of left bronchus or lung: Secondary | ICD-10-CM

## 2016-09-14 DIAGNOSIS — E875 Hyperkalemia: Secondary | ICD-10-CM

## 2016-09-14 NOTE — Telephone Encounter (Signed)
Pt is rtning dr Mike Gip call from this am.  I explained that her potassium is high and it can cause cardiac issues.  Dr. Mike Gip wanted her to come and have lab checked today and that she needs to hold the potassium today until we can can get it recheked.  Fraser Din was calling at 4:30 this afternoon and was not going to come back over here today.   I checked with corcoran and she wanted me to document what pt will do but she wants her to stay off potassium until it is rechecked.  I asked pt to come tom am before work and she has to be at work at 8 am and that is when lab opens here.  I checked with outpt labs in hosp. They are open 24 hours a day.  The admitting registration is open to register from 6:30 am to 7:30 pm and if before or after these hours she can come to ER to register and the staff can still draw the lab.  I checked to see what I need to order and staff tells me just enter the lab under the md ordering in epic and then release the order and the lab staff can see the lab that needs to be done and the date to do it.  I called back to pt and gave her these options as alternative because otherwise she is going to work and is not coming over to the cancer center to get labs any this week.  She is agreeable to coming to outpt tom. After work to get potassium checked.  She also wants to me cancel the appt for endocrinology also.  She states that eventhough dr Mike Gip wants her to go she can't afford to go and to get off work to go and does not feel it is warranted.  I told her that this is our rec: and she can tell me what she is going to do so she wants it cancelled and I called the office and cancelled as soon as I got off the phone with pt and she was told I would call and cancel it for her per her request.

## 2016-09-14 NOTE — Progress Notes (Signed)
Patient returned to clinic today for follow up after hospitalization on 09/07/2016. The patient was accompanied by her sister, Christina Mckee, who lives in [place]. The patient completed the S1400I questionnaires including the PRO Outcomes and EQ-5D. The patient stated she was agreeable and completed the questionnaires in the clinic without difficulty. The patient verbilized understanding that she is no longer on active study but is willing to comply with follow up requirements in order to help someone else. Dr. Mike Gip discussed the plan of care with the patient and her sister. Her sister offered continuing support for all of her needs at this time but patient refused. The patient also states that she will continue to work until September 22, 2016 at which time her disability will go into effect.   Christina Mo, RN, BSN 09/14/2016 10:18 AM

## 2016-09-15 ENCOUNTER — Other Ambulatory Visit: Payer: Self-pay | Admitting: *Deleted

## 2016-09-15 ENCOUNTER — Inpatient Hospital Stay: Payer: BLUE CROSS/BLUE SHIELD

## 2016-09-15 ENCOUNTER — Other Ambulatory Visit
Admission: RE | Admit: 2016-09-15 | Discharge: 2016-09-15 | Disposition: A | Discharge status: Home / Self Care | Payer: BLUE CROSS/BLUE SHIELD | Source: Ambulatory Visit | Attending: Hematology and Oncology | Admitting: Hematology and Oncology

## 2016-09-15 DIAGNOSIS — Z5111 Encounter for antineoplastic chemotherapy: Secondary | ICD-10-CM | POA: Diagnosis not present

## 2016-09-15 DIAGNOSIS — E875 Hyperkalemia: Secondary | ICD-10-CM

## 2016-09-15 LAB — POTASSIUM: Potassium: 3.3 mmol/L — ABNORMAL LOW (ref 3.5–5.1)

## 2016-09-16 ENCOUNTER — Telehealth: Payer: Self-pay | Admitting: *Deleted

## 2016-09-16 ENCOUNTER — Other Ambulatory Visit: Payer: Self-pay | Admitting: *Deleted

## 2016-09-16 DIAGNOSIS — C3492 Malignant neoplasm of unspecified part of left bronchus or lung: Secondary | ICD-10-CM

## 2016-09-16 DIAGNOSIS — C7951 Secondary malignant neoplasm of bone: Secondary | ICD-10-CM

## 2016-09-16 NOTE — Telephone Encounter (Signed)
Called pt to ask her what amount potassium she was on and she is taking 2 in am, and 1 lunch, 2 in pm and it is 10 meq.  She was told that K-3.3 and she should now take potassium 2 a day and we will recheck her labs on Monday. She is agreeable to this.  She states that she has been constipated since she left the hosp. And she has been taking colace every day since she left and it has not helped.  She took dose dulcolax and it worked some. She will feel better when she gets cleared out.  She is still weak in the legs.  She is taking 1000 calories a day a nd keeping it down. She will see [place] on monday

## 2016-09-19 ENCOUNTER — Other Ambulatory Visit: Payer: Self-pay | Admitting: Hematology and Oncology

## 2016-09-19 DIAGNOSIS — C3492 Malignant neoplasm of unspecified part of left bronchus or lung: Secondary | ICD-10-CM

## 2016-09-20 ENCOUNTER — Encounter: Payer: Self-pay | Admitting: Hematology and Oncology

## 2016-09-20 ENCOUNTER — Inpatient Hospital Stay (HOSPITAL_BASED_OUTPATIENT_CLINIC_OR_DEPARTMENT_OTHER): Payer: BLUE CROSS/BLUE SHIELD | Admitting: Hematology and Oncology

## 2016-09-20 ENCOUNTER — Other Ambulatory Visit: Payer: Self-pay | Admitting: *Deleted

## 2016-09-20 ENCOUNTER — Encounter: Payer: Self-pay | Admitting: *Deleted

## 2016-09-20 ENCOUNTER — Inpatient Hospital Stay: Payer: BLUE CROSS/BLUE SHIELD

## 2016-09-20 VITALS — BP 124/75 | HR 128 | Temp 97.2°F

## 2016-09-20 DIAGNOSIS — C3492 Malignant neoplasm of unspecified part of left bronchus or lung: Secondary | ICD-10-CM

## 2016-09-20 DIAGNOSIS — C787 Secondary malignant neoplasm of liver and intrahepatic bile duct: Secondary | ICD-10-CM

## 2016-09-20 DIAGNOSIS — R531 Weakness: Secondary | ICD-10-CM

## 2016-09-20 DIAGNOSIS — E876 Hypokalemia: Secondary | ICD-10-CM

## 2016-09-20 DIAGNOSIS — E860 Dehydration: Secondary | ICD-10-CM

## 2016-09-20 DIAGNOSIS — R05 Cough: Secondary | ICD-10-CM

## 2016-09-20 DIAGNOSIS — C7951 Secondary malignant neoplasm of bone: Secondary | ICD-10-CM

## 2016-09-20 DIAGNOSIS — C3432 Malignant neoplasm of lower lobe, left bronchus or lung: Secondary | ICD-10-CM | POA: Diagnosis not present

## 2016-09-20 DIAGNOSIS — R112 Nausea with vomiting, unspecified: Secondary | ICD-10-CM

## 2016-09-20 DIAGNOSIS — E871 Hypo-osmolality and hyponatremia: Secondary | ICD-10-CM

## 2016-09-20 DIAGNOSIS — R0602 Shortness of breath: Secondary | ICD-10-CM

## 2016-09-20 DIAGNOSIS — Z5111 Encounter for antineoplastic chemotherapy: Secondary | ICD-10-CM | POA: Diagnosis not present

## 2016-09-20 DIAGNOSIS — D72829 Elevated white blood cell count, unspecified: Secondary | ICD-10-CM

## 2016-09-20 DIAGNOSIS — Z79899 Other long term (current) drug therapy: Secondary | ICD-10-CM

## 2016-09-20 DIAGNOSIS — R5381 Other malaise: Secondary | ICD-10-CM

## 2016-09-20 DIAGNOSIS — E43 Unspecified severe protein-calorie malnutrition: Secondary | ICD-10-CM

## 2016-09-20 DIAGNOSIS — F17210 Nicotine dependence, cigarettes, uncomplicated: Secondary | ICD-10-CM

## 2016-09-20 LAB — BASIC METABOLIC PANEL
Anion gap: 17 — ABNORMAL HIGH (ref 5–15)
BUN: 19 mg/dL (ref 6–20)
CO2: 12 mmol/L — ABNORMAL LOW (ref 22–32)
Calcium: 8.3 mg/dL — ABNORMAL LOW (ref 8.9–10.3)
Chloride: 100 mmol/L — ABNORMAL LOW (ref 101–111)
Creatinine, Ser: 1.35 mg/dL — ABNORMAL HIGH (ref 0.44–1.00)
GFR calc Af Amer: 49 mL/min — ABNORMAL LOW (ref >60)
GFR calc non Af Amer: 43 mL/min — ABNORMAL LOW (ref >60)
Glucose, Bld: 240 mg/dL — ABNORMAL HIGH (ref 65–99)
Potassium: 3.3 mmol/L — ABNORMAL LOW (ref 3.5–5.1)
Sodium: 129 mmol/L — ABNORMAL LOW (ref 135–145)

## 2016-09-20 LAB — CBC WITH DIFFERENTIAL/PLATELET
Basophils Absolute: 0.1 10*3/uL (ref 0–0.1)
Basophils Relative: 0 %
Eosinophils Absolute: 0 10*3/uL (ref 0–0.7)
Eosinophils Relative: 0 %
HCT: 26.7 % — ABNORMAL LOW (ref 35.0–47.0)
Hemoglobin: 8.4 g/dL — ABNORMAL LOW (ref 12.0–16.0)
Lymphocytes Relative: 1 %
Lymphs Abs: 0.3 10*3/uL — ABNORMAL LOW (ref 1.0–3.6)
MCH: 29.2 pg (ref 26.0–34.0)
MCHC: 31.5 g/dL — ABNORMAL LOW (ref 32.0–36.0)
MCV: 92.6 fL (ref 80.0–100.0)
Monocytes Absolute: 1.7 10*3/uL — ABNORMAL HIGH (ref 0.2–0.9)
Monocytes Relative: 4 %
Neutro Abs: 36.2 10*3/uL — ABNORMAL HIGH (ref 1.4–6.5)
Neutrophils Relative %: 95 %
Platelets: 54 10*3/uL — ABNORMAL LOW (ref 150–440)
RBC: 2.88 MIL/uL — ABNORMAL LOW (ref 3.80–5.20)
RDW: 19.7 % — ABNORMAL HIGH (ref 11.5–14.5)
WBC: 38.3 10*3/uL — ABNORMAL HIGH (ref 3.6–11.0)

## 2016-09-20 LAB — SAMPLE TO BLOOD BANK

## 2016-09-20 MED ORDER — METOCLOPRAMIDE HCL 5 MG PO TABS: 5.0000 mg | ORAL_TABLET | Freq: Three times a day (TID) | ORAL | 1 refills | Status: AC

## 2016-09-20 NOTE — Progress Notes (Signed)
Patient here today for scheduled appointment for lab/MD.  Unable to keep anything down.  Has kept one meal down in 3 weeks.  States her legs are weak.  [place] [address] after her MD visit last week.  States she fell up her front steps hitting her hands and knees. Asking for refill for Reglan.  States she feels full of gas. Patient had a cup of water when she got to clinic.  Threw it up in the exam room.

## 2016-09-20 NOTE — Progress Notes (Signed)
Pt requests hospice but at hospice home.  Did call and speak to [facility] and they would have to come in home once and eval pt. And if she qualifies she can go to hospice home.  I told Langley Gauss per corcoran she only has few weeks..  Will fax over the ref. And included the addres of her Dad's house in which there  Is where she wants to be evaluated.  Address : [address], [place] and phone number for  Contacts are sister-Jane [phone].  Daughter sec. Contact ashley at [phone]. Form faxed to hospice

## 2016-09-20 NOTE — Progress Notes (Signed)
[facility] day:  09/20/2016   Chief Complaint: Christina Mckee is a 55 y.o. female with metastatic squamous cell lung cancer who is seen for 1 week assessment.  HPI: The patient was last seen in the medical oncology clinic on 09/13/2016.  At that time, she was having trouble keeping food down.  She was on Protonix twice a day.  She was having some improvement with Reglan.  We discussed adding Decadron 2 mg a day to help with nausea and increase appetite.  Dose could be increased to twice a day.  We discussed issues with her blood counts and general debilitation.  She was felt not to be a candidate for chemotherapy at that time.  CBC revealed a hematocrit of 25.1, hemoglobin 8.2, platelets 61,000, and WBC 24,700.  Potassium later returned 5.4.  She was contacted and her potassium held.  We discussed Hospice at last visit.  She declined.  She has struggled to continue to work to meet her disability initiation date.  She declined returning to clinic for IV fluids or other support.  Potasium was rechecked on 10/25/201 and was 3.3.  Her potassium supplementation was restarted at 50% dosing.  She worked most days last week.  She has continued to struggle to keep anything down (food, liquid, pills).  She stayed in bed all weekend.  She took her pain medications at home (does not take at work because of decreased ability to function).  She is extremely weak today.  She almost did not come to clinic.  She has declined admission on multiple occasions.  She has had issues with her bowels not moving since she ran out of Reglan.  She notes gas pain.  She is taking Tramadol and Flexeril.  She has not taken her Decadron.   Past Medical History:  Diagnosis Date  . DVT (deep venous thrombosis) ([place])    in 2015  . Menopause    prior to 2014  . Squamous cell lung cancer [facility])     Past Surgical History:  Procedure Laterality Date  . BREAST CYST EXCISION     left  breast removal  . CESAREAN SECTION    . IVC FILTER PLACEMENT ([facility] HX)    . PERIPHERAL VASCULAR CATHETERIZATION N/A 04/28/2016   Procedure: Glori Luis Cath Insertion;  Surgeon: Algernon Huxley, MD;  Location: [facility];  Service: Cardiovascular;  Laterality: N/A;    Family History  Problem Relation Age of Onset  . Breast cancer Mother   . Breast cancer Maternal Aunt   . Stomach cancer Paternal Uncle   . Leukemia Paternal Uncle     Social History:  reports that she has been smoking Cigarettes.  She has been smoking about 0.25 packs per day. She has never used smokeless tobacco. She reports that she drinks alcohol. She reports that she does not use drugs.  She has smoked 1/2-1 pack a day since age 55. She has a 30-40 pack year smoking history.  She drinks 4 beers a day. She lives in [place] alone with her cats and dogs. She has a dog named Production assistant, radio (lab and shepherd mix).  She previously worked at [employer], but notes all positions were recently eliminated. She continues to work.  Her daughter, Caryl Pina,  got married on 08/01/2016.  She has to work until 09/22/2016.  She is accompanied by her daughter, Caryl Pina, and her sister, Opal Sidles.  Allergies:  Allergies  Allergen Reactions  . Penicillins Hives  and Other (See Comments)    Has patient had a PCN reaction causing immediate rash, facial/tongue/throat swelling, SOB or lightheadedness with hypotension: No Has patient had a PCN reaction causing severe rash involving mucus membranes or skin necrosis: No Has patient had a PCN reaction that required hospitalization No Has patient had a PCN reaction occurring within the last 10 years: No If all of the above answers are "NO", then may proceed with Cephalosporin use.    Current Medications: Current Outpatient Prescriptions  Medication Sig Dispense Refill  . cyclobenzaprine (FLEXERIL) 5 MG tablet Take 1 tablet (5 mg total) by mouth 3 (three) times daily as needed for muscle spasms. 20 tablet 0  .  dexamethasone (DECADRON) 2 MG tablet Take 1 tablet (2 mg total) by mouth daily. 30 tablet 0  . guaifenesin (ROBITUSSIN) 100 MG/5ML syrup Take 200 mg by mouth 3 (three) times daily as needed for cough or congestion.    Marland Kitchen guaiFENesin-codeine (CHERATUSSIN AC) 100-10 MG/5ML syrup Take 5 mLs by mouth 3 (three) times daily as needed for cough. 120 mL 0  . ibuprofen (ADVIL,MOTRIN) 200 MG tablet Take by mouth every 6 (six) hours as needed.     Marland Kitchen KLOR-CON 10 10 MEQ tablet TAKE 3 TABS BY MOUTH IN THE MORNING AND 2 TABS BY MOUTH IN THE EVENING 150 tablet 1  . KLOR-CON M10 10 MEQ tablet     . lidocaine-prilocaine (EMLA) cream Apply 1 application topically as needed. 30 g 6  . megestrol (MEGACE) 400 MG/10ML suspension Take 5 mLs (200 mg total) by mouth daily. 150 mL 1  . metoCLOPramide (REGLAN) 5 MG tablet Take 1 tablet (5 mg total) by mouth 3 (three) times daily before meals. 60 tablet 1  . Multiple Vitamin (MULTIVITAMIN) tablet Take 1 tablet by mouth daily.    . nicotine (NICODERM CQ - DOSED IN MG/24 HOURS) 21 mg/24hr patch Place 1 patch (21 mg total) onto the skin daily. 28 patch 0  . ondansetron (ZOFRAN) 8 MG tablet Take 1 tablet (8 mg total) by mouth every 8 (eight) hours as needed for nausea or vomiting. 60 tablet 1  . pantoprazole (PROTONIX) 40 MG tablet Take 1 tablet (40 mg total) by mouth 2 (two) times daily. 60 tablet 0  . prochlorperazine (COMPAZINE) 10 MG tablet Take 1 tablet (10 mg total) by mouth every 6 (six) hours as needed for nausea or vomiting. 30 tablet 6  . traMADol (ULTRAM) 50 MG tablet Take 1 tablet (50 mg total) by mouth every 6 (six) hours as needed. 60 tablet 0   No current facility-administered medications for this visit.    Facility-Administered Medications Ordered in Other Visits  Medication Dose Route Frequency Provider Last Rate Last Dose  . alteplase (CATHFLO ACTIVASE) injection 2 mg  2 mg Intracatheter Once Lequita Asal, MD      . sodium chloride flush (NS) 0.9 %  injection 10 mL  10 mL Intravenous PRN Lequita Asal, MD   10 mL at 04/29/16 0932    Review of Systems:  GENERAL: Feels lousy. No fevers or sweats. Feels "hot and cold".  Weight loss of 26 pounds in 6 months.  Unable to weigh today. PERFORMANCE STATUS (ECOG): 2-3 HEENT: No visual changes, sore throat, mouth sores or tenderness. Lungs:  Left sided chest discomfort.  Chronic shortness of breath.  No pleuritic chest pain.  No hemoptysis. Cardiac: No chest pain, palpitations, orthopnea, or PND. GI: Poor oral intake.  Unable to keep much of anything  down.  Nausea.  Some vomiting.  Ran out of Reglan.  No melena or hematochezia. GU: No urgency, frequency, dysuria, or hematuria. Musculoskeletal: Left sided musculoskeletal pain.  Lumbar back pain, improved with Tramadol. No muscle tenderness. Extremities: Chronic mild left lower extremity edema. Wears Ted hose.  No pain. Skin: No rashes or skin changes. Neuro: Short term memory issues.  General weakness.  Legs weak.  No headache, numbness or focal weakness.  Fell up steps last week. Endocrine: No diabetes, thyroid issues, hot flashes or night sweats. Psych: No mood changes. Pain: Can't get comfortable. Review of systems: All other systems reviewed and found to be negative  Physical Exam: 94 Blood pressure 124/75, pulse (!) 128, temperature 97.2 F (36.2 C), temperature source Tympanic, last menstrual period 04/14/2004. GENERAL: Cachectic woman lying in bed in the exam room in no acute distress.  MENTAL STATUS: Alert and oriented to person, place and time. HEAD: Short gray hair.  Temporal wasting.  Normocephalic, atraumatic, face symmetric, no Cushingoid features. EYES: Blue eyes. Pupils equal round and reactive to light and accomodation. No conjunctivitis or scleral icterus. ENT: Oropharynx clear without lesion. Tongue normal. Mucous membranes moist.  RESPIRATORY: Clear to auscultation without rales, wheezes or  rhonchi. CARDIOVASCULAR: Tachycardic.  Regular rate and rhythm without murmur, rub or gallop. ABDOMEN: Soft, tender in the right upper quadrant without guarding or rebound tenderness.  Active bowel sounds and no hepatosplenomegaly. No masses. SKIN: No rashes, ulcers or lesions. EXTREMITIES:  Slight left lower extremity edema.  No skin discoloration or tenderness. No palpable cords. LYMPH NODES: No palpable cervical, supraclavicular, axillary or inguinal adenopathy  NEUROLOGICAL: Appropriate. PSYCH:  Appropriate.   Appointment on 09/20/2016  Component Date Value Ref Range Status  . WBC 09/20/2016 38.3* 3.6 - 11.0 K/uL Final  . RBC 09/20/2016 2.88* 3.80 - 5.20 MIL/uL Final  . Hemoglobin 09/20/2016 8.4* 12.0 - 16.0 g/dL Final  . HCT 09/20/2016 26.7* 35.0 - 47.0 % Final  . MCV 09/20/2016 92.6  80.0 - 100.0 fL Final  . MCH 09/20/2016 29.2  26.0 - 34.0 pg Final  . MCHC 09/20/2016 31.5* 32.0 - 36.0 g/dL Final  . RDW 09/20/2016 19.7* 11.5 - 14.5 % Final  . Platelets 09/20/2016 54* 150 - 440 K/uL Final  . Neutrophils Relative % 09/20/2016 95  % Final  . Neutro Abs 09/20/2016 36.2* 1.4 - 6.5 K/uL Final  . Lymphocytes Relative 09/20/2016 1  % Final  . Lymphs Abs 09/20/2016 0.3* 1.0 - 3.6 K/uL Final  . Monocytes Relative 09/20/2016 4  % Final  . Monocytes Absolute 09/20/2016 1.7* 0.2 - 0.9 K/uL Final  . Eosinophils Relative 09/20/2016 0  % Final  . Eosinophils Absolute 09/20/2016 0.0  0 - 0.7 K/uL Final  . Basophils Relative 09/20/2016 0  % Final  . Basophils Absolute 09/20/2016 0.1  0 - 0.1 K/uL Final  . Sodium 09/20/2016 129* 135 - 145 mmol/L Final  . Potassium 09/20/2016 3.3* 3.5 - 5.1 mmol/L Final  . Chloride 09/20/2016 100* 101 - 111 mmol/L Final  . CO2 09/20/2016 12* 22 - 32 mmol/L Final  . Glucose, Bld 09/20/2016 240* 65 - 99 mg/dL Final  . BUN 09/20/2016 19  6 - 20 mg/dL Final  . Creatinine, Ser 09/20/2016 1.35* 0.44 - 1.00 mg/dL Final  . Calcium 09/20/2016 8.3* 8.9 - 10.3  mg/dL Final  . GFR calc non Af Amer 09/20/2016 43* >60 mL/min Final  . GFR calc Af Amer 09/20/2016 49* >60 mL/min Final   Comment: (  NOTE) The eGFR has been calculated using the CKD EPI equation. This calculation has not been validated in all clinical situations. eGFR's persistently <60 mL/min signify possible Chronic Kidney Disease.   . Anion gap 09/20/2016 17* 5 - 15 Final  . Blood Bank Specimen 09/20/2016 SAMPLE AVAILABLE FOR TESTING   Final  . Sample Expiration 09/20/2016 09/23/2016   Final    Assessment:  SAMMI STOLARZ is a 55 y.o. female with metastatic squamous cell lung cancer. Molecular studies were negative studies for EGFR, ALK, and ROS1. PD-L1 was 1%.  Chest CT angiogram on 03/05/2016 revealed 8.7 x 5.7 x 3.3 cm mass in the medial aspect of the superior segment of the left lower lobe extending into the medial aspect of the left upper lobe and into the left hilum. There was vascular and endobronchial encasement and narrowing. There were multiple small left lung nodules. Abdomen and pelvic CT scan revealed diffuse hepatic steatosis and no evidence of metastatic disease in the abdomen or pelvis.  CT guided left lower lobe biopsy on 03/29/2016 revealed moderately differentiated squamous cell carcinoma. CEA and LDH were normal on 04/05/2016.  PET scan on 04/14/2016 revealed hypermetabolic left lower lobe mass with hypermetabolic left hilar adenopathy and left hepatic lobe lesion. Findings were consistent with stage IV primary bronchogenic carcinoma. There were mildly hypermetabolic subcarinal lymph node versus mild esophageal uptake. There was hepatic steatosis. Head CT on 04/15/2016 was normal.  CEA (2.6) and LDH (128) were normal on 04/05/2016.  She has a history of an extensive left lower extremity thromboses in 12/2012. Because of the extensive clot burden, she underwent TPA with angioplasty on 12/28/2012. An IVC filter was placed on 12/29/2012 and still remains.  Hypercoagulable workup was negative for Factor V Leiden, prothrombin gene mutation, lupus anticoagulant, and antithrombin III. She was on Xarelto for 6 months.  She declined low dose Eliquis secondary to cost.  She received 3 cycles of carboplatin and Abraxane (04/30/2016 - 06/17/2016).  Chest, abdomen, and pelvic CT scan on 07/05/2016 revealed a mixed response.  There was response to therapy of dominant left lower lobe lung mass (5.7 x 3.3 cm to 5.5 x 2.3 cm).  Liver metastasis was likely progressive (3.4 x 2 cm).  There was development of multiple bilateral renal lesions.  There was new anterior L3 lucent and sclerotic lesion.  Head MRI on 07/24/2016 revealed no evidence of metastatic disease.  Lumbar spine MRI on 07/31/2016 revealed multiple vertebral body bone lesions most consistent with metastatic disease.  There was no evidence of epidural extension.  There was a small left paracentral disc protrusion at T12-L1. There was no evidence of nerve root impingement.  She began monthly Xgeva on 07/15/2016.  She has received 2 cycles of nivolumab + ipilumumab on SWOG S1400 (08/02/2016 - 08/16/2016).  She completed palliative radiation (began 08/11/2016).  Chest CT on 09/06/2016 revealed recurrence / progression of lung cancer with increased nodular and masslike consolidation in the left upper lobe and left lower lobe.  There were several new small nodules in the right middle lobe.  There was progression of hepatic metastasis.  Symptomatically, she has continued to decline in health.  She has lost a significant amount of weight.  She is extremely weak.  She has increased abdominal discomfort and decreased bowel movement after running out of her Reglan.  She has anemia and thrombocytopenia.  She has hypokalemia.  She remains concerned about missing work.  Plan: 1.  Labs today:  CBC with diff, BMP. 2.  Discuss overall decline.  Discuss patient is not a candidate for chemotherapy.  [facility].  After  a long discussion, patient was agreeable to Hospice.  She is concerned about Hospice coming into her home.  She may be staying at her father's house temporarily.  She is interested in the Hospice Home.  Her prognosis is extremely poor. 3.  Letter for work stating she is unable to return to work. 4.  Rx:  Reglan. 5.  Encourage patient to try Decadron previously prescribed. 6.  Patient not interested in admission to the hospital.   Lequita Asal, MD  09/20/2016, 5:17 PM

## 2016-09-21 ENCOUNTER — Ambulatory Visit: Payer: Self-pay | Admitting: Gastroenterology

## 2016-09-22 ENCOUNTER — Ambulatory Visit
Admission: RE | Admit: 2016-09-22 | Payer: BLUE CROSS/BLUE SHIELD | Source: Ambulatory Visit | Admitting: Radiation Oncology

## 2016-09-23 ENCOUNTER — Telehealth: Payer: Self-pay | Admitting: *Deleted

## 2016-09-23 NOTE — Telephone Encounter (Signed)
Patient was admitted to hospice services, but had refused initially to go to hospice home wanting to go home to be with her babies(dogs) She did go to the hospice home the next day, but stated she does not know if she will stay there wanting to be with her babies at home.

## 2016-09-27 NOTE — Telephone Encounter (Signed)
I called hospice home and the patient is still in hospice home as of 11/6

## 2016-10-06 ENCOUNTER — Other Ambulatory Visit: Payer: Self-pay | Admitting: *Deleted

## 2016-10-06 ENCOUNTER — Other Ambulatory Visit: Payer: Self-pay | Admitting: Hematology and Oncology

## 2016-10-06 DIAGNOSIS — K219 Gastro-esophageal reflux disease without esophagitis: Secondary | ICD-10-CM

## 2016-10-06 DIAGNOSIS — R112 Nausea with vomiting, unspecified: Secondary | ICD-10-CM

## 2016-10-13 NOTE — Progress Notes (Signed)
Christina Mckee arrived this morning for scheduled Cycle 1, Day 1 infusion of Nivolumab + Ipilimumab per the SWOG S1400I research protocol. She has had her labs collected and per Dr. Mike Gip, she is hyponatremic, hypokalemia and her white blood count is 29,000. Also of note, her blood glucose is '242mg'$ /dL. Clinically significant lab values: Potassium - 3.2(grade 1); Sodium - 127(grade 3); Calcium 7.8; corrected calcium 8.54 (grade 2) blood glucose - 242(grade 2); and WBC - 29,000 indicating possible infection. Patient denies any symptoms other than sharp pain in her lower back, which radiates around to her hips and occasionally shoots down her thighs. States she has not taken her prn pain medication and will never take it again due to the severe nausea it causes her. Pain grade is 7-8/10 and occasionally up to a 10. Dr. Mike Gip has examined the patient and informed her that she is concerned that she is dehydrated due to her very low sodium and potassium levels along with her 3 lb weight loss since last week. States she will give her some hydration fluids this morning along with an IV potassium infusion and then send her for a chest Xray to determine whether she may have developed pneumonia or a lung infection. Also informed [name] that she will not be receiving her Study drug infusion today since her condition is so poor. Patient insists that she is well enough to receive it, but explained that it will be better to wait until she is better hydrated and free of any potential infection. Dr. Mike Gip reviewed results of brain MRI, which was negative.Patient did receive 2 hours of hydration fluids, then went to have her chest xray. She also received Percocet for pain, which she reports helped the pain a little, but made her very drowsy. Dr. Mike Gip has given her a prescription for Tramadol to see if this helps her pain, as well as Megace to hopefully improve her appetite. She has also ordered an MRI of her  spine, which will be performed on Saturday - 07/31/16. [name] will return to clinic in the morning for labs and possible hydration fluids. Current plan is to begin C1D1 study drugs on Monday - 08/02/16. Christina Mckee [id number]   Adverse Event Log  Study/Protocol: SWOG S1400I Cycle: pre-tx  Event Grade Onset Date Resolved Date Drug Name Attribution Treatment Comments  Hyponatremia 3 07/29/16   unrelated  IV fluids  Hypokalemia 1 07/29/16   unrelated  IV potassium  Weight loss 2 03/29/16   unrelated  Megace Rx 20 lb weight loss in <5 mos  Elevated WBC No grade 07/29/16   unrelated  U/A/ C&S/ CXR  Back Pain 3 07/27/16   unrelated  MRI of spine  Hyperglycemia 2 07/29/16   unrelated  none  Hypocalcemia 2 07/29/16   unrelated  Corrected calcium [id number], BSN, [place], OCN 07/29/2016 12:40 PM

## 2016-10-18 NOTE — Progress Notes (Signed)
Please note hypocalcemia is only a grade 1 when factoring in the corrected calcium value. Yolande Jolly, BSN, MHA, OCN 10/18/2016 1:32 PM

## 2016-10-22 DEATH — deceased

## 2016-10-27 ENCOUNTER — Encounter: Payer: Self-pay | Admitting: Hematology and Oncology

## 2016-10-27 DIAGNOSIS — D696 Thrombocytopenia, unspecified: Secondary | ICD-10-CM | POA: Insufficient documentation

## 2016-10-30 ENCOUNTER — Other Ambulatory Visit: Payer: Self-pay | Admitting: Nurse Practitioner

## 2017-05-24 IMAGING — NM NM BONE WHOLE BODY
1 series · 2 of 2 positions shown · non-contrast
Comparison: PET-CT of 03/29/2016

CLINICAL DATA: History of metastatic squamous cell of the lung, now
with increasing alkaline phosphatase

EXAM:
NUCLEAR MEDICINE WHOLE BODY BONE SCAN
TECHNIQUE: Whole body anterior and posterior images were obtained approximately
3 hours after intravenous injection of radiopharmaceutical.
RADIOPHARMACEUTICALS:  22.35 mCi Hechnetium-UUm MDP IV

[Series 1000: 3 hr wholebody · 2.40mm/px · 2 of 2 frames shown]
[frame 1/2]
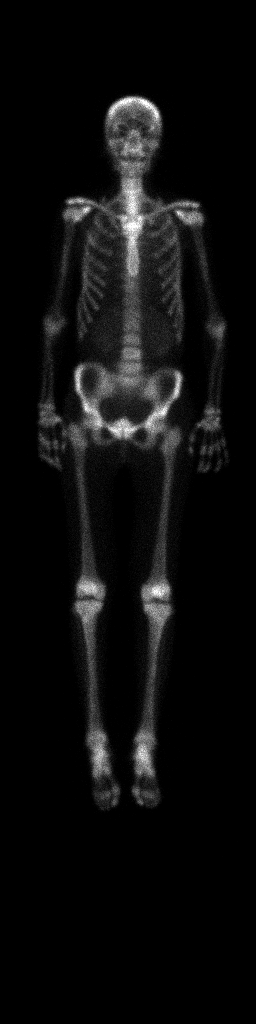
[frame 2/2]
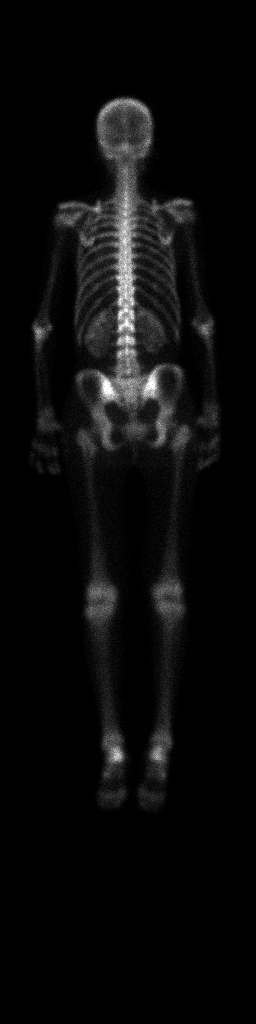

[2 of 2 positions shown; findings below may reference images not displayed]

FINDINGS: Activity throughout the skeleton is unremarkable. Mild activity in
mid hind foot is consistent with degenerative change. However there
is no evidence of bone metastasis.
IMPRESSION: No evidence of bone metastasis.

## 2017-09-26 IMAGING — CT CT CHEST W/O CM
2 of 4 series · 15 of 36 positions shown, 18 images · non-contrast
Comparison: CT 07/05/2016, PET-CT 04/14/2016

CLINICAL DATA: Squamous cell carcinoma LEFT lung.

EXAM:
CT CHEST WITHOUT CONTRAST
TECHNIQUE: Multidetector CT imaging of the chest was performed following the
standard protocol without IV contrast.

[Series 3: lungs · axial · 0.55mm/px · z∈[-582,-262]mm · 12 of 180 slices shown, 15 images]
[im 10/180  mediastinal]
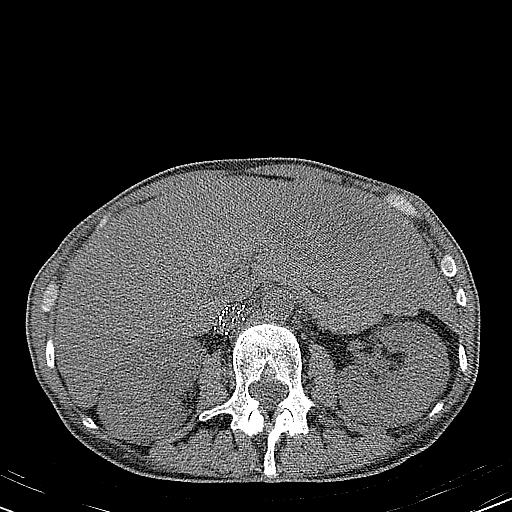
[im 10/180  lung]
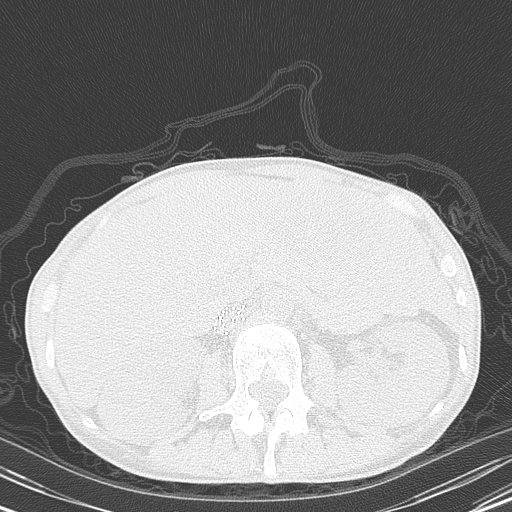
[im 29/180  lung]
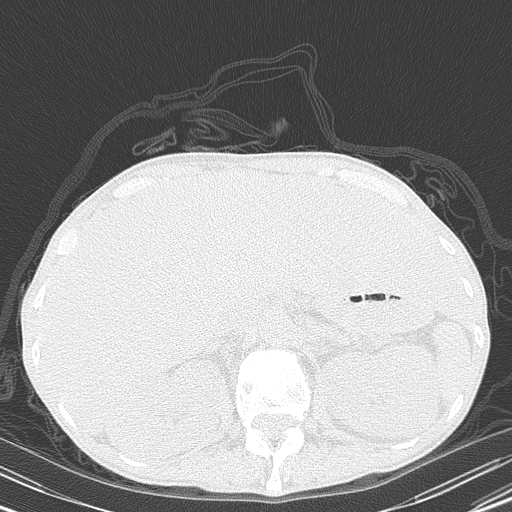
[im 38/180  lung]
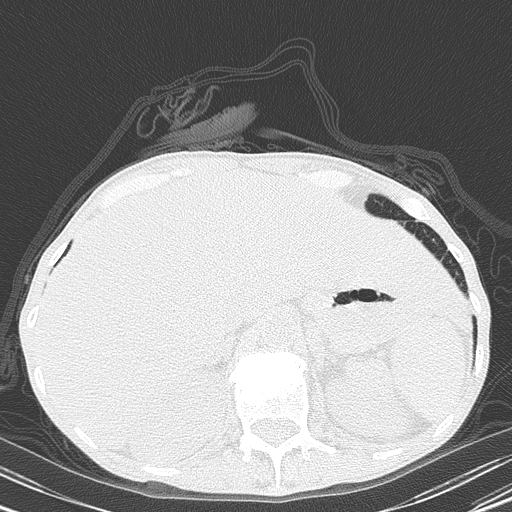
[im 57/180  lung]
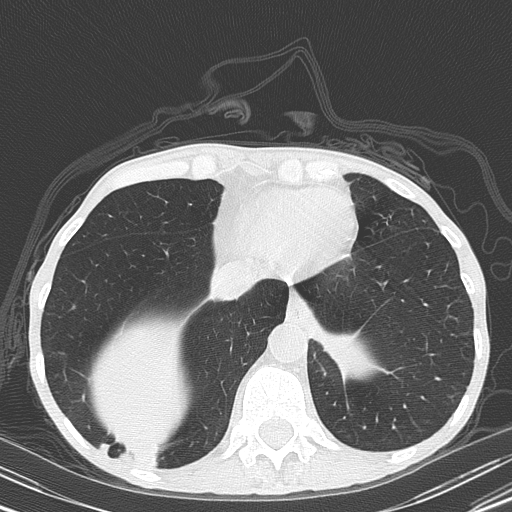
[im 66/180  mediastinal]
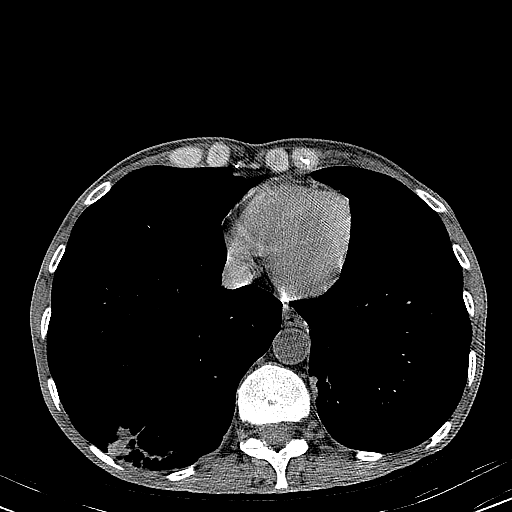
[im 66/180  lung]
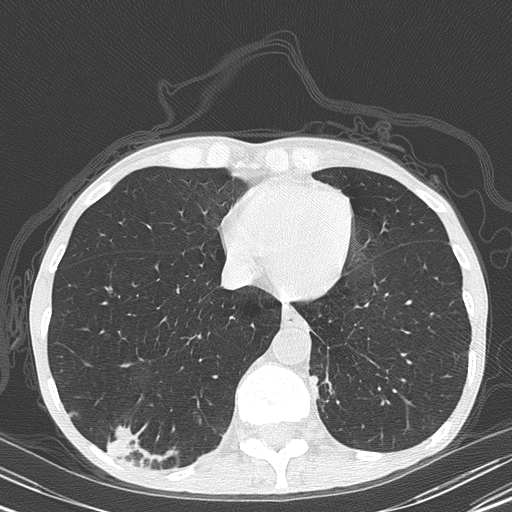
[im 85/180  lung]
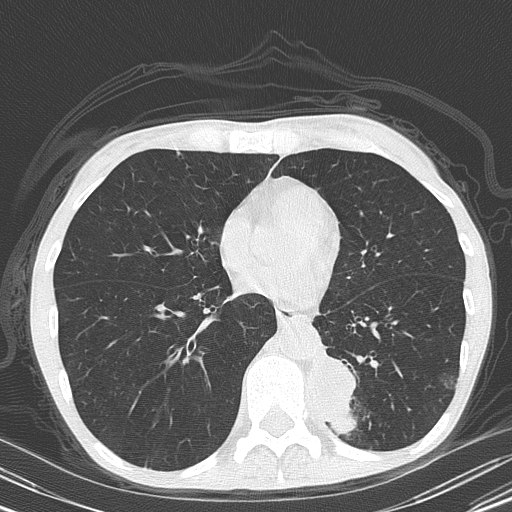
[im 95/180  lung]
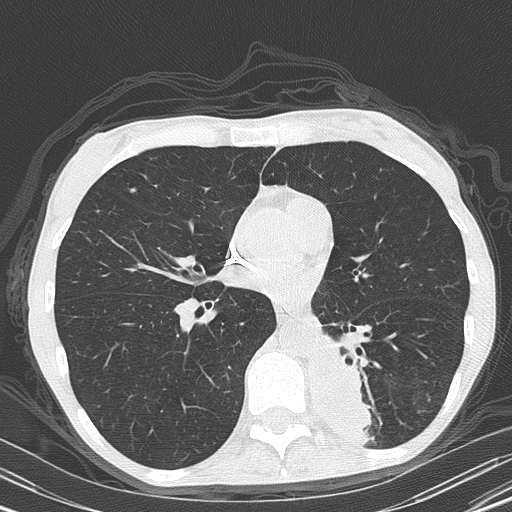
[im 114/180  lung]
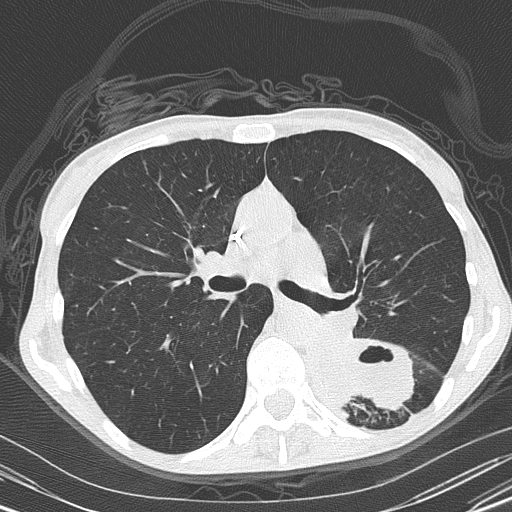
[im 123/180  mediastinal]
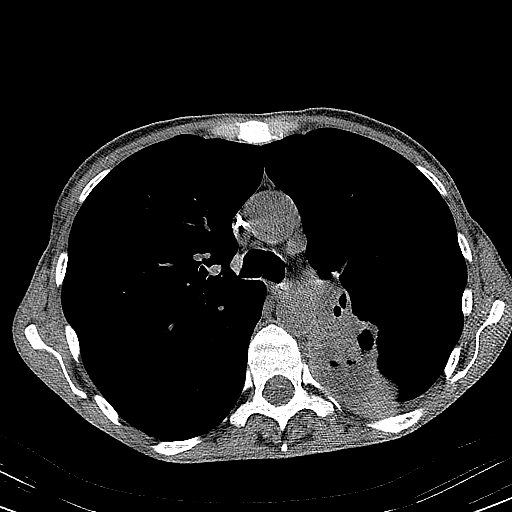
[im 123/180  lung]
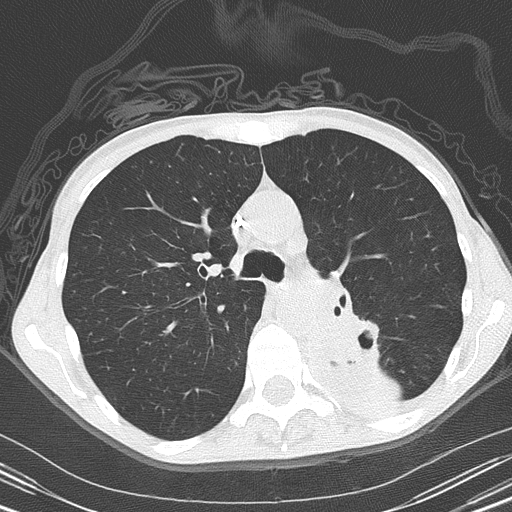
[im 142/180  lung]
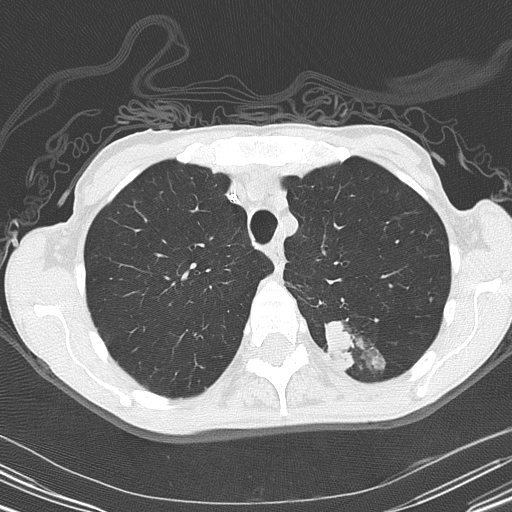
[im 151/180  lung]
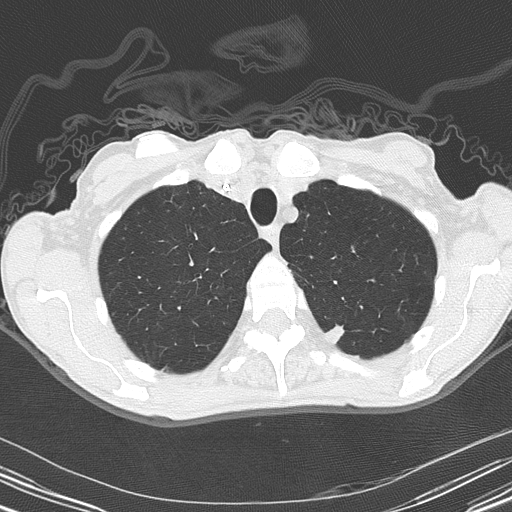
[im 170/180  lung]
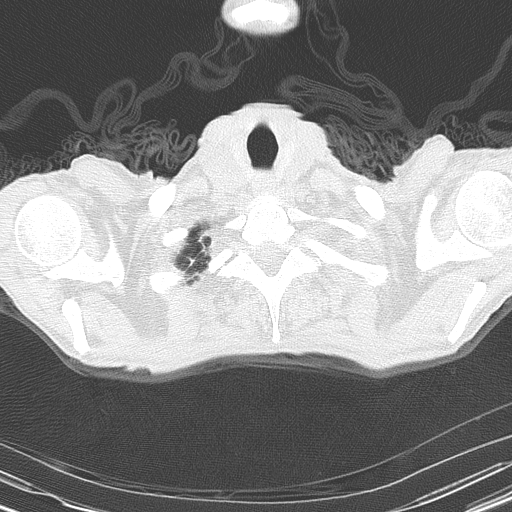

[Series 602: coronal · coronal · 0.70mm/px · 3 of 84 slices shown]
[im 17/84  lung]
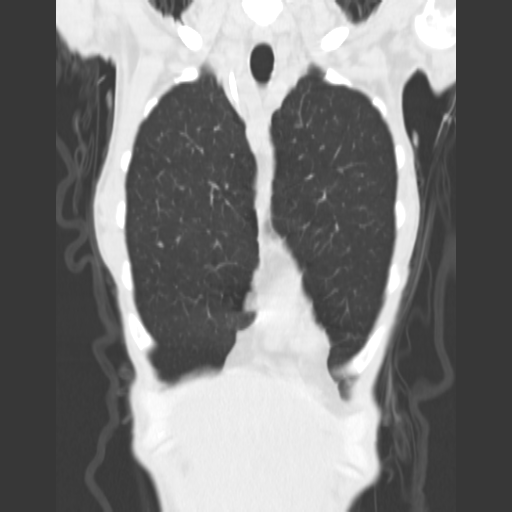
[im 34/84  lung]
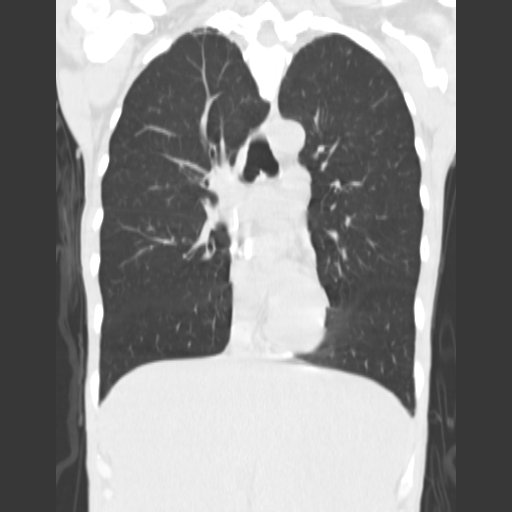
[im 50/84  lung]
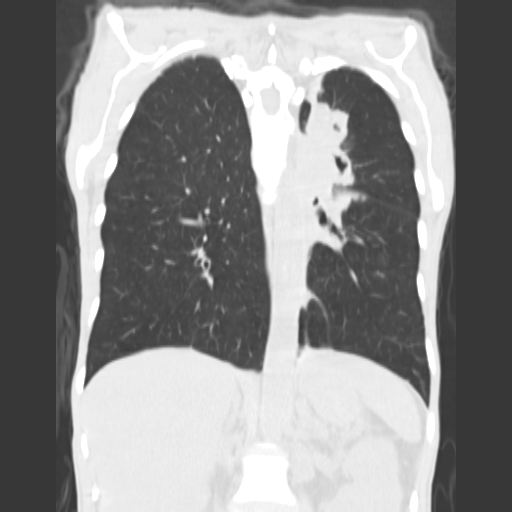

[15 of 36 positions shown; findings below may reference images not displayed]

FINDINGS: Cardiovascular: No significant vascular findings. Normal heart size.
No pericardial effusion.

Mediastinum/Nodes: No axillary supraclavicular adenopathy. No
adenopathy identified on this noncontrast exam.

Lungs/Pleura: There is interval increase in nodularity along the
superior aspect of the cavitary lesion LEFT upper lobe. For example
2.9 by 2.6 cm nodular mass is increased from 2.2 x 1.0 cm (image 45,
series 3). Increased nodular consolidation within the LEFT lower
lobe with 4.1 by 3.1 cm mass increased from 2.6 by 2.0 cm nodule
(image 70, series 3). Several small sub cm nodules in the RIGHT
middle lobe (Image 81 and 87 of series 3) not seen on prior. Nodule
pleural parenchymal thickening at the RIGHT lung base (image 122,
series 3 is similar).

Upper Abdomen: Metastasis are not well demonstrated on this
noncontrast exam. There is suggestive of progression of the LEFT
hepatic lobe lesion measuring 6.5 cm increased from 3.4 cm. Renal
metastasis not well demonstrated

Musculoskeletal: No aggressive osseous lesion.
IMPRESSION: 1. Recurrence / progression of lung cancer with increased nodular
and masslike consolidation in the LEFT upper lobe and LEFT lower
lobe.
2. Several new small nodules in the RIGHT middle lobe.
3. Progression of hepatic metastasis.
4. Renal metastatic lesions not well demonstrated.
